# Patient Record
Sex: Female | Born: 1960 | Race: White | Hispanic: No | Marital: Single | Smoking: Never smoker
Health system: Southern US, Community
[De-identification: ages and names within clinical notes are randomized; demographics above are authoritative.]

## PROBLEM LIST (undated history)

## (undated) DIAGNOSIS — F419 Anxiety disorder, unspecified: Secondary | ICD-10-CM

## (undated) DIAGNOSIS — I10 Essential (primary) hypertension: Secondary | ICD-10-CM

## (undated) DIAGNOSIS — K219 Gastro-esophageal reflux disease without esophagitis: Secondary | ICD-10-CM

## (undated) DIAGNOSIS — R011 Cardiac murmur, unspecified: Secondary | ICD-10-CM

## (undated) DIAGNOSIS — T7840XA Allergy, unspecified, initial encounter: Secondary | ICD-10-CM

## (undated) DIAGNOSIS — E7800 Pure hypercholesterolemia, unspecified: Secondary | ICD-10-CM

## (undated) DIAGNOSIS — R945 Abnormal results of liver function studies: Secondary | ICD-10-CM

## (undated) DIAGNOSIS — N189 Chronic kidney disease, unspecified: Secondary | ICD-10-CM

## (undated) DIAGNOSIS — E119 Type 2 diabetes mellitus without complications: Secondary | ICD-10-CM

## (undated) DIAGNOSIS — R739 Hyperglycemia, unspecified: Secondary | ICD-10-CM

## (undated) DIAGNOSIS — D649 Anemia, unspecified: Secondary | ICD-10-CM

## (undated) DIAGNOSIS — F32A Depression, unspecified: Secondary | ICD-10-CM

## (undated) HISTORY — PX: BREAST BIOPSY: SHX20

## (undated) HISTORY — DX: Hyperglycemia, unspecified: R73.9

## (undated) HISTORY — DX: Pure hypercholesterolemia, unspecified: E78.00

## (undated) HISTORY — DX: Gastro-esophageal reflux disease without esophagitis: K21.9

## (undated) HISTORY — DX: Anxiety disorder, unspecified: F41.9

## (undated) HISTORY — DX: Anemia, unspecified: D64.9

## (undated) HISTORY — DX: Depression, unspecified: F32.A

## (undated) HISTORY — PX: SPINE SURGERY: SHX786

## (undated) HISTORY — PX: BREAST EXCISIONAL BIOPSY: SUR124

## (undated) HISTORY — DX: Essential (primary) hypertension: I10

## (undated) HISTORY — DX: Cardiac murmur, unspecified: R01.1

## (undated) HISTORY — DX: Chronic kidney disease, unspecified: N18.9

## (undated) HISTORY — DX: Allergy, unspecified, initial encounter: T78.40XA

## (undated) HISTORY — DX: Abnormal results of liver function studies: R94.5

## (undated) HISTORY — DX: Type 2 diabetes mellitus without complications: E11.9

---
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
## 1988-09-15 HISTORY — PX: SEPTOPLASTY: SUR1290

## 2002-09-15 HISTORY — PX: LUMBAR MICRODISCECTOMY: SHX99

## 2003-02-06 ENCOUNTER — Encounter: Payer: Self-pay | Admitting: Neurological Surgery

## 2003-02-06 ENCOUNTER — Ambulatory Visit (HOSPITAL_COMMUNITY): Admission: RE | Admit: 2003-02-06 | Discharge: 2003-02-07 | Payer: Self-pay | Admitting: Neurological Surgery

## 2004-06-17 ENCOUNTER — Ambulatory Visit: Payer: Self-pay | Admitting: Internal Medicine

## 2004-09-15 HISTORY — PX: ROTATOR CUFF REPAIR: SHX139

## 2004-10-17 ENCOUNTER — Ambulatory Visit: Payer: Self-pay | Admitting: Internal Medicine

## 2005-01-27 ENCOUNTER — Ambulatory Visit (HOSPITAL_COMMUNITY): Admission: RE | Admit: 2005-01-27 | Discharge: 2005-01-27 | Payer: Self-pay | Admitting: Orthopedic Surgery

## 2005-01-27 ENCOUNTER — Ambulatory Visit (HOSPITAL_BASED_OUTPATIENT_CLINIC_OR_DEPARTMENT_OTHER): Admission: RE | Admit: 2005-01-27 | Discharge: 2005-01-27 | Payer: Self-pay | Admitting: Orthopedic Surgery

## 2005-06-24 ENCOUNTER — Ambulatory Visit: Payer: Self-pay | Admitting: Internal Medicine

## 2005-09-18 ENCOUNTER — Ambulatory Visit: Payer: Self-pay | Admitting: Gastroenterology

## 2006-01-08 ENCOUNTER — Ambulatory Visit: Payer: Self-pay | Admitting: Internal Medicine

## 2006-07-01 ENCOUNTER — Ambulatory Visit: Payer: Self-pay | Admitting: General Surgery

## 2006-07-14 ENCOUNTER — Ambulatory Visit: Payer: Self-pay | Admitting: General Surgery

## 2006-07-30 HISTORY — PX: BREAST BIOPSY: SHX20

## 2006-09-15 HISTORY — PX: BREAST SURGERY: SHX581

## 2007-02-01 ENCOUNTER — Ambulatory Visit: Payer: Self-pay | Admitting: General Surgery

## 2007-02-26 ENCOUNTER — Ambulatory Visit: Payer: Self-pay | Admitting: General Surgery

## 2007-03-05 ENCOUNTER — Ambulatory Visit: Payer: Self-pay | Admitting: General Surgery

## 2007-07-20 ENCOUNTER — Ambulatory Visit: Payer: Self-pay | Admitting: General Surgery

## 2007-09-16 HISTORY — PX: ACHILLES TENDON REPAIR: SUR1153

## 2008-07-20 ENCOUNTER — Ambulatory Visit: Payer: Self-pay | Admitting: Internal Medicine

## 2008-09-15 HISTORY — PX: ABDOMINAL HYSTERECTOMY: SHX81

## 2009-03-16 ENCOUNTER — Ambulatory Visit: Payer: Self-pay | Admitting: Internal Medicine

## 2009-08-10 ENCOUNTER — Ambulatory Visit: Payer: Self-pay | Admitting: Internal Medicine

## 2010-05-16 ENCOUNTER — Ambulatory Visit: Payer: Self-pay | Admitting: Internal Medicine

## 2010-07-23 ENCOUNTER — Ambulatory Visit: Payer: Self-pay | Admitting: Obstetrics and Gynecology

## 2010-07-29 ENCOUNTER — Ambulatory Visit: Payer: Self-pay | Admitting: Obstetrics and Gynecology

## 2010-07-30 LAB — PATHOLOGY REPORT

## 2010-09-03 ENCOUNTER — Ambulatory Visit: Payer: Self-pay | Admitting: Internal Medicine

## 2011-04-25 ENCOUNTER — Ambulatory Visit: Payer: Self-pay | Admitting: Gastroenterology

## 2011-09-11 ENCOUNTER — Ambulatory Visit: Payer: Self-pay | Admitting: Internal Medicine

## 2011-09-29 ENCOUNTER — Ambulatory Visit: Payer: Self-pay | Admitting: Internal Medicine

## 2012-06-18 ENCOUNTER — Ambulatory Visit (INDEPENDENT_AMBULATORY_CARE_PROVIDER_SITE_OTHER): Payer: BC Managed Care – PPO | Admitting: Internal Medicine

## 2012-06-18 ENCOUNTER — Encounter: Payer: Self-pay | Admitting: Internal Medicine

## 2012-06-18 VITALS — BP 118/68 | HR 100 | Temp 98.8°F | Ht 63.0 in | Wt 240.5 lb

## 2012-06-18 DIAGNOSIS — R748 Abnormal levels of other serum enzymes: Secondary | ICD-10-CM

## 2012-06-18 DIAGNOSIS — I10 Essential (primary) hypertension: Secondary | ICD-10-CM

## 2012-06-18 DIAGNOSIS — Z0000 Encounter for general adult medical examination without abnormal findings: Secondary | ICD-10-CM

## 2012-06-18 DIAGNOSIS — R739 Hyperglycemia, unspecified: Secondary | ICD-10-CM

## 2012-06-18 DIAGNOSIS — J329 Chronic sinusitis, unspecified: Secondary | ICD-10-CM

## 2012-06-18 DIAGNOSIS — E7800 Pure hypercholesterolemia, unspecified: Secondary | ICD-10-CM

## 2012-06-18 DIAGNOSIS — R7309 Other abnormal glucose: Secondary | ICD-10-CM

## 2012-06-18 DIAGNOSIS — R945 Abnormal results of liver function studies: Secondary | ICD-10-CM

## 2012-06-18 DIAGNOSIS — K769 Liver disease, unspecified: Secondary | ICD-10-CM

## 2012-06-18 MED ORDER — SULFAMETHOXAZOLE-TMP DS 800-160 MG PO TABS: 1.0000 | ORAL_TABLET | Freq: Two times a day (BID) | ORAL | Status: DC

## 2012-06-18 MED ORDER — LISINOPRIL 10 MG PO TABS: 10.0000 mg | ORAL_TABLET | Freq: Every day | ORAL | Status: DC

## 2012-06-18 MED ORDER — ATORVASTATIN CALCIUM 10 MG PO TABS: 10.0000 mg | ORAL_TABLET | Freq: Every day | ORAL | Status: DC

## 2012-06-18 NOTE — Patient Instructions (Signed)
It was good seeing you today.  I am going to give you an antibiotic (Bactrim) to take 2x/day.  Use Flonase Nasal spray daily as directed.  Flush with saline nasal spray at least 2-3x/day.  Mucinex in the am and Plain Robitussin in the evening.  Rest.  Fluids.  If symptoms do not resolve, let me know.

## 2012-06-20 ENCOUNTER — Encounter: Payer: Self-pay | Admitting: Internal Medicine

## 2012-06-20 DIAGNOSIS — I10 Essential (primary) hypertension: Secondary | ICD-10-CM | POA: Insufficient documentation

## 2012-06-20 NOTE — Progress Notes (Signed)
  Subjective:    [name], female    DOB: 1960-10-21, 51 y.o.   MRN: [id number]  HPI 51 year old female with past history of hypertension, hypercholesterolemia and abnormal liver function who comes in today for a scheduled follow up.  She states she has been doing relatively well overall.  She has been having some allergy issues over the last two months.  Taking Claritin.  Over the last couple of days, she has developed a sore throat, increased nasal congestion and increased sinus pressure.  Fever - Tmax (100.5).  Yellow/green nasal mucus production.  Minimal cough.  No deep chest congestion.  No chest tightness, sob or wheezing.  She has been flushing her nose with saline.    Review of Systems Patient denies any headache, lightheadedness or dizziness.  She does report some increased sinus pressure and nasal congestion - productive of green mucus.  Symptoms consistent with her previous sinus infections.  Some ear fullness.  No sore throat.  No chest pain, tightness or palpatations.  No increased shortness of breath or chest congestion.  Minimal cough.  No nausea or vomiting.  No abdominal pain or cramping.  No bowel change, such as diarrhea, constipation, BRBPR or melana.  No urine change.      Past Medical History  Diagnosis Date  . Asthma   . Depression   . Allergy   . Heart murmur   . Elevated blood pressure   . Hyperlipidemia   . Urinary tract infection   . GERD (gastroesophageal reflux disease)        Objective:   Physical Exam Filed Vitals:   06/18/12 0843  BP: 118/68  Pulse: 100  Temp: 98.8 F (52.50 C)   51 year old female in no acute distress.   HEENT:  Nares - clear except slightly erythematous turbinates.  OP- without lesions or erythema.  Minimal maxillary sinus pressure - with palpation.   NECK:  Supple, nontender.  No audible carotid bruit.   HEART:  Appears to be regular. LUNGS:  Without crackles or wheezing audible.  Respirations even and  unlabored.   RADIAL PULSE:  Equal bilaterally.  ABDOMEN:  Soft, nontender.  No audible abdominal bruit.   EXTREMITIES:  No increased edema to be present.                     Assessment & Plan:  SINUSITIS.  Treat with Bactrim DS bid x 10 days.  Saline nasal spray and Flonase nasal spray as directed.  Mucinex in the am and Robitussin in the evening.  Rest.  Fluids.  Explained to her if symptoms changed, worsened or did not resolve - she was to be reevaluated.    INCREASED PSYCHOSOCIAL STRESSORS.  Doing well.  Follow.

## 2012-06-20 NOTE — Assessment & Plan Note (Signed)
Well controlled.  Continue same meds.  Follow metabolic panel.

## 2012-06-21 ENCOUNTER — Encounter: Payer: Self-pay | Admitting: Internal Medicine

## 2012-06-21 DIAGNOSIS — E119 Type 2 diabetes mellitus without complications: Secondary | ICD-10-CM | POA: Insufficient documentation

## 2012-06-21 DIAGNOSIS — E7800 Pure hypercholesterolemia, unspecified: Secondary | ICD-10-CM | POA: Insufficient documentation

## 2012-06-21 DIAGNOSIS — R945 Abnormal results of liver function studies: Secondary | ICD-10-CM | POA: Insufficient documentation

## 2012-06-21 DIAGNOSIS — E1165 Type 2 diabetes mellitus with hyperglycemia: Secondary | ICD-10-CM | POA: Insufficient documentation

## 2012-06-21 DIAGNOSIS — K7689 Other specified diseases of liver: Secondary | ICD-10-CM | POA: Insufficient documentation

## 2012-06-21 NOTE — Assessment & Plan Note (Signed)
Review notes to see when last liver panel obtained.  Follow.

## 2012-06-21 NOTE — Assessment & Plan Note (Signed)
Low cholesterol diet and exercise.  Recent lipid profile revealed total cholesterol 176, HDL 43, LDL 103 and triglycerides 153.  Follow.

## 2012-06-21 NOTE — Assessment & Plan Note (Signed)
Low carb diet, exercise and weight loss.  Recent a1c 6.4.  Follow.

## 2012-06-24 ENCOUNTER — Encounter: Payer: Self-pay | Admitting: Internal Medicine

## 2012-06-24 MED ORDER — SULFAMETHOXAZOLE-TRIMETHOPRIM 800-160 MG PO TABS: 1.0000 | ORAL_TABLET | Freq: Two times a day (BID) | ORAL | Status: DC

## 2012-06-24 NOTE — Telephone Encounter (Signed)
I prescribed another one week of bactrim - rx sent to [facility]

## 2012-08-17 ENCOUNTER — Encounter: Payer: Self-pay | Admitting: Internal Medicine

## 2012-08-17 DIAGNOSIS — I10 Essential (primary) hypertension: Secondary | ICD-10-CM

## 2012-08-20 ENCOUNTER — Encounter: Payer: Self-pay | Admitting: Internal Medicine

## 2012-08-20 ENCOUNTER — Other Ambulatory Visit (HOSPITAL_COMMUNITY)
Admission: RE | Admit: 2012-08-20 | Discharge: 2012-08-20 | Disposition: A | Discharge status: Home / Self Care | Payer: BC Managed Care – PPO | Source: Ambulatory Visit | Attending: Internal Medicine | Admitting: Internal Medicine

## 2012-08-20 ENCOUNTER — Ambulatory Visit (INDEPENDENT_AMBULATORY_CARE_PROVIDER_SITE_OTHER): Payer: BC Managed Care – PPO | Admitting: Internal Medicine

## 2012-08-20 VITALS — BP 120/70 | HR 82 | Temp 98.2°F | Ht 62.0 in | Wt 232.2 lb

## 2012-08-20 DIAGNOSIS — K769 Liver disease, unspecified: Secondary | ICD-10-CM

## 2012-08-20 DIAGNOSIS — R7309 Other abnormal glucose: Secondary | ICD-10-CM

## 2012-08-20 DIAGNOSIS — E7800 Pure hypercholesterolemia, unspecified: Secondary | ICD-10-CM

## 2012-08-20 DIAGNOSIS — R739 Hyperglycemia, unspecified: Secondary | ICD-10-CM

## 2012-08-20 DIAGNOSIS — I10 Essential (primary) hypertension: Secondary | ICD-10-CM

## 2012-08-20 DIAGNOSIS — R7989 Other specified abnormal findings of blood chemistry: Secondary | ICD-10-CM

## 2012-08-20 DIAGNOSIS — Z139 Encounter for screening, unspecified: Secondary | ICD-10-CM

## 2012-08-20 DIAGNOSIS — R5383 Other fatigue: Secondary | ICD-10-CM

## 2012-08-20 DIAGNOSIS — D649 Anemia, unspecified: Secondary | ICD-10-CM | POA: Insufficient documentation

## 2012-08-20 DIAGNOSIS — Z01419 Encounter for gynecological examination (general) (routine) without abnormal findings: Secondary | ICD-10-CM | POA: Insufficient documentation

## 2012-08-20 DIAGNOSIS — R945 Abnormal results of liver function studies: Secondary | ICD-10-CM

## 2012-08-20 DIAGNOSIS — Z1151 Encounter for screening for human papillomavirus (HPV): Secondary | ICD-10-CM | POA: Insufficient documentation

## 2012-08-20 DIAGNOSIS — R5381 Other malaise: Secondary | ICD-10-CM

## 2012-08-20 LAB — CBC WITH DIFFERENTIAL/PLATELET
Basophils Absolute: 0 10*3/uL (ref 0.0–0.1)
Basophils Relative: 0.9 % (ref 0.0–3.0)
Eosinophils Absolute: 0.1 10*3/uL (ref 0.0–0.7)
Hemoglobin: 14.8 g/dL (ref 12.0–15.0)
MCHC: 33.1 g/dL (ref 30.0–36.0)
MCV: 84.6 fl (ref 78.0–100.0)
Monocytes Absolute: 0.3 10*3/uL (ref 0.1–1.0)
Neutro Abs: 2.6 10*3/uL (ref 1.4–7.7)
RBC: 5.29 Mil/uL — ABNORMAL HIGH (ref 3.87–5.11)
RDW: 13.7 % (ref 11.5–14.6)

## 2012-08-20 LAB — BASIC METABOLIC PANEL
BUN: 16 mg/dL (ref 6–23)
Calcium: 9.5 mg/dL (ref 8.4–10.5)
Creatinine, Ser: 0.9 mg/dL (ref 0.4–1.2)
GFR: 70.85 mL/min (ref >60.00)

## 2012-08-20 LAB — HEMOGLOBIN A1C: Hgb A1c MFr Bld: 6.1 % (ref 4.6–6.5)

## 2012-08-20 LAB — HEPATIC FUNCTION PANEL
Albumin: 4.5 g/dL (ref 3.5–5.2)
Alkaline Phosphatase: 62 U/L (ref 39–117)
Total Protein: 7.7 g/dL (ref 6.0–8.3)

## 2012-08-20 LAB — LIPID PANEL
Cholesterol: 184 mg/dL (ref 0–200)
HDL: 38.6 mg/dL — ABNORMAL LOW (ref >39.00)
Triglycerides: 133 mg/dL (ref 0.0–149.0)

## 2012-08-20 LAB — TSH: TSH: 1.67 u[IU]/mL (ref 0.35–5.50)

## 2012-08-20 NOTE — Assessment & Plan Note (Signed)
On iron.  Follow cbc and ferritin.

## 2012-08-20 NOTE — Patient Instructions (Addendum)
It was good seeing you today.  Let me know if you need anything.

## 2012-08-20 NOTE — Progress Notes (Signed)
Subjective:    [name], female    DOB: 11-06-1960, 51 y.o.   MRN: [id number]  HPI 51 year old female with past history of hypertension, hypercholesterolemia and abnormal liver function who comes in today to follow up on these issues as well as for a complete physical exam.  States she is doing relatively well.  Mother is declining and she reports increased stress related to this.  Feels she is handling things relatively well.  Eating and drinking well.  No nausea or vomiting.  Bowels stable.   Past Medical History  Diagnosis Date  . Abnormal liver function   . Allergy   . Heart murmur   . Hypertension   . Hypercholesterolemia   . GERD (gastroesophageal reflux disease)   . Hyperglycemia   . Anemia     Current Outpatient Prescriptions on File Prior to Visit  Medication Sig Dispense Refill  . adapalene (DIFFERIN) 0.1 % gel Apply 0.3 % topically at bedtime.      Marland Kitchen atorvastatin (LIPITOR) 10 MG tablet Take 1 tablet (10 mg total) by mouth daily.  90 tablet  3  . Benzoyl Peroxide (BENZEFOAM EX) Apply topically.      . citalopram (CELEXA) 20 MG tablet Take 1/2 a day      . Clobetasol Propionate (CLOBEX SPRAY EX) Apply topically.      Marland Kitchen doxycycline (PERIOSTAT) 20 MG tablet Take 20 mg by mouth 2 (two) times daily.      Marland Kitchen lisinopril (PRINIVIL,ZESTRIL) 10 MG tablet Take 1 tablet (10 mg total) by mouth daily.  90 tablet  3  . vitamin E 400 UNIT capsule Take 400 Units by mouth daily.      Marland Kitchen loratadine (CLARITIN) 10 MG tablet Take 10 mg by mouth daily.        Review of Systems Patient denies any headache, lightheadedness or dizziness.  No sinus or allergy symptoms.  Previous sinus infection - cleared.  No chest pain, tightness or palpitations.  No increased shortness of breath, cough or congestion.  No nausea or vomiting.  No abdominal pain or cramping.  No bowel change, such as diarrhea, constipation, BRBPR or melana.  No urine change.        Objective:   Physical Exam Filed  Vitals:   08/20/12 1001  BP: 120/70  Pulse: 82  Temp: 98.2 F (36.8 C)   Blood pressure recheck:  44/71  51 year old female in no acute distress.   HEENT:  Nares- clear.  Oropharynx - without lesions. NECK:  Supple.  Nontender.  No audible bruit.  HEART:  Appears to be regular. LUNGS:  No crackles or wheezing audible.  Respirations even and unlabored.  RADIAL PULSE:  Equal bilaterally.    BREASTS:  No nipple discharge or nipple retraction present.  Could not appreciate any distinct nodules or axillary adenopathy.  ABDOMEN:  Soft, nontender.  Bowel sounds present and normal.  No audible abdominal bruit.  GU:  Normal external genitalia.  Vaginal vault without lesions.  S/p hysterectomy.  Pap performed. Could not appreciate any adnexal masses or tenderness.   RECTAL:  Heme negative.   EXTREMITIES:  No increased edema present.  DP pulses palpable and equal bilaterally.          Assessment & Plan:  INCREASED PSYCHOSOCIAL STRESSORS.  Doing well on Celexa.  Increased stress dealing with her mother's medical issues.  Does not feel she needs anything more at this point.  Follow.    GYN.  S/p [place] and doing well.  Released by Dr Amalia Hailey.    CARDIOVASCULAR.  Asymptomatic.  Continue risk factor modification.   PREVIOUS HEMATURIA.  U/A 09/18/11 revealed no rbc's.  Follow.    HEALTH MAINTENANCE.  Physical today.  Mammogram 09/11/11 recommended follow up views.  Follow up views 09/19/11 - BiRADS II.  Schedule a follow up mammogram.

## 2012-08-21 ENCOUNTER — Encounter: Payer: Self-pay | Admitting: Internal Medicine

## 2012-08-21 ENCOUNTER — Telehealth: Payer: Self-pay | Admitting: Internal Medicine

## 2012-08-21 DIAGNOSIS — R7989 Other specified abnormal findings of blood chemistry: Secondary | ICD-10-CM

## 2012-08-21 DIAGNOSIS — R945 Abnormal results of liver function studies: Secondary | ICD-10-CM

## 2012-08-21 DIAGNOSIS — D72819 Decreased white blood cell count, unspecified: Secondary | ICD-10-CM

## 2012-08-21 NOTE — Assessment & Plan Note (Signed)
Blood pressure under good control.  Continue same medication.  Check metabolic panel.

## 2012-08-21 NOTE — Telephone Encounter (Signed)
I sent Carly Fields a my chart message regarding her lab results.  She needs nonfasting labs scheduled in 2 months.  Please make her a lab appt and send to her.  She has not been scheduled an appt.  I will place order for the labs.  Thanks.

## 2012-08-21 NOTE — Assessment & Plan Note (Signed)
Follows with Dr Donnella Sham.  Declined liver biopsy.  Check liver panel.

## 2012-08-21 NOTE — Assessment & Plan Note (Signed)
On Lipitor.  Low cholesterol diet and exercise.  Check lipid panel and liver function.

## 2012-08-21 NOTE — Assessment & Plan Note (Addendum)
Low carb diet.  Check metabolic panel and H9V.

## 2012-08-24 NOTE — Telephone Encounter (Signed)
Made appointment and mailed appointment card to pt 12/10

## 2012-09-13 ENCOUNTER — Ambulatory Visit: Payer: Self-pay | Admitting: Internal Medicine

## 2012-09-15 ENCOUNTER — Encounter: Payer: Self-pay | Admitting: Internal Medicine

## 2012-10-05 ENCOUNTER — Encounter: Payer: Self-pay | Admitting: Internal Medicine

## 2012-10-27 ENCOUNTER — Telehealth: Payer: Self-pay | Admitting: Internal Medicine

## 2012-10-27 ENCOUNTER — Other Ambulatory Visit (INDEPENDENT_AMBULATORY_CARE_PROVIDER_SITE_OTHER): Payer: BC Managed Care – PPO

## 2012-10-27 DIAGNOSIS — D72819 Decreased white blood cell count, unspecified: Secondary | ICD-10-CM

## 2012-10-27 DIAGNOSIS — R945 Abnormal results of liver function studies: Secondary | ICD-10-CM

## 2012-10-27 DIAGNOSIS — R7989 Other specified abnormal findings of blood chemistry: Secondary | ICD-10-CM

## 2012-10-27 LAB — CBC WITH DIFFERENTIAL/PLATELET
Basophils Absolute: 0 10*3/uL (ref 0.0–0.1)
Eosinophils Relative: 2.8 % (ref 0.0–5.0)
HCT: 40.5 % (ref 36.0–46.0)
Lymphocytes Relative: 26 % (ref 12.0–46.0)
Lymphs Abs: 1.3 10*3/uL (ref 0.7–4.0)
Monocytes Relative: 8.1 % (ref 3.0–12.0)
Neutrophils Relative %: 62.4 % (ref 43.0–77.0)
Platelets: 230 10*3/uL (ref 150.0–400.0)
RDW: 13.4 % (ref 11.5–14.6)
WBC: 4.9 10*3/uL (ref 4.5–10.5)

## 2012-10-27 LAB — HEPATIC FUNCTION PANEL
ALT: 75 U/L — ABNORMAL HIGH (ref 0–35)
AST: 48 U/L — ABNORMAL HIGH (ref 0–37)
Alkaline Phosphatase: 61 U/L (ref 39–117)
Bilirubin, Direct: 0.1 mg/dL (ref 0.0–0.3)
Total Bilirubin: 0.5 mg/dL (ref 0.3–1.2)

## 2012-10-27 NOTE — Telephone Encounter (Signed)
Pt notified of labs via my chart messaging.

## 2012-10-30 ENCOUNTER — Other Ambulatory Visit: Payer: Self-pay

## 2012-11-24 ENCOUNTER — Telehealth: Payer: Self-pay | Admitting: Internal Medicine

## 2012-11-24 ENCOUNTER — Encounter: Payer: Self-pay | Admitting: Adult Health

## 2012-11-24 ENCOUNTER — Ambulatory Visit (INDEPENDENT_AMBULATORY_CARE_PROVIDER_SITE_OTHER): Payer: BC Managed Care – PPO | Admitting: Adult Health

## 2012-11-24 VITALS — BP 124/80 | HR 78 | Temp 98.9°F | Resp 16 | Wt 237.5 lb

## 2012-11-24 DIAGNOSIS — L409 Psoriasis, unspecified: Secondary | ICD-10-CM | POA: Insufficient documentation

## 2012-11-24 DIAGNOSIS — J329 Chronic sinusitis, unspecified: Secondary | ICD-10-CM

## 2012-11-24 MED ORDER — CEFUROXIME AXETIL 250 MG PO TABS: 250.0000 mg | ORAL_TABLET | Freq: Two times a day (BID) | ORAL | Status: DC

## 2012-11-24 NOTE — Progress Notes (Signed)
  Subjective:    [name], female    DOB: [birth year], 52 y.o.   MRN: [id number]  HPI  Patient is a pleasant 52 y/o female who presents to clinic with upper respiratory symptoms. She began having symptoms on Sunday consisting of cough, congestion, sinus pressure, sore throat, fever. She has been trying over-the-counter Mucinex day and night but does not feel that this is helping. She denies shortness of breath, wheezing or chest pain.  Current Outpatient Prescriptions on File Prior to Visit  Medication Sig Dispense Refill  . adapalene (DIFFERIN) 0.1 % gel Apply 0.3 % topically at bedtime.      Marland Kitchen atorvastatin (LIPITOR) 10 MG tablet Take 1 tablet (10 mg total) by mouth daily.  90 tablet  3  . Benzoyl Peroxide (BENZEFOAM EX) Apply topically.      . Clobetasol Propionate (CLOBEX SPRAY EX) Apply topically daily as needed.       . doxycycline (PERIOSTAT) 20 MG tablet Take 20 mg by mouth 2 (two) times daily.      Marland Kitchen lisinopril (PRINIVIL,ZESTRIL) 10 MG tablet Take 1 tablet (10 mg total) by mouth daily.  90 tablet  3  . vitamin E 400 UNIT capsule Take 400 Units by mouth daily.      . citalopram (CELEXA) 20 MG tablet Take 1/2 a day      . loratadine (CLARITIN) 10 MG tablet Take 10 mg by mouth daily.       No current facility-administered medications on file prior to visit.     Review of Systems  Constitutional: Positive for fever and chills.  HENT: Positive for congestion, sore throat, rhinorrhea, postnasal drip and sinus pressure.        Green drainage  Respiratory: Positive for cough. Negative for shortness of breath and wheezing.   Cardiovascular: Negative for chest pain.  Gastrointestinal: Negative for nausea, vomiting and diarrhea.   BP 124/80  Pulse 78  Temp(Src) 98.9 F (37.2 C) (Oral)  Resp 16  Wt 237 lb 8 oz (107.729 kg)  BMI 43.43 kg/m2  SpO2 98%     Objective:   Physical Exam  Constitutional: She is oriented to person, place, and time. She appears  well-developed and well-nourished. No distress.  Sick appearing. She is wearing a face mask. Patient sounds very nasally.  HENT:  Head: Normocephalic and atraumatic.  Pharyngeal erythema without exudate  Cardiovascular: Normal rate, regular rhythm, normal heart sounds and intact distal pulses.  Exam reveals no gallop.   No murmur heard. Pulmonary/Chest: Effort normal and breath sounds normal. No respiratory distress. She has no wheezes. She has no rales.  Lymphadenopathy:    She has cervical adenopathy.  Neurological: She is alert and oriented to person, place, and time.  Skin: Skin is warm and dry.  Psychiatric: She has a normal mood and affect. Her behavior is normal. Judgment and thought content normal.          Assessment & Plan:

## 2012-11-24 NOTE — Telephone Encounter (Signed)
Patient Information:  Caller Name: Elveta  Phone: [phone]  Patient: Carly Fields, Harding  Gender: Female  DOB: 1961/08/20  Age: 52 Years  PCP: Einar Pheasant  Pregnant: No  Office Follow Up:  Does the office need to follow up with this patient?: No  Instructions For The Office: N/A   Symptoms  Reason For Call & Symptoms: Patient states she has sore throat and sinus pressure and pain and cough.  Reviewed Health History In EMR: Yes  Reviewed Medications In EMR: Yes  Reviewed Allergies In EMR: Yes  Reviewed Surgeries / Procedures: Yes  Date of Onset of Symptoms: 11/21/2012  Any Fever: Yes  Fever Taken: Oral  Fever Time Of Reading: 07:45:00  Fever Last Reading: 99.9 OB / GYN:  LMP: Unknown  Guideline(s) Used:  Sinus Pain and Congestion  Disposition Per Guideline:   See Today in Office  Reason For Disposition Reached:   Sinus pain (not just congestion) and fever  Advice Given:  N/A  Appointment Scheduled:  11/24/2012 11:15:00 Appointment Scheduled Provider:  Charolette Forward

## 2012-11-24 NOTE — Patient Instructions (Addendum)
Please start your Ceftin today. You will take this for 10 days.  Also, resume your flonase at bedtime - 2 sprays in each nostril. You can also use afrin for severe congestion for 3 days only.   Below is some information on your condition:  What is sinusitis? - Sinusitis is a condition that can cause a stuffy nose, pain in the face, and yellow or green discharge (mucus) from the nose. The sinuses are hollow areas in the bones of the face. They have a thin lining that normally makes a small amount of mucus. When this lining gets infected, it swells and makes extra mucus. This causes symptoms.   Sinusitis can occur when a person gets sick with a cold. The germs causing the cold can also infect the sinuses. Many times, a person feels like his or her cold is getting better. But then he or she gets sinusitis and begins to feel sick again.  What are the symptoms of sinusitis? - Common symptoms of sinusitis include: Stuffy or blocked nose  Thick yellow or green discharge from the nose  Pain in the teeth  Pain or pressure in the face - This often feels worse when a person bends forward.   People with sinusitis can also have other symptoms that include: Fever  Cough  Trouble smelling  Ear pressure or fullness  Headache  Bad breath  Feeling tired   Most of the time, symptoms start to improve in 7 to 10 days.  Should I see a doctor or nurse? - See your doctor or nurse if your symptoms last more than 7 days, or if your symptoms get better at first but then get worse. Sometimes, sinusitis can lead to serious problems. See your doctor or nurse right away (do not wait 7 days) if you have: Fever higher than 102.1F (39.2C)  Sudden and severe pain in the face and head  Trouble seeing or seeing double  Trouble thinking clearly  Swelling or redness around 1 or both eyes  Trouble breathing or a stiff neck   Is there anything I can do on my own to feel better? - Yes. To reduce your symptoms, you  can: Take an over-the-counter pain reliever to reduce the pain  Rinse your nose and sinuses with salt water a few times a day - Ask your doctor or nurse about the best way to do this.  Use a decongestant nose spray - These sprays are sold in a pharmacy. But do not use decongestant nose sprays for more than 2 to 3 days in a row. Using them more than 3 days in a row can make symptoms worse.   You should NOT take an antihistamine for sinusitis. Common antihistamines include diphenhydramine (sample brand name: Benadryl), chlorpheniramine (sample brand name: Chlor-Trimeton), loratadine (sample brand name: Claritin), and cetirizine (sample brand name: Zyrtec). They can treat allergies, but not sinus infections, and could increase your discomfort by drying the lining of your nose and sinuses, or making you tired.   Your doctor might also prescribe a steroid nose spray to reduce the swelling in your nose. (Steroid nose sprays do not contain the same steroids that athletes take to build muscle.)  How is sinusitis treated? - Most of the time, sinusitis does not need to be treated with antibiotic medicines. This is because most sinusitis is caused by viruses - not bacteria - and antibiotics do not kill viruses. Many people get over sinus infections without antibiotics.  Some people with sinusitis  do need treatment with antibiotics. If your symptoms have not improved after 7 to 10 days, ask your doctor if you should take antibiotics. Your doctor might recommend that you wait 1 more week to see if your symptoms improve. But if you have symptoms such as a fever or a lot of pain, he or she might prescribe antibiotics. It is important to follow your doctor's instructions about taking your antibiotics.

## 2012-11-24 NOTE — Assessment & Plan Note (Signed)
Patient with symptoms suggestive of bacterial infection. Purulent drainage, fever, general malaise. She is allergic to penicillins. She says that she is able to take Ceftin without any problems. Start Ceftin 250 mg twice a day for 10 days. She can also use Flonase nasal spray 2 sprays in each nostril daily. Afrin for severe congestion only for 3 days. Patient can use over-the-counter cough medication such as Robitussin or Delsym. Return to clinic if symptoms do not improve by Monday.

## 2012-12-09 ENCOUNTER — Encounter: Payer: Self-pay | Admitting: Internal Medicine

## 2012-12-11 ENCOUNTER — Telehealth: Payer: Self-pay | Admitting: Internal Medicine

## 2012-12-11 NOTE — Telephone Encounter (Signed)
Response to [employer].

## 2012-12-27 ENCOUNTER — Encounter: Payer: Self-pay | Admitting: Internal Medicine

## 2012-12-27 ENCOUNTER — Ambulatory Visit (INDEPENDENT_AMBULATORY_CARE_PROVIDER_SITE_OTHER): Payer: BC Managed Care – PPO | Admitting: Internal Medicine

## 2012-12-27 VITALS — BP 126/66 | HR 84 | Temp 98.6°F | Resp 18 | Wt 237.2 lb

## 2012-12-27 DIAGNOSIS — R7309 Other abnormal glucose: Secondary | ICD-10-CM

## 2012-12-27 DIAGNOSIS — J329 Chronic sinusitis, unspecified: Secondary | ICD-10-CM

## 2012-12-27 DIAGNOSIS — R739 Hyperglycemia, unspecified: Secondary | ICD-10-CM

## 2012-12-27 DIAGNOSIS — D649 Anemia, unspecified: Secondary | ICD-10-CM

## 2012-12-27 DIAGNOSIS — K769 Liver disease, unspecified: Secondary | ICD-10-CM

## 2012-12-27 DIAGNOSIS — R945 Abnormal results of liver function studies: Secondary | ICD-10-CM

## 2012-12-27 DIAGNOSIS — I10 Essential (primary) hypertension: Secondary | ICD-10-CM

## 2012-12-27 DIAGNOSIS — E7800 Pure hypercholesterolemia, unspecified: Secondary | ICD-10-CM

## 2012-12-27 NOTE — Assessment & Plan Note (Signed)
Resolved.  No symptoms today.

## 2012-12-27 NOTE — Assessment & Plan Note (Signed)
Blood pressure under good control.  Continue same medication.  Check metabolic panel with next labs.

## 2012-12-27 NOTE — Assessment & Plan Note (Signed)
On Lipitor.  Low cholesterol diet and exercise.  Check lipid panel and liver function with next labs.

## 2012-12-27 NOTE — Assessment & Plan Note (Signed)
Low carb diet.  Check metabolic panel and P9I.

## 2012-12-27 NOTE — Progress Notes (Signed)
Subjective:    [name], female    DOB: 02/02/1961, 52 y.o.   MRN: [id number]  HPI 52 year old female with past history of hypertension, hypercholesterolemia and abnormal liver function who comes in today for a scheduled follow up.  States she is doing relatively well.  Mother has been sick and she reports increased stress related to this.  She also is having increased stress with her job.  Is losing her job.  Having to look for another job.  Feels she is handling things relatively well.  Eating and drinking well.  No nausea or vomiting.  Bowels stable.  No sinus or allergy symptoms now.     Past Medical History  Diagnosis Date  . Abnormal liver function   . Allergy   . Heart murmur   . Hypertension   . Hypercholesterolemia   . GERD (gastroesophageal reflux disease)   . Hyperglycemia   . Anemia     Current Outpatient Prescriptions on File Prior to Visit  Medication Sig Dispense Refill  . adapalene (DIFFERIN) 0.1 % gel Apply 0.3 % topically at bedtime.      Marland Kitchen atorvastatin (LIPITOR) 10 MG tablet Take 1 tablet (10 mg total) by mouth daily.  90 tablet  3  . Benzoyl Peroxide (BENZEFOAM EX) Apply topically.      . cefUROXime (CEFTIN) 250 MG tablet Take 1 tablet (250 mg total) by mouth 2 (two) times daily.  20 tablet  0  . citalopram (CELEXA) 20 MG tablet Take 1/2 a day      . Clobetasol Propionate (CLOBEX SPRAY EX) Apply topically daily as needed.       . doxycycline (PERIOSTAT) 20 MG tablet Take 20 mg by mouth 2 (two) times daily.      Marland Kitchen lisinopril (PRINIVIL,ZESTRIL) 10 MG tablet Take 1 tablet (10 mg total) by mouth daily.  90 tablet  3  . vitamin E 400 UNIT capsule Take 400 Units by mouth daily.      Marland Kitchen loratadine (CLARITIN) 10 MG tablet Take 10 mg by mouth daily.      Marland Kitchen Phenyleph-Diphenhyd-GG-APAP (MUCINEX SINUS-MAX DAY/NIGHT PO) Take by mouth as directed.       No current facility-administered medications on file prior to visit.    Review of Systems Patient denies  any headache, lightheadedness or dizziness.  No sinus or allergy symptoms.  Previous sinus infection - cleared.  No chest pain, tightness or palpitations.  No increased shortness of breath, cough or congestion.  No nausea or vomiting.  No acid reflux.  No abdominal pain or cramping.  No bowel change, such as diarrhea, constipation, BRBPR or melana.  No urine change.  Handling stress relatively well.      Objective:   Physical Exam  Filed Vitals:   12/27/12 0805  BP: 126/66  Pulse: 84  Temp: 98.6 F (37 C)  Resp: 18   Blood pressure recheck:  49/28  52 year old female in no acute distress.   HEENT:  Nares- clear.  Oropharynx - without lesions. NECK:  Supple.  Nontender.  No audible bruit.  HEART:  Appears to be regular. LUNGS:  No crackles or wheezing audible.  Respirations even and unlabored.  RADIAL PULSE:  Equal bilaterally.   ABDOMEN:  Soft, nontender.  Bowel sounds present and normal.  No audible abdominal bruit.  EXTREMITIES:  No increased edema present.  DP pulses palpable and equal bilaterally.          Assessment &  Plan:  INCREASED PSYCHOSOCIAL STRESSORS.  Doing well on Celexa.  Increased stress dealing with her mother's medical issues and her job.   Does not feel she needs anything more at this point.  Follow.    GYN.  S/p [place] and doing well.  Released by Dr Amalia Hailey.    CARDIOVASCULAR.  Asymptomatic.  Continue risk factor modification.   PREVIOUS HEMATURIA.  U/A 09/18/11 revealed no rbc's.  Follow.    HEALTH MAINTENANCE.  Physical 08/20/12.  Mammogram 09/13/12 -  BiRADS II.

## 2012-12-27 NOTE — Assessment & Plan Note (Signed)
Follows with Dr Donnella Sham.  Declined liver biopsy.  Check liver panel with next labs.

## 2012-12-27 NOTE — Assessment & Plan Note (Signed)
On iron.  Follow cbc and ferritin.  Last cbc wnl.

## 2013-01-03 ENCOUNTER — Other Ambulatory Visit: Payer: Self-pay | Admitting: *Deleted

## 2013-01-03 ENCOUNTER — Encounter: Payer: Self-pay | Admitting: *Deleted

## 2013-01-03 MED ORDER — LISINOPRIL 10 MG PO TABS: 10.0000 mg | ORAL_TABLET | Freq: Every day | ORAL | Status: DC

## 2013-01-18 ENCOUNTER — Other Ambulatory Visit (INDEPENDENT_AMBULATORY_CARE_PROVIDER_SITE_OTHER): Payer: BC Managed Care – PPO

## 2013-01-18 DIAGNOSIS — E7800 Pure hypercholesterolemia, unspecified: Secondary | ICD-10-CM

## 2013-01-18 DIAGNOSIS — R7309 Other abnormal glucose: Secondary | ICD-10-CM

## 2013-01-18 DIAGNOSIS — R945 Abnormal results of liver function studies: Secondary | ICD-10-CM

## 2013-01-18 DIAGNOSIS — I10 Essential (primary) hypertension: Secondary | ICD-10-CM

## 2013-01-18 DIAGNOSIS — R739 Hyperglycemia, unspecified: Secondary | ICD-10-CM

## 2013-01-18 DIAGNOSIS — K769 Liver disease, unspecified: Secondary | ICD-10-CM

## 2013-01-18 LAB — BASIC METABOLIC PANEL
BUN: 15 mg/dL (ref 6–23)
Calcium: 9.1 mg/dL (ref 8.4–10.5)
Chloride: 100 mEq/L (ref 96–112)
Creatinine, Ser: 0.8 mg/dL (ref 0.4–1.2)

## 2013-01-18 LAB — LIPID PANEL
Cholesterol: 191 mg/dL (ref 0–200)
LDL Cholesterol: 114 mg/dL — ABNORMAL HIGH (ref 0–99)

## 2013-01-18 LAB — HEPATIC FUNCTION PANEL
AST: 41 U/L — ABNORMAL HIGH (ref 0–37)
Alkaline Phosphatase: 65 U/L (ref 39–117)
Bilirubin, Direct: 0.1 mg/dL (ref 0.0–0.3)
Total Protein: 6.9 g/dL (ref 6.0–8.3)

## 2013-01-19 ENCOUNTER — Telehealth: Payer: Self-pay | Admitting: Internal Medicine

## 2013-01-19 ENCOUNTER — Encounter: Payer: Self-pay | Admitting: Internal Medicine

## 2013-01-19 DIAGNOSIS — E871 Hypo-osmolality and hyponatremia: Secondary | ICD-10-CM

## 2013-01-19 NOTE — Telephone Encounter (Signed)
Appointment 5/15 pt aware

## 2013-01-19 NOTE — Telephone Encounter (Signed)
Pt notified of lab results via my chart.  She needs a non fasting lab appt in 1-2 weeks.  Please call and schedule her an appt date and time.  Thanks.

## 2013-01-27 ENCOUNTER — Encounter: Payer: Self-pay | Admitting: Internal Medicine

## 2013-01-27 ENCOUNTER — Other Ambulatory Visit (INDEPENDENT_AMBULATORY_CARE_PROVIDER_SITE_OTHER): Payer: BC Managed Care – PPO

## 2013-01-27 ENCOUNTER — Telehealth: Payer: Self-pay | Admitting: *Deleted

## 2013-01-27 DIAGNOSIS — E871 Hypo-osmolality and hyponatremia: Secondary | ICD-10-CM

## 2013-01-27 LAB — SODIUM: Sodium: 138 mEq/L (ref 135–145)

## 2013-01-27 NOTE — Telephone Encounter (Signed)
Dr Lovell Sheehan would like to talk with MD before starting patient on generic accutane. Phone [phone]

## 2013-01-27 NOTE — Telephone Encounter (Signed)
Called.  Left message for her to call back.

## 2013-01-28 ENCOUNTER — Encounter: Payer: Self-pay | Admitting: Internal Medicine

## 2013-01-28 NOTE — Telephone Encounter (Signed)
Spoke to Dr Lovell Sheehan and Dr Marton Redwood office.  Waiting for Dr Marton Redwood office to call me back.

## 2013-02-02 ENCOUNTER — Telehealth: Payer: Self-pay | Admitting: *Deleted

## 2013-02-02 NOTE — Telephone Encounter (Signed)
Pt is aware & I left a message to inform Dr. Lovell Sheehan also

## 2013-02-02 NOTE — Telephone Encounter (Signed)
Please notify pt that I had called Dr Gustavo Lah and left word for his recommendations.  I just called again and he is to review everything and get back with me soon.  I will let her know as soon as I hear.

## 2013-02-02 NOTE — Telephone Encounter (Signed)
Dr. Lovell Sheehan, wanting to find out if you have heard from Ms. Fine hematologist. Dr Lovell Sheehan states that she really wants to get her started on the Accutane-please advise

## 2013-02-11 ENCOUNTER — Telehealth: Payer: Self-pay | Admitting: *Deleted

## 2013-02-11 NOTE — Telephone Encounter (Signed)
Spoke with pt & informed her that Dr. Nicki Reaper spoke with Dr. Gustavo Lah yesterday. He is okay with her starting the Accutane-but she will need to have a liver test in 1-2 weeks & again in 4 weeks once she starts the Accutane. Pt aware to call our office to schedule a lab appointment once she starts the Accutane. Same information was faxed to Dr. Lovell Sheehan office (office was closed).

## 2013-03-09 ENCOUNTER — Telehealth: Payer: Self-pay | Admitting: Internal Medicine

## 2013-03-09 NOTE — Telephone Encounter (Signed)
My chart message sent regarding f/u liver panel after starting accutane.

## 2013-03-10 ENCOUNTER — Encounter: Payer: Self-pay | Admitting: Internal Medicine

## 2013-04-19 ENCOUNTER — Other Ambulatory Visit: Payer: Self-pay | Admitting: *Deleted

## 2013-04-19 MED ORDER — LISINOPRIL 10 MG PO TABS: 10.0000 mg | ORAL_TABLET | Freq: Every day | ORAL | Status: DC

## 2013-04-29 ENCOUNTER — Encounter: Payer: Self-pay | Admitting: Internal Medicine

## 2013-04-29 ENCOUNTER — Ambulatory Visit (INDEPENDENT_AMBULATORY_CARE_PROVIDER_SITE_OTHER): Payer: BC Managed Care – PPO | Admitting: Internal Medicine

## 2013-04-29 VITALS — BP 122/80 | HR 73 | Temp 98.8°F | Ht 62.0 in | Wt 236.8 lb

## 2013-04-29 DIAGNOSIS — I10 Essential (primary) hypertension: Secondary | ICD-10-CM

## 2013-04-29 DIAGNOSIS — E7800 Pure hypercholesterolemia, unspecified: Secondary | ICD-10-CM

## 2013-04-29 DIAGNOSIS — R7309 Other abnormal glucose: Secondary | ICD-10-CM

## 2013-04-29 DIAGNOSIS — R739 Hyperglycemia, unspecified: Secondary | ICD-10-CM

## 2013-04-29 DIAGNOSIS — D649 Anemia, unspecified: Secondary | ICD-10-CM

## 2013-04-29 DIAGNOSIS — K769 Liver disease, unspecified: Secondary | ICD-10-CM

## 2013-04-29 DIAGNOSIS — R945 Abnormal results of liver function studies: Secondary | ICD-10-CM

## 2013-05-01 ENCOUNTER — Encounter: Payer: Self-pay | Admitting: Internal Medicine

## 2013-05-01 NOTE — Assessment & Plan Note (Signed)
On Lipitor.  Low cholesterol diet and exercise.  Check lipid panel and liver function with next labs.

## 2013-05-01 NOTE — Progress Notes (Signed)
Subjective:    [name], female    DOB: [birth year], 52 y.o.   MRN: [id number]  HPI 52 year old female with past history of hypertension, hypercholesterolemia and abnormal liver function who comes in today for a scheduled follow up.  States she is doing relatively well.  Mother has been sick and she reports increased stress related to this.  Her mother has now been set up with hospice. Feels she is handling things relatively well.  Eating and drinking well.  No nausea or vomiting.  Bowels stable.  No sinus or allergy symptoms now.  She is looking for a job.  Handling this well.  Has had several interviews.     Past Medical History  Diagnosis Date  . Abnormal liver function   . Allergy   . Heart murmur   . Hypertension   . Hypercholesterolemia   . GERD (gastroesophageal reflux disease)   . Hyperglycemia   . Anemia     Current Outpatient Prescriptions on File Prior to Visit  Medication Sig Dispense Refill  . atorvastatin (LIPITOR) 10 MG tablet Take 1 tablet (10 mg total) by mouth daily.  90 tablet  3  . citalopram (CELEXA) 20 MG tablet Take 1/2 a day      . Clobetasol Propionate (CLOBEX SPRAY EX) Apply topically daily as needed.       . doxycycline (PERIOSTAT) 20 MG tablet Take 20 mg by mouth 2 (two) times daily.      Marland Kitchen lisinopril (PRINIVIL,ZESTRIL) 10 MG tablet Take 1 tablet (10 mg total) by mouth daily.  90 tablet  1  . vitamin E 400 UNIT capsule Take 400 Units by mouth daily.       No current facility-administered medications on file prior to visit.    Review of Systems Patient denies any headache, lightheadedness or dizziness.  No sinus or allergy symptoms.  No chest pain, tightness or palpitations.  No increased shortness of breath, cough or congestion.  No nausea or vomiting.  No acid reflux.  No abdominal pain or cramping.  No bowel change, such as diarrhea, constipation, BRBPR or melana.  No urine change.  Handling stress relatively well.  Back on her celexa.   Taking 1/4 of a tablet.       Objective:   Physical Exam  Filed Vitals:   04/29/13 1456  BP: 122/80  Pulse: 73  Temp: 98.8 F (69.59 C)   52 year old female in no acute distress.   HEENT:  Nares- clear.  Oropharynx - without lesions. NECK:  Supple.  Nontender.  No audible bruit.  HEART:  Appears to be regular. LUNGS:  No crackles or wheezing audible.  Respirations even and unlabored.  RADIAL PULSE:  Equal bilaterally.   ABDOMEN:  Soft, nontender.  Bowel sounds present and normal.  No audible abdominal bruit.  EXTREMITIES:  No increased edema present.  DP pulses palpable and equal bilaterally.          Assessment & Plan:  INCREASED PSYCHOSOCIAL STRESSORS.  Doing well on Celexa.  Increased stress dealing with her mother's medical issues and looking for a job.   Does not feel she needs anything more at this point.  Follow.    GYN.  S/p [place] and doing well.  Released by Dr Amalia Hailey.    CARDIOVASCULAR.  Asymptomatic.  Continue risk factor modification.   PREVIOUS HEMATURIA.  U/A 09/18/11 revealed no rbc's.  Follow.    HEALTH MAINTENANCE.  Physical 08/20/12.  Mammogram  09/13/12 -  BiRADS II.

## 2013-05-01 NOTE — Assessment & Plan Note (Signed)
Blood pressure under good control.  Continue same medication.  Check metabolic panel with next labs.

## 2013-05-01 NOTE — Assessment & Plan Note (Signed)
Low carb diet.  Check metabolic panel and L1G.

## 2013-05-01 NOTE — Assessment & Plan Note (Signed)
On iron.  Follow cbc and ferritin.  Last cbc wnl.

## 2013-05-01 NOTE — Assessment & Plan Note (Signed)
Follows with Dr Donnella Sham.  Declined liver biopsy.  Check liver panel with next labs.

## 2013-05-09 ENCOUNTER — Other Ambulatory Visit (INDEPENDENT_AMBULATORY_CARE_PROVIDER_SITE_OTHER): Payer: BC Managed Care – PPO

## 2013-05-09 DIAGNOSIS — K769 Liver disease, unspecified: Secondary | ICD-10-CM

## 2013-05-09 DIAGNOSIS — R7309 Other abnormal glucose: Secondary | ICD-10-CM

## 2013-05-09 DIAGNOSIS — D649 Anemia, unspecified: Secondary | ICD-10-CM

## 2013-05-09 DIAGNOSIS — R739 Hyperglycemia, unspecified: Secondary | ICD-10-CM

## 2013-05-09 DIAGNOSIS — E7800 Pure hypercholesterolemia, unspecified: Secondary | ICD-10-CM

## 2013-05-09 DIAGNOSIS — I10 Essential (primary) hypertension: Secondary | ICD-10-CM

## 2013-05-09 DIAGNOSIS — R945 Abnormal results of liver function studies: Secondary | ICD-10-CM

## 2013-05-09 LAB — BASIC METABOLIC PANEL
BUN: 16 mg/dL (ref 6–23)
Creatinine, Ser: 0.8 mg/dL (ref 0.4–1.2)
GFR: 78.77 mL/min (ref >60.00)

## 2013-05-09 LAB — CBC WITH DIFFERENTIAL/PLATELET
Basophils Relative: 0.6 % (ref 0.0–3.0)
Eosinophils Relative: 4.1 % (ref 0.0–5.0)
Lymphocytes Relative: 28 % (ref 12.0–46.0)
Neutrophils Relative %: 60.5 % (ref 43.0–77.0)
RBC: 5.01 Mil/uL (ref 3.87–5.11)
WBC: 3.8 10*3/uL — ABNORMAL LOW (ref 4.5–10.5)

## 2013-05-09 LAB — FERRITIN: Ferritin: 39.1 ng/mL (ref 10.0–291.0)

## 2013-05-09 LAB — LIPID PANEL
Cholesterol: 190 mg/dL (ref 0–200)
HDL: 40.1 mg/dL (ref >39.00)
VLDL: 40.4 mg/dL — ABNORMAL HIGH (ref 0.0–40.0)

## 2013-05-09 LAB — HEMOGLOBIN A1C: Hgb A1c MFr Bld: 6.2 % (ref 4.6–6.5)

## 2013-05-09 LAB — HEPATIC FUNCTION PANEL: Albumin: 4.2 g/dL (ref 3.5–5.2)

## 2013-05-12 ENCOUNTER — Telehealth: Payer: Self-pay | Admitting: Internal Medicine

## 2013-05-12 ENCOUNTER — Encounter: Payer: Self-pay | Admitting: Internal Medicine

## 2013-05-12 DIAGNOSIS — D72819 Decreased white blood cell count, unspecified: Secondary | ICD-10-CM

## 2013-05-12 NOTE — Telephone Encounter (Signed)
Pt notified of lab results via my chart.  Needs a follow up lab in 3 weeks.  Please schedule her for a non fasting lab appt and contact pt with an appt date and time.  Thanks.

## 2013-05-12 NOTE — Telephone Encounter (Signed)
Appointment 9/22 pt aware

## 2013-05-17 NOTE — Telephone Encounter (Signed)
Mailed unread message to pt  

## 2013-06-06 ENCOUNTER — Other Ambulatory Visit (INDEPENDENT_AMBULATORY_CARE_PROVIDER_SITE_OTHER): Payer: BC Managed Care – PPO

## 2013-06-06 DIAGNOSIS — D72819 Decreased white blood cell count, unspecified: Secondary | ICD-10-CM

## 2013-06-06 LAB — CBC WITH DIFFERENTIAL/PLATELET
Basophils Absolute: 0 10*3/uL (ref 0.0–0.1)
Eosinophils Absolute: 0.3 10*3/uL (ref 0.0–0.7)
Hemoglobin: 13.4 g/dL (ref 12.0–15.0)
Lymphocytes Relative: 29.4 % (ref 12.0–46.0)
MCHC: 34.2 g/dL (ref 30.0–36.0)
Monocytes Absolute: 0.3 10*3/uL (ref 0.1–1.0)
Neutro Abs: 2.4 10*3/uL (ref 1.4–7.7)
RDW: 13.6 % (ref 11.5–14.6)

## 2013-06-07 ENCOUNTER — Encounter: Payer: Self-pay | Admitting: Internal Medicine

## 2013-06-13 ENCOUNTER — Ambulatory Visit: Payer: BC Managed Care – PPO

## 2013-06-15 ENCOUNTER — Ambulatory Visit (INDEPENDENT_AMBULATORY_CARE_PROVIDER_SITE_OTHER): Payer: BC Managed Care – PPO

## 2013-06-15 DIAGNOSIS — Z23 Encounter for immunization: Secondary | ICD-10-CM

## 2013-08-25 ENCOUNTER — Encounter: Payer: Self-pay | Admitting: Internal Medicine

## 2013-08-25 ENCOUNTER — Other Ambulatory Visit: Payer: Self-pay | Admitting: Internal Medicine

## 2013-08-25 ENCOUNTER — Other Ambulatory Visit: Payer: Self-pay | Admitting: *Deleted

## 2013-08-25 ENCOUNTER — Ambulatory Visit (INDEPENDENT_AMBULATORY_CARE_PROVIDER_SITE_OTHER): Payer: BC Managed Care – PPO | Admitting: Internal Medicine

## 2013-08-25 VITALS — BP 118/80 | HR 95 | Temp 98.9°F | Ht 62.0 in | Wt 227.2 lb

## 2013-08-25 DIAGNOSIS — D649 Anemia, unspecified: Secondary | ICD-10-CM

## 2013-08-25 DIAGNOSIS — E7800 Pure hypercholesterolemia, unspecified: Secondary | ICD-10-CM

## 2013-08-25 DIAGNOSIS — R7309 Other abnormal glucose: Secondary | ICD-10-CM

## 2013-08-25 DIAGNOSIS — R945 Abnormal results of liver function studies: Secondary | ICD-10-CM

## 2013-08-25 DIAGNOSIS — J069 Acute upper respiratory infection, unspecified: Secondary | ICD-10-CM

## 2013-08-25 DIAGNOSIS — I10 Essential (primary) hypertension: Secondary | ICD-10-CM

## 2013-08-25 DIAGNOSIS — R739 Hyperglycemia, unspecified: Secondary | ICD-10-CM

## 2013-08-25 DIAGNOSIS — Z1211 Encounter for screening for malignant neoplasm of colon: Secondary | ICD-10-CM

## 2013-08-25 DIAGNOSIS — K769 Liver disease, unspecified: Secondary | ICD-10-CM

## 2013-08-25 LAB — HEPATIC FUNCTION PANEL
ALT: 68 U/L — ABNORMAL HIGH (ref 0–35)
AST: 38 U/L — ABNORMAL HIGH (ref 0–37)
Albumin: 4.5 g/dL (ref 3.5–5.2)
Alkaline Phosphatase: 70 U/L (ref 39–117)
Bilirubin, Direct: 0.2 mg/dL (ref 0.0–0.3)
Total Protein: 7.8 g/dL (ref 6.0–8.3)

## 2013-08-25 LAB — BASIC METABOLIC PANEL
BUN: 13 mg/dL (ref 6–23)
CO2: 27 mEq/L (ref 19–32)
Calcium: 9.6 mg/dL (ref 8.4–10.5)
Creatinine, Ser: 1 mg/dL (ref 0.4–1.2)
GFR: 63.15 mL/min (ref >60.00)
Glucose, Bld: 107 mg/dL — ABNORMAL HIGH (ref 70–99)

## 2013-08-25 LAB — CBC WITH DIFFERENTIAL/PLATELET
Basophils Relative: 0.5 % (ref 0.0–3.0)
Eosinophils Relative: 1.5 % (ref 0.0–5.0)
HCT: 41.8 % (ref 36.0–46.0)
Hemoglobin: 14.1 g/dL (ref 12.0–15.0)
Lymphs Abs: 1.2 10*3/uL (ref 0.7–4.0)
Monocytes Relative: 7.4 % (ref 3.0–12.0)
Neutro Abs: 6 10*3/uL (ref 1.4–7.7)
RBC: 5.05 Mil/uL (ref 3.87–5.11)
WBC: 7.9 10*3/uL (ref 4.5–10.5)

## 2013-08-25 LAB — TSH: TSH: 1.43 u[IU]/mL (ref 0.35–5.50)

## 2013-08-25 LAB — LIPID PANEL: HDL: 45 mg/dL (ref >39.00)

## 2013-08-25 MED ORDER — ATORVASTATIN CALCIUM 10 MG PO TABS: 10.0000 mg | ORAL_TABLET | Freq: Every day | ORAL | Status: DC

## 2013-08-25 MED ORDER — SULFAMETHOXAZOLE-TMP DS 800-160 MG PO TABS: 1.0000 | ORAL_TABLET | Freq: Two times a day (BID) | ORAL | Status: DC

## 2013-08-25 MED ORDER — LISINOPRIL 10 MG PO TABS: 10.0000 mg | ORAL_TABLET | Freq: Every day | ORAL | Status: DC

## 2013-08-25 NOTE — Progress Notes (Signed)
Pre-visit discussion using our clinic review tool. No additional management support is needed unless otherwise documented below in the visit note.  

## 2013-08-25 NOTE — Progress Notes (Signed)
Subjective:    [name], female    DOB: [birth year], 52 y.o.   MRN: [id number]  HPI 52 year old female with past history of hypertension, hypercholesterolemia and abnormal liver function who comes in today to follow up on these issues as well as for a complete physical exam.  States she is doing relatively well.  Mother has not been eating and she reports increased stress related to this.  Her mother has now been set up with hospice again.  Feels she is handling things relatively well.  Eating and drinking well.  No nausea or vomiting.  Bowels stable.  She does report sore throat and cough.  Cough productive.  Stuffy nose and sinus pressure.  Green mucus production.  Fever. Tmax 100.  No chest tightness and no wheezing.  Eating and drinking well.  Off Lipitor for 3-4 weeks.       Past Medical History  Diagnosis Date  . Abnormal liver function   . Allergy   . Heart murmur   . Hypertension   . Hypercholesterolemia   . GERD (gastroesophageal reflux disease)   . Hyperglycemia   . Anemia     Current Outpatient Prescriptions on File Prior to Visit  Medication Sig Dispense Refill  . atorvastatin (LIPITOR) 10 MG tablet Take 1 tablet (10 mg total) by mouth daily.  90 tablet  3  . Clobetasol Propionate (CLOBEX SPRAY EX) Apply topically daily as needed.       . fluticasone (FLONASE) 50 MCG/ACT nasal spray Place 2 sprays into the nose as needed for rhinitis.      Marland Kitchen lisinopril (PRINIVIL,ZESTRIL) 10 MG tablet Take 1 tablet (10 mg total) by mouth daily.  90 tablet  1  . vitamin E 400 UNIT capsule Take 400 Units by mouth daily.      . citalopram (CELEXA) 20 MG tablet Take 1/2 a day       No current facility-administered medications on file prior to visit.    Review of Systems Patient denies any headache, lightheadedness or dizziness.  Sore throat.  No chest pain, tightness or palpitations.  No increased shortness of breath.  Cough and congestion as outlined.  No nausea or vomiting.   No acid reflux.  No abdominal pain or cramping.  No bowel change, such as diarrhea, constipation, BRBPR or melana.  No urine change.  Handling stress relatively well.  Off lipitor.  Ran out.       Objective:   Physical Exam  Filed Vitals:   08/25/13 0841  BP: 118/80  Pulse: 95  Temp: 98.9 F (80.52 C)   52 year old female in no acute distress.   HEENT:  Nares- slightly erythematous turbinates.  Oropharynx - without lesions. NECK:  Supple.  Nontender.  No audible bruit.  HEART:  Appears to be regular. LUNGS:  No crackles or wheezing audible.  Respirations even and unlabored.  RADIAL PULSE:  Equal bilaterally.    BREASTS:  No nipple discharge or nipple retraction present.  Could not appreciate any distinct nodules or axillary adenopathy.  ABDOMEN:  Soft, nontender.  Bowel sounds present and normal.  No audible abdominal bruit.  GU:  Not performed.     EXTREMITIES:  No increased edema present.  DP pulses palpable and equal bilaterally.          Assessment & Plan:  INCREASED PSYCHOSOCIAL STRESSORS.  Doing well on Celexa.  Increased stress dealing with her mother's medical issues and looking for a job.  Does not feel she needs anything more at this point.  Follow.    GYN.  S/p [place] and doing well.  Released by Dr Amalia Hailey.    CARDIOVASCULAR.  Asymptomatic.  Continue risk factor modification.   PREVIOUS HEMATURIA.  U/A 09/18/11 revealed no rbc's.  Follow.    HEALTH MAINTENANCE.  Physical today.  Mammogram 09/13/12 -  BiRADS II.  Schedule a f/u mammogram.  Will refer to GI for colon evaluation.  Referral to Dr Allen Norris.  Mother has a history of colon polyps.

## 2013-08-25 NOTE — Progress Notes (Signed)
Order placed for labs.

## 2013-08-26 ENCOUNTER — Encounter: Payer: Self-pay | Admitting: Internal Medicine

## 2013-08-26 MED ORDER — ATORVASTATIN CALCIUM 10 MG PO TABS: 10.0000 mg | ORAL_TABLET | Freq: Every day | ORAL | Status: DC

## 2013-08-28 ENCOUNTER — Encounter: Payer: Self-pay | Admitting: Internal Medicine

## 2013-08-28 ENCOUNTER — Telehealth: Payer: Self-pay | Admitting: Internal Medicine

## 2013-08-28 NOTE — Assessment & Plan Note (Signed)
Low carb diet.  Follow metabolic panel and T8U.

## 2013-08-28 NOTE — Assessment & Plan Note (Signed)
Blood pressure under good control.  Continue same medication.  Follow metabolic panel.

## 2013-08-28 NOTE — Assessment & Plan Note (Signed)
Off Lipitor.  Low cholesterol diet and exercise.  Check lipid panel and liver function.  Will probably need to restart.

## 2013-08-28 NOTE — Telephone Encounter (Signed)
I referred her to GI.  Pt wants to see Dr Allen Norris.

## 2013-08-28 NOTE — Assessment & Plan Note (Signed)
Symptoms as outlined.  Treat with bactrim DS as directed.  Saline nasal spray as directed.  mucinex and robitussin as directed.  Follow.  Notify me if symptoms persist.

## 2013-08-28 NOTE — Assessment & Plan Note (Signed)
On iron.  Follow cbc and ferritin.  Last cbc wnl.

## 2013-08-28 NOTE — Assessment & Plan Note (Signed)
Follows with Dr Donnella Sham.  Declined liver biopsy.  Liver panel stable.  Follow.

## 2013-08-29 ENCOUNTER — Encounter: Payer: Self-pay | Admitting: Emergency Medicine

## 2013-08-29 NOTE — Telephone Encounter (Signed)
Mailed unread message to pt  

## 2013-08-29 NOTE — Telephone Encounter (Signed)
Pt referral faxed to [place]

## 2013-09-05 MED ORDER — SULFAMETHOXAZOLE-TMP DS 800-160 MG PO TABS: 1.0000 | ORAL_TABLET | Freq: Two times a day (BID) | ORAL | Status: DC

## 2013-09-05 NOTE — Addendum Note (Signed)
Addended by: Alisa Graff on: 09/05/2013 01:29 PM   Modules accepted: Orders

## 2013-09-14 ENCOUNTER — Ambulatory Visit: Payer: Self-pay | Admitting: Internal Medicine

## 2013-09-14 LAB — HM MAMMOGRAPHY: HM Mammogram: NEGATIVE

## 2013-09-16 ENCOUNTER — Encounter: Payer: Self-pay | Admitting: Internal Medicine

## 2013-10-04 ENCOUNTER — Encounter: Payer: Self-pay | Admitting: Internal Medicine

## 2013-10-19 NOTE — Telephone Encounter (Signed)
Mailed unread message to pt  

## 2013-12-28 ENCOUNTER — Ambulatory Visit (INDEPENDENT_AMBULATORY_CARE_PROVIDER_SITE_OTHER): Payer: BC Managed Care – PPO | Admitting: Internal Medicine

## 2013-12-28 ENCOUNTER — Encounter: Payer: Self-pay | Admitting: Internal Medicine

## 2013-12-28 VITALS — BP 120/82 | HR 73 | Temp 98.5°F | Ht 62.0 in | Wt 226.5 lb

## 2013-12-28 DIAGNOSIS — R739 Hyperglycemia, unspecified: Secondary | ICD-10-CM

## 2013-12-28 DIAGNOSIS — I10 Essential (primary) hypertension: Secondary | ICD-10-CM

## 2013-12-28 DIAGNOSIS — K769 Liver disease, unspecified: Secondary | ICD-10-CM

## 2013-12-28 DIAGNOSIS — R7309 Other abnormal glucose: Secondary | ICD-10-CM

## 2013-12-28 DIAGNOSIS — R945 Abnormal results of liver function studies: Secondary | ICD-10-CM

## 2013-12-28 DIAGNOSIS — E7800 Pure hypercholesterolemia, unspecified: Secondary | ICD-10-CM

## 2013-12-28 DIAGNOSIS — D649 Anemia, unspecified: Secondary | ICD-10-CM

## 2013-12-28 LAB — BASIC METABOLIC PANEL
BUN: 11 mg/dL (ref 6–23)
CALCIUM: 9.5 mg/dL (ref 8.4–10.5)
CHLORIDE: 103 meq/L (ref 96–112)
CO2: 29 mEq/L (ref 19–32)
Creatinine, Ser: 0.9 mg/dL (ref 0.4–1.2)
GFR: 69.58 mL/min (ref >60.00)
Glucose, Bld: 116 mg/dL — ABNORMAL HIGH (ref 70–99)
Potassium: 4.2 mEq/L (ref 3.5–5.1)
SODIUM: 139 meq/L (ref 135–145)

## 2013-12-28 LAB — HEPATIC FUNCTION PANEL
ALT: 42 U/L — ABNORMAL HIGH (ref 0–35)
AST: 28 U/L (ref 0–37)
Albumin: 4 g/dL (ref 3.5–5.2)
Alkaline Phosphatase: 52 U/L (ref 39–117)
BILIRUBIN TOTAL: 0.7 mg/dL (ref 0.3–1.2)
Bilirubin, Direct: 0.1 mg/dL (ref 0.0–0.3)
TOTAL PROTEIN: 7.2 g/dL (ref 6.0–8.3)

## 2013-12-28 LAB — LIPID PANEL
Cholesterol: 198 mg/dL (ref 0–200)
HDL: 42.4 mg/dL (ref >39.00)
LDL CALC: 119 mg/dL — AB (ref 0–99)
TRIGLYCERIDES: 182 mg/dL — AB (ref 0.0–149.0)
Total CHOL/HDL Ratio: 5
VLDL: 36.4 mg/dL (ref 0.0–40.0)

## 2013-12-28 LAB — HEMOGLOBIN A1C: Hgb A1c MFr Bld: 5.7 % (ref 4.6–6.5)

## 2013-12-28 NOTE — Progress Notes (Signed)
Subjective:    [name], female    DOB: 1960-12-27, 53 y.o.   MRN: [id number]  HPI 53 year old female with past history of hypertension, hypercholesterolemia and abnormal liver function who comes in today for a scheduled follow up.  States she is doing relatively well.  Mother just recently passed away.  Feels she is handling things relatively well.  Eating and drinking well.  No nausea or vomiting.  Bowels stable.         Past Medical History  Diagnosis Date  . Abnormal liver function   . Allergy   . Heart murmur   . Hypertension   . Hypercholesterolemia   . GERD (gastroesophageal reflux disease)   . Hyperglycemia   . Anemia     Current Outpatient Prescriptions on File Prior to Visit  Medication Sig Dispense Refill  . atorvastatin (LIPITOR) 10 MG tablet Take 1 tablet (10 mg total) by mouth daily.  90 tablet  1  . Clobetasol Propionate (CLOBEX SPRAY EX) Apply topically daily as needed.       . fluticasone (FLONASE) 50 MCG/ACT nasal spray Place 2 sprays into the nose as needed for rhinitis.      Marland Kitchen lisinopril (PRINIVIL,ZESTRIL) 10 MG tablet Take 1 tablet (10 mg total) by mouth daily.  90 tablet  1  . b complex vitamins tablet Take 1 tablet by mouth daily.      . citalopram (CELEXA) 20 MG tablet Take 1/2 a day       No current facility-administered medications on file prior to visit.    Review of Systems Patient denies any headache, lightheadedness or dizziness.  No sinus or allergy symptoms.   No chest pain, tightness or palpitations.  No increased shortness of breath.  No cough or congestion.   No nausea or vomiting.  No acid reflux.  No abdominal pain or cramping.  No bowel change, such as diarrhea, constipation, BRBPR or melana.  No urine change.  Handling stress relatively well.  She was intermittently crying.  Mother recently passed.       Objective:   Physical Exam  Filed Vitals:   12/28/13 0759  BP: 120/82  Pulse: 73  Temp: 98.5 F (21.21 C)   53  year old female in no acute distress.   HEENT:  Nares- clear.  Oropharynx - without lesions. NECK:  Supple.  Nontender.  No audible bruit.  HEART:  Appears to be regular. LUNGS:  No crackles or wheezing audible.  Respirations even and unlabored.  RADIAL PULSE:  Equal bilaterally.  ABDOMEN:  Soft, nontender.  Bowel sounds present and normal.  No audible abdominal bruit.     EXTREMITIES:  No increased edema present.  DP pulses palpable and equal bilaterally.          Assessment & Plan:  INCREASED PSYCHOSOCIAL STRESSORS.  Doing well on Celexa.  Increased stress dealing with her mother's death.  Does not feel she needs anything more at this point.  Follow.  Discussed using the hospice counselors if needed.    GYN.  S/p [place] and doing well.  Released by Dr Amalia Hailey.    CARDIOVASCULAR.  Asymptomatic.  Continue risk factor modification.   PREVIOUS HEMATURIA.  U/A 09/18/11 revealed no rbc's.  Follow.    HEALTH MAINTENANCE.  Physical last visit.  Mammogram 09/14/13 -  BiRADS I.  Was referred to GI for colon evaluation.  Referred to Dr Allen Norris.  Mother has a history of colon polyps.  I spent 25 minutes with the patient and more than 50% of the time was spent in consultation regarding the above.

## 2013-12-30 NOTE — Telephone Encounter (Signed)
Unread mychart message mailed to patient 

## 2014-01-01 ENCOUNTER — Encounter: Payer: Self-pay | Admitting: Internal Medicine

## 2014-01-01 NOTE — Assessment & Plan Note (Signed)
Blood pressure under good control.  Continue same medication.  Follow metabolic panel.

## 2014-01-01 NOTE — Assessment & Plan Note (Signed)
Follows with Dr Donnella Sham.  Declined liver biopsy.  Liver panel stable.  Follow.

## 2014-01-01 NOTE — Assessment & Plan Note (Signed)
On iron.  Follow cbc and ferritin.  Last cbc wnl.

## 2014-01-01 NOTE — Assessment & Plan Note (Addendum)
Low cholesterol diet and exercise.  Check lipid panel and liver function.

## 2014-01-01 NOTE — Assessment & Plan Note (Signed)
Low carb diet.  Follow metabolic panel and O2V.

## 2014-01-23 ENCOUNTER — Encounter: Payer: Self-pay | Admitting: Internal Medicine

## 2014-01-23 ENCOUNTER — Ambulatory Visit (INDEPENDENT_AMBULATORY_CARE_PROVIDER_SITE_OTHER): Payer: BC Managed Care – PPO | Admitting: Internal Medicine

## 2014-01-23 VITALS — BP 100/60 | HR 81 | Temp 98.9°F | Ht 62.0 in | Wt 225.2 lb

## 2014-01-23 DIAGNOSIS — L409 Psoriasis, unspecified: Secondary | ICD-10-CM

## 2014-01-23 DIAGNOSIS — R739 Hyperglycemia, unspecified: Secondary | ICD-10-CM

## 2014-01-23 DIAGNOSIS — E7800 Pure hypercholesterolemia, unspecified: Secondary | ICD-10-CM

## 2014-01-23 DIAGNOSIS — K769 Liver disease, unspecified: Secondary | ICD-10-CM

## 2014-01-23 DIAGNOSIS — D649 Anemia, unspecified: Secondary | ICD-10-CM

## 2014-01-23 DIAGNOSIS — R7309 Other abnormal glucose: Secondary | ICD-10-CM

## 2014-01-23 DIAGNOSIS — I10 Essential (primary) hypertension: Secondary | ICD-10-CM

## 2014-01-23 DIAGNOSIS — R945 Abnormal results of liver function studies: Secondary | ICD-10-CM

## 2014-01-23 DIAGNOSIS — L408 Other psoriasis: Secondary | ICD-10-CM

## 2014-01-23 NOTE — Progress Notes (Signed)
Pre visit review using our clinic review tool, if applicable. No additional management support is needed unless otherwise documented below in the visit note. 

## 2014-01-23 NOTE — Progress Notes (Signed)
  Subjective:    [name], female    DOB: Jun 25, 1961, 53 y.o.   MRN: [id number]  HPI 53 year old female with past history of hypertension, hypercholesterolemia and abnormal liver function who comes in today for a scheduled follow up.  States she is doing relatively well.  Mother just recently passed away.  Feels she is handling things relatively well.  Eating and drinking well.  No nausea or vomiting.  Bowels stable.   Did meet with a hospice counselor.  Has good support from her family.  Seeing [facility] dermatology (Dr Fredonia Highland).  Was treated with prednisone.  Finished last week.  Took keflex and an ointment.  Has f/u 02/13/14.        Past Medical History  Diagnosis Date  . Abnormal liver function   . Allergy   . Heart murmur   . Hypertension   . Hypercholesterolemia   . GERD (gastroesophageal reflux disease)   . Hyperglycemia   . Anemia     Current Outpatient Prescriptions on File Prior to Visit  Medication Sig Dispense Refill  . atorvastatin (LIPITOR) 10 MG tablet Take 1 tablet (10 mg total) by mouth daily.  90 tablet  1  . fluticasone (FLONASE) 50 MCG/ACT nasal spray Place 2 sprays into the nose as needed for rhinitis.      Marland Kitchen lisinopril (PRINIVIL,ZESTRIL) 10 MG tablet Take 1 tablet (10 mg total) by mouth daily.  90 tablet  1   No current facility-administered medications on file prior to visit.    Review of Systems Patient denies any headache, lightheadedness or dizziness.  No sinus or allergy symptoms.   No chest pain, tightness or palpitations.  No increased shortness of breath.  No cough or congestion.   No nausea or vomiting.  No acid reflux.  No abdominal pain or cramping.  No bowel change, such as diarrhea, constipation, BRBPR or melana.  No urine change.  Handling stress relatively well.   Mother recently passed.  See above.       Objective:   Physical Exam  Filed Vitals:   01/23/14 1522  BP: 100/60  Pulse: 81  Temp: 98.9 F (37.2 C)   Blood pressure  recheck:  31/79  53 year old female in no acute distress.   HEENT:  Nares- clear.  Oropharynx - without lesions. NECK:  Supple.  Nontender.  No audible bruit.  HEART:  Appears to be regular. LUNGS:  No crackles or wheezing audible.  Respirations even and unlabored.  RADIAL PULSE:  Equal bilaterally.  ABDOMEN:  Soft, nontender.  Bowel sounds present and normal.  No audible abdominal bruit.     EXTREMITIES:  No increased edema present.  DP pulses palpable and equal bilaterally.          Assessment & Plan:  INCREASED PSYCHOSOCIAL STRESSORS.  Doing well on Celexa.  Increased stress dealing with her mother's death.  Does not feel she needs anything more at this point.  Follow.  Has seen the hospice counselor.    GYN.  S/p [place] and doing well.  Released by Dr Amalia Hailey.    CARDIOVASCULAR.  Asymptomatic.  Continue risk factor modification.   PREVIOUS HEMATURIA.  U/A 09/18/11 revealed no rbc's.  Follow.    HEALTH MAINTENANCE.  Physical 08/25/13.  Mammogram 09/14/13 -  BiRADS I.  Was referred to GI for colon evaluation.  Referred to Dr Allen Norris.  Mother has a history of colon polyps.

## 2014-01-30 ENCOUNTER — Encounter: Payer: Self-pay | Admitting: Internal Medicine

## 2014-01-30 NOTE — Assessment & Plan Note (Signed)
Follows with Dr Donnella Sham.  Declined liver biopsy.  Liver panel stable.  Follow.

## 2014-01-30 NOTE — Assessment & Plan Note (Signed)
On iron.  Follow cbc and ferritin.  Last cbc wnl.

## 2014-01-30 NOTE — Assessment & Plan Note (Signed)
Low carb diet.  Follow metabolic panel and W9K.

## 2014-01-30 NOTE — Assessment & Plan Note (Signed)
Blood pressure under good control.  Continue same medication.  Follow metabolic panel.

## 2014-01-30 NOTE — Assessment & Plan Note (Signed)
Low cholesterol diet and exercise.  Follow lipid panel and liver function.

## 2014-01-30 NOTE — Assessment & Plan Note (Signed)
Seeing [facility] dermatology (Dr Fredonia Highland).  Treated with prednisone.  Off now.  Used keflex and an ointment.  Due f/u 02/13/14.

## 2014-05-23 ENCOUNTER — Other Ambulatory Visit: Payer: Self-pay | Admitting: *Deleted

## 2014-05-23 MED ORDER — ATORVASTATIN CALCIUM 10 MG PO TABS: 10.0000 mg | ORAL_TABLET | Freq: Every day | ORAL | Status: DC

## 2014-05-24 ENCOUNTER — Other Ambulatory Visit (INDEPENDENT_AMBULATORY_CARE_PROVIDER_SITE_OTHER): Payer: BC Managed Care – PPO

## 2014-05-24 ENCOUNTER — Encounter: Payer: Self-pay | Admitting: Internal Medicine

## 2014-05-24 DIAGNOSIS — K769 Liver disease, unspecified: Secondary | ICD-10-CM

## 2014-05-24 DIAGNOSIS — I10 Essential (primary) hypertension: Secondary | ICD-10-CM

## 2014-05-24 DIAGNOSIS — E7800 Pure hypercholesterolemia, unspecified: Secondary | ICD-10-CM

## 2014-05-24 DIAGNOSIS — R945 Abnormal results of liver function studies: Secondary | ICD-10-CM

## 2014-05-24 DIAGNOSIS — R739 Hyperglycemia, unspecified: Secondary | ICD-10-CM

## 2014-05-24 DIAGNOSIS — R7309 Other abnormal glucose: Secondary | ICD-10-CM

## 2014-05-24 LAB — HEPATIC FUNCTION PANEL
ALT: 57 U/L — ABNORMAL HIGH (ref 0–35)
AST: 36 U/L (ref 0–37)
Albumin: 3.9 g/dL (ref 3.5–5.2)
Alkaline Phosphatase: 65 U/L (ref 39–117)
BILIRUBIN TOTAL: 0.5 mg/dL (ref 0.2–1.2)
Bilirubin, Direct: 0 mg/dL (ref 0.0–0.3)
Total Protein: 7.3 g/dL (ref 6.0–8.3)

## 2014-05-24 LAB — LIPID PANEL
Cholesterol: 163 mg/dL (ref 0–200)
HDL: 38.7 mg/dL — ABNORMAL LOW (ref >39.00)
LDL CALC: 96 mg/dL (ref 0–99)
NONHDL: 124.3
Total CHOL/HDL Ratio: 4
Triglycerides: 141 mg/dL (ref 0.0–149.0)
VLDL: 28.2 mg/dL (ref 0.0–40.0)

## 2014-05-24 LAB — BASIC METABOLIC PANEL
BUN: 20 mg/dL (ref 6–23)
CO2: 28 mEq/L (ref 19–32)
Calcium: 9.3 mg/dL (ref 8.4–10.5)
Chloride: 102 mEq/L (ref 96–112)
Creatinine, Ser: 0.9 mg/dL (ref 0.4–1.2)
GFR: 66.89 mL/min (ref >60.00)
GLUCOSE: 113 mg/dL — AB (ref 70–99)
POTASSIUM: 4.3 meq/L (ref 3.5–5.1)
SODIUM: 138 meq/L (ref 135–145)

## 2014-05-24 LAB — HEMOGLOBIN A1C: HEMOGLOBIN A1C: 6.2 % (ref 4.6–6.5)

## 2014-05-26 ENCOUNTER — Encounter: Payer: Self-pay | Admitting: Internal Medicine

## 2014-05-26 ENCOUNTER — Ambulatory Visit (INDEPENDENT_AMBULATORY_CARE_PROVIDER_SITE_OTHER): Payer: BC Managed Care – PPO | Admitting: Internal Medicine

## 2014-05-26 VITALS — BP 110/80 | HR 81 | Temp 98.6°F | Ht 62.0 in | Wt 232.5 lb

## 2014-05-26 DIAGNOSIS — R945 Abnormal results of liver function studies: Secondary | ICD-10-CM

## 2014-05-26 DIAGNOSIS — E7800 Pure hypercholesterolemia, unspecified: Secondary | ICD-10-CM

## 2014-05-26 DIAGNOSIS — R739 Hyperglycemia, unspecified: Secondary | ICD-10-CM

## 2014-05-26 DIAGNOSIS — L408 Other psoriasis: Secondary | ICD-10-CM

## 2014-05-26 DIAGNOSIS — L409 Psoriasis, unspecified: Secondary | ICD-10-CM

## 2014-05-26 DIAGNOSIS — D649 Anemia, unspecified: Secondary | ICD-10-CM

## 2014-05-26 DIAGNOSIS — R7309 Other abnormal glucose: Secondary | ICD-10-CM

## 2014-05-26 DIAGNOSIS — K769 Liver disease, unspecified: Secondary | ICD-10-CM

## 2014-05-26 DIAGNOSIS — I10 Essential (primary) hypertension: Secondary | ICD-10-CM

## 2014-05-26 NOTE — Patient Instructions (Signed)
Omeprazole - take one tablet per day (30 minutes before breakfast).  Let me know if any problems.

## 2014-05-26 NOTE — Progress Notes (Signed)
Pre visit review using our clinic review tool, if applicable. No additional management support is needed unless otherwise documented below in the visit note. 

## 2014-05-27 ENCOUNTER — Encounter: Payer: Self-pay | Admitting: Internal Medicine

## 2014-05-27 NOTE — Assessment & Plan Note (Signed)
Blood pressure under good control.  Continue same medication.  Follow metabolic panel.

## 2014-05-27 NOTE — Assessment & Plan Note (Signed)
On iron.  Follow cbc and ferritin.  Last cbc wnl.

## 2014-05-27 NOTE — Assessment & Plan Note (Signed)
Low cholesterol diet and exercise.  Follow lipid panel and liver function.  Recent cholesterol panel revealed total cholesterol 163, triglycerides 141, HDL 39 and LDL 96.

## 2014-05-27 NOTE — Assessment & Plan Note (Signed)
Follows with Dr Donnella Sham.  Declined liver biopsy.  Liver panel stable.  Follow.

## 2014-05-27 NOTE — Assessment & Plan Note (Signed)
Low carb diet.  Follow metabolic panel and E0V.  A1c just checked 05/24/14 - 6.2.

## 2014-05-27 NOTE — Assessment & Plan Note (Signed)
Seeing [facility] dermatology (Dr Fredonia Highland).

## 2014-05-27 NOTE — Progress Notes (Signed)
Subjective:    [name], female    DOB: 12-Apr-1961, 53 y.o.   MRN: [id number]  HPI 53 year old female with past history of hypertension, hypercholesterolemia and abnormal liver function who comes in today for a scheduled follow up.  States she is doing better.  Mother recently passed away.  She attended hospice counseling for a while.  Does not feel she needs at this point.  Feels she is handling things relatively well.  Eating and drinking well.  No nausea or vomiting.  Bowels stable.   Did meet with a hospice counselor.  Seeing [facility] dermatology (Dr Fredonia Highland).  Has noticed some reflux.  Was taking omeprazole.  Took for two weeks.  Better.  Coffee aggravates.  Trying to avoid caffeine (including soft drinks).  No abdominal pain.  Overall she feels she is doing well.         Past Medical History  Diagnosis Date  . Abnormal liver function   . Allergy   . Heart murmur   . Hypertension   . Hypercholesterolemia   . GERD (gastroesophageal reflux disease)   . Hyperglycemia   . Anemia     Current Outpatient Prescriptions on File Prior to Visit  Medication Sig Dispense Refill  . atorvastatin (LIPITOR) 10 MG tablet Take 1 tablet (10 mg total) by mouth daily.  90 tablet  1  . betamethasone dipropionate (DIPROLENE) 0.05 % ointment Apply topically 2 (two) times daily. X 2 weeks      . fluticasone (FLONASE) 50 MCG/ACT nasal spray Place 2 sprays into the nose as needed for rhinitis.      Marland Kitchen lisinopril (PRINIVIL,ZESTRIL) 10 MG tablet Take 1 tablet (10 mg total) by mouth daily.  90 tablet  1   No current facility-administered medications on file prior to visit.    Review of Systems Patient denies any headache, lightheadedness or dizziness.  No sinus or allergy symptoms.   No chest pain, tightness or palpitations.  No increased shortness of breath.  No cough or congestion.   No nausea or vomiting.  Acid reflux as outlined. No abdominal pain or cramping.  No bowel change, such as diarrhea,  constipation, BRBPR or melana.  No urine change.  Handling stress relatively well.   Mother recently passed.  See above.       Objective:   Physical Exam  Filed Vitals:   05/26/14 1608  BP: 110/80  Pulse: 81  Temp: 98.6 F (37 C)   Blood pressure recheck:  70/57  53 year old female in no acute distress.   HEENT:  Nares- clear.  Oropharynx - without lesions. NECK:  Supple.  Nontender.  No audible bruit.  HEART:  Appears to be regular. LUNGS:  No crackles or wheezing audible.  Respirations even and unlabored.  RADIAL PULSE:  Equal bilaterally.  ABDOMEN:  Soft, nontender.  Bowel sounds present and normal.  No audible abdominal bruit.     EXTREMITIES:  No increased edema present.  DP pulses palpable and equal bilaterally.          Assessment & Plan:  INCREASED PSYCHOSOCIAL STRESSORS.  Doing well on Celexa.  Increased stress dealing with her mother's death.  Does not feel she needs anything more at this point.  Follow.  Has seen the hospice counselor.  Doing better.  GYN.  S/p [place] and doing well.  Released by Dr Amalia Hailey.    CARDIOVASCULAR.  Asymptomatic.  Continue risk factor modification.   PREVIOUS HEMATURIA.  U/A  09/18/11 revealed no rbc's.  Follow.    HEALTH MAINTENANCE.  Physical 08/25/13.  Mammogram 09/14/13 -  BiRADS I.  Was referred to GI for colon evaluation.  Referred to Dr Allen Norris.  Mother has a history of colon polyps.

## 2014-06-26 ENCOUNTER — Encounter: Payer: Self-pay | Admitting: Internal Medicine

## 2014-08-01 ENCOUNTER — Encounter: Payer: Self-pay | Admitting: Internal Medicine

## 2014-08-01 NOTE — Telephone Encounter (Addendum)
Can you please add her to Scott's schedule 12:00 08/03/14, thanks! Pt aware of appt date

## 2014-08-01 NOTE — Telephone Encounter (Signed)
Where would you like to add her?

## 2014-08-03 ENCOUNTER — Ambulatory Visit (INDEPENDENT_AMBULATORY_CARE_PROVIDER_SITE_OTHER): Payer: BC Managed Care – PPO | Admitting: Internal Medicine

## 2014-08-03 ENCOUNTER — Encounter: Payer: Self-pay | Admitting: Internal Medicine

## 2014-08-03 ENCOUNTER — Ambulatory Visit: Payer: Self-pay | Admitting: Internal Medicine

## 2014-08-03 VITALS — BP 130/90 | HR 70 | Temp 98.1°F | Ht 62.0 in | Wt 232.8 lb

## 2014-08-03 DIAGNOSIS — S3692XA Contusion of unspecified intra-abdominal organ, initial encounter: Secondary | ICD-10-CM

## 2014-08-03 DIAGNOSIS — N6489 Other specified disorders of breast: Secondary | ICD-10-CM

## 2014-08-03 DIAGNOSIS — IMO0001 Reserved for inherently not codable concepts without codable children: Secondary | ICD-10-CM

## 2014-08-03 DIAGNOSIS — S0990XA Unspecified injury of head, initial encounter: Secondary | ICD-10-CM

## 2014-08-03 DIAGNOSIS — S3690XA Unspecified injury of unspecified intra-abdominal organ, initial encounter: Secondary | ICD-10-CM

## 2014-08-03 DIAGNOSIS — R42 Dizziness and giddiness: Secondary | ICD-10-CM

## 2014-08-03 LAB — CBC WITH DIFFERENTIAL/PLATELET
BASOS ABS: 0 10*3/uL (ref 0.0–0.1)
Basophils Relative: 0.9 % (ref 0.0–3.0)
EOS ABS: 0.2 10*3/uL (ref 0.0–0.7)
Eosinophils Relative: 3.5 % (ref 0.0–5.0)
HEMATOCRIT: 39.7 % (ref 36.0–46.0)
Hemoglobin: 13.1 g/dL (ref 12.0–15.0)
LYMPHS ABS: 1.4 10*3/uL (ref 0.7–4.0)
Lymphocytes Relative: 27.9 % (ref 12.0–46.0)
MCHC: 33.1 g/dL (ref 30.0–36.0)
MCV: 83.5 fl (ref 78.0–100.0)
Monocytes Absolute: 0.4 10*3/uL (ref 0.1–1.0)
Monocytes Relative: 7.1 % (ref 3.0–12.0)
Neutro Abs: 3.1 10*3/uL (ref 1.4–7.7)
Neutrophils Relative %: 60.6 % (ref 43.0–77.0)
Platelets: 257 10*3/uL (ref 150.0–400.0)
RBC: 4.76 Mil/uL (ref 3.87–5.11)
RDW: 13.8 % (ref 11.5–15.5)
WBC: 5.1 10*3/uL (ref 4.0–10.5)

## 2014-08-03 NOTE — Progress Notes (Signed)
Pre visit review using our clinic review tool, if applicable. No additional management support is needed unless otherwise documented below in the visit note. 

## 2014-08-04 ENCOUNTER — Encounter: Payer: Self-pay | Admitting: Internal Medicine

## 2014-08-04 ENCOUNTER — Telehealth: Payer: Self-pay | Admitting: Internal Medicine

## 2014-08-04 NOTE — Telephone Encounter (Signed)
Pt notified CT head negative via my chart.

## 2014-08-06 ENCOUNTER — Encounter: Payer: Self-pay | Admitting: Internal Medicine

## 2014-08-06 DIAGNOSIS — N6489 Other specified disorders of breast: Secondary | ICD-10-CM | POA: Insufficient documentation

## 2014-08-06 DIAGNOSIS — R42 Dizziness and giddiness: Secondary | ICD-10-CM | POA: Insufficient documentation
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
## 2014-08-09 ENCOUNTER — Encounter: Payer: Self-pay | Admitting: Internal Medicine

## 2014-08-09 ENCOUNTER — Ambulatory Visit (INDEPENDENT_AMBULATORY_CARE_PROVIDER_SITE_OTHER): Payer: BC Managed Care – PPO | Admitting: Internal Medicine

## 2014-08-09 ENCOUNTER — Ambulatory Visit: Payer: Self-pay | Admitting: Internal Medicine

## 2014-08-09 DIAGNOSIS — N6489 Other specified disorders of breast: Secondary | ICD-10-CM

## 2014-08-09 NOTE — Progress Notes (Signed)
Pre visit review using our clinic review tool, if applicable. No additional management support is needed unless otherwise documented below in the visit note. 

## 2014-08-13 ENCOUNTER — Encounter: Payer: Self-pay | Admitting: Internal Medicine
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
## 2014-08-16 ENCOUNTER — Telehealth: Payer: Self-pay | Admitting: Internal Medicine

## 2014-08-16 NOTE — Telephone Encounter (Signed)
Pt notified of xray results via my chart.

## 2014-08-21 ENCOUNTER — Other Ambulatory Visit: Payer: Self-pay | Admitting: *Deleted

## 2014-08-21 MED ORDER — LISINOPRIL 10 MG PO TABS
10.0000 mg | ORAL_TABLET | Freq: Every day | ORAL | Status: DC
Start: 2014-08-21 — End: 2015-08-24

## 2014-08-22 ENCOUNTER — Encounter: Payer: Self-pay | Admitting: Internal Medicine

## 2014-08-29 ENCOUNTER — Other Ambulatory Visit (INDEPENDENT_AMBULATORY_CARE_PROVIDER_SITE_OTHER): Payer: BC Managed Care – PPO

## 2014-08-29 DIAGNOSIS — K7689 Other specified diseases of liver: Secondary | ICD-10-CM

## 2014-08-29 DIAGNOSIS — R739 Hyperglycemia, unspecified: Secondary | ICD-10-CM

## 2014-08-29 DIAGNOSIS — E7800 Pure hypercholesterolemia, unspecified: Secondary | ICD-10-CM

## 2014-08-29 DIAGNOSIS — R945 Abnormal results of liver function studies: Secondary | ICD-10-CM

## 2014-08-29 DIAGNOSIS — D649 Anemia, unspecified: Secondary | ICD-10-CM

## 2014-08-29 DIAGNOSIS — I10 Essential (primary) hypertension: Secondary | ICD-10-CM

## 2014-08-29 LAB — BASIC METABOLIC PANEL
BUN: 19 mg/dL (ref 6–23)
CALCIUM: 9.3 mg/dL (ref 8.4–10.5)
CO2: 26 mEq/L (ref 19–32)
Chloride: 102 mEq/L (ref 96–112)
Creatinine, Ser: 0.9 mg/dL (ref 0.4–1.2)
GFR: 69.4 mL/min (ref >60.00)
Glucose, Bld: 137 mg/dL — ABNORMAL HIGH (ref 70–99)
Potassium: 4.9 mEq/L (ref 3.5–5.1)
Sodium: 136 mEq/L (ref 135–145)

## 2014-08-29 LAB — HEPATIC FUNCTION PANEL
ALT: 89 U/L — AB (ref 0–35)
AST: 70 U/L — ABNORMAL HIGH (ref 0–37)
Albumin: 4.1 g/dL (ref 3.5–5.2)
Alkaline Phosphatase: 73 U/L (ref 39–117)
BILIRUBIN DIRECT: 0 mg/dL (ref 0.0–0.3)
TOTAL PROTEIN: 7.4 g/dL (ref 6.0–8.3)
Total Bilirubin: 0.4 mg/dL (ref 0.2–1.2)

## 2014-08-29 LAB — CBC WITH DIFFERENTIAL/PLATELET
Basophils Absolute: 0 10*3/uL (ref 0.0–0.1)
Basophils Relative: 0.7 % (ref 0.0–3.0)
EOS PCT: 3.2 % (ref 0.0–5.0)
Eosinophils Absolute: 0.1 10*3/uL (ref 0.0–0.7)
HCT: 41.6 % (ref 36.0–46.0)
Hemoglobin: 13.6 g/dL (ref 12.0–15.0)
Lymphocytes Relative: 27.7 % (ref 12.0–46.0)
Lymphs Abs: 1.1 10*3/uL (ref 0.7–4.0)
MCHC: 32.8 g/dL (ref 30.0–36.0)
MCV: 83.4 fl (ref 78.0–100.0)
MONO ABS: 0.3 10*3/uL (ref 0.1–1.0)
Monocytes Relative: 7.2 % (ref 3.0–12.0)
Neutro Abs: 2.4 10*3/uL (ref 1.4–7.7)
Neutrophils Relative %: 61.2 % (ref 43.0–77.0)
Platelets: 218 10*3/uL (ref 150.0–400.0)
RBC: 4.99 Mil/uL (ref 3.87–5.11)
RDW: 14.1 % (ref 11.5–15.5)
WBC: 3.9 10*3/uL — AB (ref 4.0–10.5)

## 2014-08-29 LAB — LIPID PANEL
CHOLESTEROL: 179 mg/dL (ref 0–200)
HDL: 39.1 mg/dL (ref >39.00)
LDL Cholesterol: 115 mg/dL — ABNORMAL HIGH (ref 0–99)
NonHDL: 139.9
TRIGLYCERIDES: 123 mg/dL (ref 0.0–149.0)
Total CHOL/HDL Ratio: 5
VLDL: 24.6 mg/dL (ref 0.0–40.0)

## 2014-08-29 LAB — TSH: TSH: 2.6 u[IU]/mL (ref 0.35–4.50)

## 2014-08-29 LAB — HEMOGLOBIN A1C: Hgb A1c MFr Bld: 6.2 % (ref 4.6–6.5)

## 2014-08-30 ENCOUNTER — Encounter: Payer: Self-pay | Admitting: Internal Medicine

## 2014-08-30 ENCOUNTER — Ambulatory Visit (INDEPENDENT_AMBULATORY_CARE_PROVIDER_SITE_OTHER): Payer: BC Managed Care – PPO | Admitting: Internal Medicine

## 2014-08-30 VITALS — BP 124/76 | HR 78 | Temp 98.2°F | Ht 62.0 in | Wt 229.5 lb

## 2014-08-30 DIAGNOSIS — K7689 Other specified diseases of liver: Secondary | ICD-10-CM

## 2014-08-30 DIAGNOSIS — E669 Obesity, unspecified: Secondary | ICD-10-CM

## 2014-08-30 DIAGNOSIS — E7800 Pure hypercholesterolemia, unspecified: Secondary | ICD-10-CM

## 2014-08-30 DIAGNOSIS — R739 Hyperglycemia, unspecified: Secondary | ICD-10-CM

## 2014-08-30 DIAGNOSIS — D72819 Decreased white blood cell count, unspecified: Secondary | ICD-10-CM

## 2014-08-30 DIAGNOSIS — R945 Abnormal results of liver function studies: Secondary | ICD-10-CM

## 2014-08-30 DIAGNOSIS — D649 Anemia, unspecified: Secondary | ICD-10-CM

## 2014-08-30 DIAGNOSIS — I10 Essential (primary) hypertension: Secondary | ICD-10-CM

## 2014-08-30 DIAGNOSIS — N6489 Other specified disorders of breast: Secondary | ICD-10-CM

## 2014-08-30 DIAGNOSIS — R7989 Other specified abnormal findings of blood chemistry: Secondary | ICD-10-CM

## 2014-08-30 NOTE — Progress Notes (Signed)
Pre visit review using our clinic review tool, if applicable. No additional management support is needed unless otherwise documented below in the visit note. 
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
## 2014-08-31 ENCOUNTER — Encounter: Payer: BC Managed Care – PPO | Admitting: Internal Medicine

## 2014-09-03 ENCOUNTER — Encounter: Payer: Self-pay | Admitting: Internal Medicine

## 2014-09-03 DIAGNOSIS — Z6841 Body Mass Index (BMI) 40.0 and over, adult: Secondary | ICD-10-CM | POA: Insufficient documentation

## 2014-09-03 DIAGNOSIS — E669 Obesity, unspecified: Secondary | ICD-10-CM | POA: Insufficient documentation

## 2014-09-04 ENCOUNTER — Encounter: Payer: Self-pay | Admitting: Internal Medicine

## 2014-09-20 ENCOUNTER — Other Ambulatory Visit: Payer: BC Managed Care – PPO

## 2014-09-26 ENCOUNTER — Other Ambulatory Visit (INDEPENDENT_AMBULATORY_CARE_PROVIDER_SITE_OTHER): Payer: BC Managed Care – PPO

## 2014-09-26 DIAGNOSIS — R945 Abnormal results of liver function studies: Secondary | ICD-10-CM

## 2014-09-26 DIAGNOSIS — R7989 Other specified abnormal findings of blood chemistry: Secondary | ICD-10-CM

## 2014-09-26 DIAGNOSIS — D72819 Decreased white blood cell count, unspecified: Secondary | ICD-10-CM

## 2014-09-27 LAB — CBC WITH DIFFERENTIAL/PLATELET
BASOS ABS: 0 10*3/uL (ref 0.0–0.1)
BASOS PCT: 0.6 % (ref 0.0–3.0)
EOS ABS: 0.2 10*3/uL (ref 0.0–0.7)
Eosinophils Relative: 2.8 % (ref 0.0–5.0)
HCT: 43.1 % (ref 36.0–46.0)
Hemoglobin: 14.2 g/dL (ref 12.0–15.0)
Lymphocytes Relative: 28.1 % (ref 12.0–46.0)
Lymphs Abs: 1.6 10*3/uL (ref 0.7–4.0)
MCHC: 33 g/dL (ref 30.0–36.0)
MCV: 83.7 fl (ref 78.0–100.0)
MONO ABS: 0.5 10*3/uL (ref 0.1–1.0)
Monocytes Relative: 8.8 % (ref 3.0–12.0)
NEUTROS PCT: 59.7 % (ref 43.0–77.0)
Neutro Abs: 3.5 10*3/uL (ref 1.4–7.7)
PLATELETS: 224 10*3/uL (ref 150.0–400.0)
RBC: 5.15 Mil/uL — ABNORMAL HIGH (ref 3.87–5.11)
RDW: 13.6 % (ref 11.5–15.5)
WBC: 5.8 10*3/uL (ref 4.0–10.5)

## 2014-09-27 LAB — HEPATIC FUNCTION PANEL
ALBUMIN: 4.4 g/dL (ref 3.5–5.2)
ALK PHOS: 84 U/L (ref 39–117)
ALT: 92 U/L — ABNORMAL HIGH (ref 0–35)
AST: 74 U/L — AB (ref 0–37)
Bilirubin, Direct: 0 mg/dL (ref 0.0–0.3)
Total Bilirubin: 0.3 mg/dL (ref 0.2–1.2)
Total Protein: 7.7 g/dL (ref 6.0–8.3)

## 2014-09-29 ENCOUNTER — Encounter: Payer: Self-pay | Admitting: Internal Medicine

## 2014-10-13 ENCOUNTER — Encounter: Payer: Self-pay | Admitting: Internal Medicine

## 2014-10-17 ENCOUNTER — Encounter: Payer: Self-pay | Admitting: Internal Medicine

## 2014-10-17 ENCOUNTER — Ambulatory Visit (INDEPENDENT_AMBULATORY_CARE_PROVIDER_SITE_OTHER): Payer: BC Managed Care – PPO | Admitting: Internal Medicine

## 2014-10-17 VITALS — BP 130/80 | HR 67 | Temp 98.5°F | Ht 62.0 in | Wt 228.5 lb

## 2014-10-17 DIAGNOSIS — R21 Rash and other nonspecific skin eruption: Secondary | ICD-10-CM

## 2014-10-17 DIAGNOSIS — I10 Essential (primary) hypertension: Secondary | ICD-10-CM

## 2014-10-17 NOTE — Progress Notes (Signed)
Pre visit review using our clinic review tool, if applicable. No additional management support is needed unless otherwise documented below in the visit note. 

## 2014-10-17 NOTE — Progress Notes (Signed)
[name], female   DOB: 04/22/1961, 54 y.o.   MRN: [id number]   Subjective:    [name], female    DOB: 03/23/1961, 54 y.o.   MRN: [id number]  HPI  Patient here for a work in with persistent rash.  Has a history of hypercholesterolemia and hypertension.  Present for months.  Worsened lately.  Localized first on back.  Left side upper back.  Left lateral ankle.  Has tried otc cortisone cream and betamethasone.  Saw dermatology at [facility].  Thought eczema.  Gave her the betamethasone.  No fever.  Feels fine otherwise.     Past Medical History  Diagnosis Date  . Abnormal liver function   . Allergy   . Heart murmur   . Hypertension   . Hypercholesterolemia   . GERD (gastroesophageal reflux disease)   . Hyperglycemia   . Anemia     Current Outpatient Prescriptions on File Prior to Visit  Medication Sig Dispense Refill  . atorvastatin (LIPITOR) 10 MG tablet Take 1 tablet (10 mg total) by mouth daily. 90 tablet 1  . fluticasone (FLONASE) 50 MCG/ACT nasal spray Place 2 sprays into the nose as needed for rhinitis.    Marland Kitchen lisinopril (PRINIVIL,ZESTRIL) 10 MG tablet Take 1 tablet (10 mg total) by mouth daily. 90 tablet 1  . loratadine (CLARITIN) 10 MG tablet Take 10 mg by mouth daily.     No current facility-administered medications on file prior to visit.    Review of Systems  Constitutional: Negative for fever and chills.  HENT: Negative for congestion and sore throat.        No lip or tongue swelling   Respiratory: Negative for cough, chest tightness and shortness of breath.   Cardiovascular: Negative for chest pain and leg swelling.  Skin:       Rash left upper back and left lateral ankle.         Objective:    Physical Exam  HENT:  Nose: Nose normal.  Mouth/Throat: Oropharynx is clear and moist.  Neck: Neck supple.  Cardiovascular: Normal rate and regular rhythm.   Pulmonary/Chest: Breath sounds normal. No respiratory distress. She has no  wheezes.  Abdominal: Bowel sounds are normal.  Musculoskeletal: She exhibits no edema or tenderness.  Lymphadenopathy:    She has no cervical adenopathy.  Skin:  Erythematous rash noted left upper back and left lateral ankle.    BP 130/80 mmHg  Pulse 67  Temp(Src) 98.5 F (36.9 C) (Oral)  Ht 5' 2"  (1.575 m)  Wt 228 lb 8 oz (103.647 kg)  BMI 41.78 kg/m2  SpO2 95% Wt Readings from Last 3 Encounters:  10/17/14 228 lb 8 oz (103.647 kg)  08/30/14 229 lb 8 oz (104.101 kg)  08/09/14 229 lb 12 oz (104.214 kg)     Lab Results  Component Value Date   WBC 5.8 09/26/2014   HGB 14.2 09/26/2014   HCT 43.1 09/26/2014   PLT 224.0 09/26/2014   GLUCOSE 137* 08/29/2014   CHOL 179 08/29/2014   TRIG 123.0 08/29/2014   HDL 39.10 08/29/2014   LDLDIRECT 171.8 08/25/2013   LDLCALC 115* 08/29/2014   ALT 92* 09/26/2014   AST 74* 09/26/2014   NA 136 08/29/2014   K 4.9 08/29/2014   CL 102 08/29/2014   CREATININE 0.9 08/29/2014   BUN 19 08/29/2014   CO2 26 08/29/2014   TSH 2.60 08/29/2014   HGBA1C 6.2 08/29/2014       Assessment &  Plan:   Problem List Items Addressed This Visit    Hypertension - Primary    Blood pressure doing well.  Hold on changing her medication.  I do not feel this rash is related to her lisinopril.  Rash not c/w drug rash.         Other Visit Diagnoses    Rash        persistent.  worsening.  unclear etiology.  has appt with dermatology in the future.  has tried topical steroids.  will try medrol dose pack (6 day taper).          Einar Pheasant, MD

## 2014-10-19 ENCOUNTER — Encounter: Payer: Self-pay | Admitting: Internal Medicine

## 2014-10-19 NOTE — Assessment & Plan Note (Signed)
Blood pressure doing well.  Hold on changing her medication.  I do not feel this rash is related to her lisinopril.  Rash not c/w drug rash.

## 2014-11-02 ENCOUNTER — Ambulatory Visit: Payer: BC Managed Care – PPO | Admitting: Internal Medicine

## 2015-01-04 ENCOUNTER — Ambulatory Visit (INDEPENDENT_AMBULATORY_CARE_PROVIDER_SITE_OTHER): Payer: BC Managed Care – PPO | Admitting: Internal Medicine

## 2015-01-04 ENCOUNTER — Encounter: Payer: Self-pay | Admitting: Internal Medicine

## 2015-01-04 VITALS — BP 120/70 | HR 86 | Temp 98.6°F | Wt 234.0 lb

## 2015-01-04 DIAGNOSIS — I10 Essential (primary) hypertension: Secondary | ICD-10-CM | POA: Diagnosis not present

## 2015-01-04 DIAGNOSIS — K7689 Other specified diseases of liver: Secondary | ICD-10-CM

## 2015-01-04 DIAGNOSIS — D649 Anemia, unspecified: Secondary | ICD-10-CM | POA: Diagnosis not present

## 2015-01-04 DIAGNOSIS — E7800 Pure hypercholesterolemia, unspecified: Secondary | ICD-10-CM

## 2015-01-04 DIAGNOSIS — R945 Abnormal results of liver function studies: Secondary | ICD-10-CM

## 2015-01-04 DIAGNOSIS — E669 Obesity, unspecified: Secondary | ICD-10-CM

## 2015-01-04 DIAGNOSIS — Z0000 Encounter for general adult medical examination without abnormal findings: Secondary | ICD-10-CM

## 2015-01-04 DIAGNOSIS — R739 Hyperglycemia, unspecified: Secondary | ICD-10-CM
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
## 2015-01-14 ENCOUNTER — Encounter: Payer: Self-pay | Admitting: Internal Medicine

## 2015-01-14 DIAGNOSIS — Z0000 Encounter for general adult medical examination without abnormal findings: Secondary | ICD-10-CM | POA: Insufficient documentation

## 2015-01-14 NOTE — Assessment & Plan Note (Signed)
Physical 08/30/14.  Overdue mammogram.  Needs to schedule.

## 2015-01-14 NOTE — Assessment & Plan Note (Signed)
Better.  Hematoma better.  Some minimal left shoulder pain with reaching back.  Follow.

## 2015-01-14 NOTE — Assessment & Plan Note (Signed)
Low cholesterol diet and exercise.  Weight loss.  Follow liver panel.

## 2015-01-14 NOTE — Assessment & Plan Note (Signed)
Diet and exercise.   

## 2015-01-14 NOTE — Assessment & Plan Note (Signed)
Follow cbc and ferritin.  

## 2015-01-14 NOTE — Assessment & Plan Note (Signed)
Low cholesterol diet and exercise.  Follow fasting lipid profile.

## 2015-01-14 NOTE — Assessment & Plan Note (Signed)
Blood pressure doing well.  Same medication regimen.  Follow pressures.  Follow metabolic panel.   

## 2015-01-14 NOTE — Assessment & Plan Note (Signed)
Low carb diet and exercise.  Follow met b and a1c.   

## 2015-01-31 ENCOUNTER — Other Ambulatory Visit (INDEPENDENT_AMBULATORY_CARE_PROVIDER_SITE_OTHER): Payer: BC Managed Care – PPO

## 2015-01-31 DIAGNOSIS — R945 Abnormal results of liver function studies: Secondary | ICD-10-CM

## 2015-01-31 DIAGNOSIS — E7800 Pure hypercholesterolemia, unspecified: Secondary | ICD-10-CM

## 2015-01-31 DIAGNOSIS — I10 Essential (primary) hypertension: Secondary | ICD-10-CM | POA: Diagnosis not present

## 2015-01-31 DIAGNOSIS — K7689 Other specified diseases of liver: Secondary | ICD-10-CM

## 2015-01-31 DIAGNOSIS — R739 Hyperglycemia, unspecified: Secondary | ICD-10-CM | POA: Diagnosis not present

## 2015-01-31 LAB — LIPID PANEL
CHOL/HDL RATIO: 5
CHOLESTEROL: 188 mg/dL (ref 0–200)
HDL: 39.8 mg/dL (ref >39.00)
LDL CALC: 121 mg/dL — AB (ref 0–99)
NonHDL: 148.2
Triglycerides: 138 mg/dL (ref 0.0–149.0)
VLDL: 27.6 mg/dL (ref 0.0–40.0)

## 2015-01-31 LAB — HEPATIC FUNCTION PANEL
ALT: 123 U/L — ABNORMAL HIGH (ref 0–35)
AST: 89 U/L — AB (ref 0–37)
Albumin: 4.2 g/dL (ref 3.5–5.2)
Alkaline Phosphatase: 79 U/L (ref 39–117)
BILIRUBIN TOTAL: 0.4 mg/dL (ref 0.2–1.2)
Bilirubin, Direct: 0 mg/dL (ref 0.0–0.3)
Total Protein: 7.3 g/dL (ref 6.0–8.3)

## 2015-01-31 LAB — BASIC METABOLIC PANEL
BUN: 19 mg/dL (ref 6–23)
CALCIUM: 9.4 mg/dL (ref 8.4–10.5)
CO2: 27 mEq/L (ref 19–32)
Chloride: 103 mEq/L (ref 96–112)
Creatinine, Ser: 0.81 mg/dL (ref 0.40–1.20)
GFR: 78.25 mL/min (ref >60.00)
Glucose, Bld: 132 mg/dL — ABNORMAL HIGH (ref 70–99)
Potassium: 4.8 mEq/L (ref 3.5–5.1)
SODIUM: 136 meq/L (ref 135–145)

## 2015-01-31 LAB — MICROALBUMIN / CREATININE URINE RATIO
CREATININE, U: 89.8 mg/dL
MICROALB UR: 0.7 mg/dL (ref 0.0–1.9)
MICROALB/CREAT RATIO: 0.8 mg/g (ref 0.0–30.0)

## 2015-01-31 LAB — HEMOGLOBIN A1C: Hgb A1c MFr Bld: 6.4 % (ref 4.6–6.5)

## 2015-02-01 ENCOUNTER — Encounter: Payer: Self-pay | Admitting: Internal Medicine

## 2015-02-01 DIAGNOSIS — K5900 Constipation, unspecified: Secondary | ICD-10-CM

## 2015-02-01 NOTE — Telephone Encounter (Signed)
Order placed for tsh.  Pt notified via my chart.

## 2015-02-02 ENCOUNTER — Other Ambulatory Visit (INDEPENDENT_AMBULATORY_CARE_PROVIDER_SITE_OTHER): Payer: BC Managed Care – PPO

## 2015-02-02 DIAGNOSIS — K5900 Constipation, unspecified: Secondary | ICD-10-CM | POA: Diagnosis not present

## 2015-02-02 LAB — TSH: TSH: 2.82 u[IU]/mL (ref 0.35–4.50)

## 2015-02-04 ENCOUNTER — Encounter: Payer: Self-pay | Admitting: Internal Medicine

## 2015-05-10 ENCOUNTER — Other Ambulatory Visit (HOSPITAL_COMMUNITY)
Admission: RE | Admit: 2015-05-10 | Discharge: 2015-05-10 | Disposition: A | Discharge status: Home / Self Care | Payer: BC Managed Care – PPO | Source: Ambulatory Visit | Attending: Internal Medicine | Admitting: Internal Medicine

## 2015-05-10 ENCOUNTER — Ambulatory Visit (INDEPENDENT_AMBULATORY_CARE_PROVIDER_SITE_OTHER): Payer: BC Managed Care – PPO | Admitting: Internal Medicine

## 2015-05-10 ENCOUNTER — Encounter: Payer: Self-pay | Admitting: Internal Medicine

## 2015-05-10 VITALS — BP 110/72 | HR 78 | Temp 98.7°F | Ht 62.0 in | Wt 238.5 lb

## 2015-05-10 DIAGNOSIS — Z0000 Encounter for general adult medical examination without abnormal findings: Secondary | ICD-10-CM

## 2015-05-10 DIAGNOSIS — Z124 Encounter for screening for malignant neoplasm of cervix: Secondary | ICD-10-CM

## 2015-05-10 DIAGNOSIS — N63 Unspecified lump in unspecified breast: Secondary | ICD-10-CM

## 2015-05-10 DIAGNOSIS — D649 Anemia, unspecified: Secondary | ICD-10-CM

## 2015-05-10 DIAGNOSIS — I10 Essential (primary) hypertension: Secondary | ICD-10-CM

## 2015-05-10 DIAGNOSIS — Z01419 Encounter for gynecological examination (general) (routine) without abnormal findings: Secondary | ICD-10-CM | POA: Diagnosis present

## 2015-05-10 DIAGNOSIS — R945 Abnormal results of liver function studies: Secondary | ICD-10-CM

## 2015-05-10 DIAGNOSIS — K629 Disease of anus and rectum, unspecified: Secondary | ICD-10-CM

## 2015-05-10 DIAGNOSIS — E7800 Pure hypercholesterolemia, unspecified: Secondary | ICD-10-CM

## 2015-05-10 DIAGNOSIS — K7689 Other specified diseases of liver: Secondary | ICD-10-CM

## 2015-05-10 DIAGNOSIS — E669 Obesity, unspecified: Secondary | ICD-10-CM

## 2015-05-10 DIAGNOSIS — N6489 Other specified disorders of breast: Secondary | ICD-10-CM

## 2015-05-10 DIAGNOSIS — R739 Hyperglycemia, unspecified: Secondary | ICD-10-CM

## 2015-05-10 DIAGNOSIS — Z1151 Encounter for screening for human papillomavirus (HPV): Secondary | ICD-10-CM | POA: Insufficient documentation

## 2015-05-10 MED ORDER — MUPIROCIN 2 % EX OINT
TOPICAL_OINTMENT | CUTANEOUS | Status: DC
Start: 2015-05-10 — End: 2015-10-19

## 2015-05-10 MED ORDER — ATORVASTATIN CALCIUM 10 MG PO TABS: 10.0000 mg | ORAL_TABLET | Freq: Every day | ORAL | Status: DC
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
## 2015-05-10 NOTE — Progress Notes (Signed)
Pre-visit discussion using our clinic review tool. No additional management support is needed unless otherwise documented below in the visit note.  

## 2015-05-13 ENCOUNTER — Encounter: Payer: Self-pay | Admitting: Internal Medicine

## 2015-05-13 DIAGNOSIS — K629 Disease of anus and rectum, unspecified: Secondary | ICD-10-CM | POA: Insufficient documentation

## 2015-05-13 NOTE — Assessment & Plan Note (Signed)
Blood pressure under good control.  Continue same medication regimen.  Follow pressures.  Follow metabolic panel.   

## 2015-05-13 NOTE — Assessment & Plan Note (Signed)
Physical 05/10/15. PAP 05/10/15.  Schedule mammogram.

## 2015-05-13 NOTE — Assessment & Plan Note (Signed)
Follow cbc and ferritin.  

## 2015-05-13 NOTE — Assessment & Plan Note (Signed)
Has mostly reabsorbed.  Persistent nodule - right breast as outlined.  Schedule bilateral diagnostic mammogram with ultrasound as outlined.

## 2015-05-13 NOTE — Assessment & Plan Note (Signed)
Low cholesterol diet and exercise.  Weight loss.  Follow liver panel.

## 2015-05-13 NOTE — Assessment & Plan Note (Signed)
Persistent anal lesion.  Is better.  Has drained.  bactroban - topical.  Notify me if persistent.

## 2015-05-13 NOTE — Assessment & Plan Note (Signed)
Diet and exercise.   

## 2015-05-13 NOTE — Assessment & Plan Note (Signed)
Low carb diet and exercise.  Follow met b and a1c.   

## 2015-05-13 NOTE — Assessment & Plan Note (Signed)
Low cholesterol diet and exercise.  Follow lipid panel.  Weight loss.

## 2015-05-15 LAB — CYTOLOGY - PAP

## 2015-05-16 ENCOUNTER — Other Ambulatory Visit: Payer: BC Managed Care – PPO

## 2015-05-16 ENCOUNTER — Ambulatory Visit: Payer: BC Managed Care – PPO

## 2015-05-16 ENCOUNTER — Encounter: Payer: Self-pay | Admitting: Internal Medicine

## 2015-05-18 ENCOUNTER — Other Ambulatory Visit (INDEPENDENT_AMBULATORY_CARE_PROVIDER_SITE_OTHER): Payer: BC Managed Care – PPO

## 2015-05-18 DIAGNOSIS — K7689 Other specified diseases of liver: Secondary | ICD-10-CM

## 2015-05-18 DIAGNOSIS — E7800 Pure hypercholesterolemia, unspecified: Secondary | ICD-10-CM

## 2015-05-18 DIAGNOSIS — R945 Abnormal results of liver function studies: Secondary | ICD-10-CM

## 2015-05-18 DIAGNOSIS — R739 Hyperglycemia, unspecified: Secondary | ICD-10-CM

## 2015-05-18 DIAGNOSIS — I10 Essential (primary) hypertension: Secondary | ICD-10-CM

## 2015-05-18 LAB — LIPID PANEL
Cholesterol: 183 mg/dL (ref 0–200)
HDL: 33.6 mg/dL — AB (ref >39.00)
LDL Cholesterol: 119 mg/dL — ABNORMAL HIGH (ref 0–99)
NONHDL: 149.09
TRIGLYCERIDES: 150 mg/dL — AB (ref 0.0–149.0)
Total CHOL/HDL Ratio: 5
VLDL: 30 mg/dL (ref 0.0–40.0)

## 2015-05-18 LAB — BASIC METABOLIC PANEL
BUN: 14 mg/dL (ref 6–23)
CALCIUM: 9.4 mg/dL (ref 8.4–10.5)
CO2: 28 meq/L (ref 19–32)
CREATININE: 0.79 mg/dL (ref 0.40–1.20)
Chloride: 104 mEq/L (ref 96–112)
GFR: 80.45 mL/min (ref >60.00)
GLUCOSE: 124 mg/dL — AB (ref 70–99)
Potassium: 4.2 mEq/L (ref 3.5–5.1)
Sodium: 141 mEq/L (ref 135–145)

## 2015-05-18 LAB — HEPATIC FUNCTION PANEL
ALBUMIN: 4.1 g/dL (ref 3.5–5.2)
ALK PHOS: 79 U/L (ref 39–117)
ALT: 103 U/L — ABNORMAL HIGH (ref 0–35)
AST: 69 U/L — ABNORMAL HIGH (ref 0–37)
Bilirubin, Direct: 0.1 mg/dL (ref 0.0–0.3)
Total Bilirubin: 0.4 mg/dL (ref 0.2–1.2)
Total Protein: 7.1 g/dL (ref 6.0–8.3)

## 2015-05-18 LAB — HEMOGLOBIN A1C: Hgb A1c MFr Bld: 7.2 % — ABNORMAL HIGH (ref 4.6–6.5)

## 2015-05-22 ENCOUNTER — Encounter: Payer: Self-pay | Admitting: *Deleted

## 2015-05-29 ENCOUNTER — Ambulatory Visit: Payer: BC Managed Care – PPO | Admitting: Internal Medicine

## 2015-05-30 ENCOUNTER — Ambulatory Visit
Admission: RE | Admit: 2015-05-30 | Discharge: 2015-05-30 | Disposition: A | Discharge status: Home / Self Care | Payer: BC Managed Care – PPO | Source: Ambulatory Visit | Attending: Internal Medicine | Admitting: Internal Medicine

## 2015-05-30 DIAGNOSIS — N63 Unspecified lump in unspecified breast: Secondary | ICD-10-CM

## 2015-05-30 DIAGNOSIS — R928 Other abnormal and inconclusive findings on diagnostic imaging of breast: Secondary | ICD-10-CM | POA: Insufficient documentation

## 2015-06-07 ENCOUNTER — Other Ambulatory Visit: Payer: BC Managed Care – PPO

## 2015-07-06 ENCOUNTER — Encounter: Payer: Self-pay | Admitting: Internal Medicine

## 2015-07-16 ENCOUNTER — Ambulatory Visit: Payer: BC Managed Care – PPO | Admitting: Internal Medicine

## 2015-08-23 ENCOUNTER — Other Ambulatory Visit: Payer: Self-pay | Admitting: Internal Medicine

## 2015-08-24 ENCOUNTER — Other Ambulatory Visit: Payer: Self-pay | Admitting: Internal Medicine

## 2015-09-13 ENCOUNTER — Ambulatory Visit: Payer: BC Managed Care – PPO | Admitting: Internal Medicine

## 2015-10-19 ENCOUNTER — Encounter: Payer: Self-pay | Admitting: Internal Medicine

## 2015-10-19 ENCOUNTER — Ambulatory Visit: Payer: Self-pay | Admitting: Internal Medicine

## 2015-10-19 ENCOUNTER — Ambulatory Visit (INDEPENDENT_AMBULATORY_CARE_PROVIDER_SITE_OTHER): Payer: BC Managed Care – PPO | Admitting: Internal Medicine

## 2015-10-19 VITALS — BP 124/80 | HR 67 | Temp 97.8°F | Resp 18 | Ht 62.0 in | Wt 239.0 lb

## 2015-10-19 DIAGNOSIS — I10 Essential (primary) hypertension: Secondary | ICD-10-CM

## 2015-10-19 DIAGNOSIS — E669 Obesity, unspecified: Secondary | ICD-10-CM

## 2015-10-19 DIAGNOSIS — R945 Abnormal results of liver function studies: Secondary | ICD-10-CM

## 2015-10-19 DIAGNOSIS — D649 Anemia, unspecified: Secondary | ICD-10-CM

## 2015-10-19 DIAGNOSIS — E7800 Pure hypercholesterolemia, unspecified: Secondary | ICD-10-CM | POA: Diagnosis not present

## 2015-10-19 DIAGNOSIS — R739 Hyperglycemia, unspecified: Secondary | ICD-10-CM | POA: Diagnosis not present

## 2015-10-19 DIAGNOSIS — K7689 Other specified diseases of liver: Secondary | ICD-10-CM | POA: Diagnosis not present

## 2015-10-19 LAB — BASIC METABOLIC PANEL
BUN: 17 mg/dL (ref 6–23)
CHLORIDE: 104 meq/L (ref 96–112)
CO2: 30 mEq/L (ref 19–32)
CREATININE: 0.81 mg/dL (ref 0.40–1.20)
Calcium: 10 mg/dL (ref 8.4–10.5)
GFR: 78.04 mL/min (ref >60.00)
Glucose, Bld: 125 mg/dL — ABNORMAL HIGH (ref 70–99)
Potassium: 4.7 mEq/L (ref 3.5–5.1)
Sodium: 141 mEq/L (ref 135–145)

## 2015-10-19 LAB — LIPID PANEL
CHOLESTEROL: 192 mg/dL (ref 0–200)
HDL: 39.1 mg/dL (ref >39.00)
LDL Cholesterol: 123 mg/dL — ABNORMAL HIGH (ref 0–99)
NonHDL: 152.78
TRIGLYCERIDES: 147 mg/dL (ref 0.0–149.0)
Total CHOL/HDL Ratio: 5
VLDL: 29.4 mg/dL (ref 0.0–40.0)

## 2015-10-19 LAB — CBC WITH DIFFERENTIAL/PLATELET
BASOS PCT: 0.7 % (ref 0.0–3.0)
Basophils Absolute: 0 10*3/uL (ref 0.0–0.1)
EOS PCT: 4 % (ref 0.0–5.0)
Eosinophils Absolute: 0.2 10*3/uL (ref 0.0–0.7)
HEMATOCRIT: 44.3 % (ref 36.0–46.0)
Hemoglobin: 14.7 g/dL (ref 12.0–15.0)
LYMPHS PCT: 27.8 % (ref 12.0–46.0)
Lymphs Abs: 1.3 10*3/uL (ref 0.7–4.0)
MCHC: 33.3 g/dL (ref 30.0–36.0)
MCV: 83.2 fl (ref 78.0–100.0)
MONOS PCT: 6.6 % (ref 3.0–12.0)
Monocytes Absolute: 0.3 10*3/uL (ref 0.1–1.0)
NEUTROS ABS: 2.9 10*3/uL (ref 1.4–7.7)
Neutrophils Relative %: 60.9 % (ref 43.0–77.0)
PLATELETS: 228 10*3/uL (ref 150.0–400.0)
RBC: 5.32 Mil/uL — ABNORMAL HIGH (ref 3.87–5.11)
RDW: 13.4 % (ref 11.5–15.5)
WBC: 4.8 10*3/uL (ref 4.0–10.5)

## 2015-10-19 LAB — HEPATIC FUNCTION PANEL
ALT: 153 U/L — AB (ref 0–35)
AST: 118 U/L — ABNORMAL HIGH (ref 0–37)
Albumin: 4.5 g/dL (ref 3.5–5.2)
Alkaline Phosphatase: 85 U/L (ref 39–117)
Bilirubin, Direct: 0.1 mg/dL (ref 0.0–0.3)
TOTAL PROTEIN: 7.9 g/dL (ref 6.0–8.3)
Total Bilirubin: 0.6 mg/dL (ref 0.2–1.2)

## 2015-10-19 LAB — HEMOGLOBIN A1C: HEMOGLOBIN A1C: 6.8 % — AB (ref 4.6–6.5)

## 2015-10-19 MED ORDER — ATORVASTATIN CALCIUM 10 MG PO TABS: 10.0000 mg | ORAL_TABLET | Freq: Every day | ORAL | Status: DC

## 2015-10-19 MED ORDER — LISINOPRIL 10 MG PO TABS
ORAL_TABLET | ORAL | Status: DC
Start: 2015-10-19 — End: 2016-12-18

## 2015-10-19 NOTE — Progress Notes (Signed)
Pre-visit discussion using our clinic review tool. No additional management support is needed unless otherwise documented below in the visit note.  
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
## 2015-10-21 ENCOUNTER — Other Ambulatory Visit: Payer: Self-pay | Admitting: Internal Medicine

## 2015-10-21 ENCOUNTER — Encounter: Payer: Self-pay | Admitting: Internal Medicine

## 2015-10-21 DIAGNOSIS — R7989 Other specified abnormal findings of blood chemistry: Secondary | ICD-10-CM

## 2015-10-21 DIAGNOSIS — R945 Abnormal results of liver function studies: Secondary | ICD-10-CM

## 2015-10-21 NOTE — Assessment & Plan Note (Signed)
Discussed diet and exercise.  Discussed the need for weight loss.  Discussed the importance of keeping sugars under good control.  Recheck liver panel today.

## 2015-10-21 NOTE — Assessment & Plan Note (Signed)
Discussed diet and exercise 

## 2015-10-21 NOTE — Assessment & Plan Note (Signed)
Low cholesterol diet and exercise.  Follow lipid panel and liver function tests.  On lipitor.   

## 2015-10-21 NOTE — Assessment & Plan Note (Signed)
Follow cbc and ferritin.  

## 2015-10-21 NOTE — Assessment & Plan Note (Signed)
Blood pressure has been under good control.  Slightly elevated today above normal readings.  Follow pressures.  Continue same medication regimen.  Get her back in soon to reassess.  Check metabolic panel.

## 2015-10-21 NOTE — Assessment & Plan Note (Signed)
Low carb diet and exercise.  Follow met b and a1c.  Recent a1c elevated - diabetes.  Discussed need to keep sugars under control and weight loss.
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
## 2015-10-21 NOTE — Progress Notes (Signed)
Order placed for f/u liver panel.

## 2015-10-23 ENCOUNTER — Other Ambulatory Visit: Payer: Self-pay | Admitting: Internal Medicine

## 2015-10-23 DIAGNOSIS — R7989 Other specified abnormal findings of blood chemistry: Secondary | ICD-10-CM

## 2015-10-23 DIAGNOSIS — R945 Abnormal results of liver function studies: Secondary | ICD-10-CM

## 2015-10-23 NOTE — Progress Notes (Signed)
Order placed for referral to GI and for f/u liver panel.

## 2015-10-24 ENCOUNTER — Encounter: Payer: Self-pay | Admitting: Internal Medicine

## 2015-10-30 ENCOUNTER — Encounter: Payer: Self-pay | Admitting: *Deleted

## 2015-11-02 NOTE — Telephone Encounter (Signed)
[facility] should be able to see the orders.  I don't think she has to schedule an appt.  My understanding is that she just shows up for lab.

## 2015-11-07 ENCOUNTER — Other Ambulatory Visit
Admission: RE | Admit: 2015-11-07 | Discharge: 2015-11-07 | Disposition: A | Discharge status: Home / Self Care | Payer: BC Managed Care – PPO | Source: Ambulatory Visit | Attending: Internal Medicine | Admitting: Internal Medicine

## 2015-11-07 ENCOUNTER — Encounter: Payer: Self-pay | Admitting: Internal Medicine

## 2015-11-07 DIAGNOSIS — R7989 Other specified abnormal findings of blood chemistry: Secondary | ICD-10-CM | POA: Insufficient documentation

## 2015-11-07 DIAGNOSIS — R945 Abnormal results of liver function studies: Secondary | ICD-10-CM

## 2015-11-07 LAB — HEPATIC FUNCTION PANEL
ALK PHOS: 86 U/L (ref 38–126)
ALT: 128 U/L — ABNORMAL HIGH (ref 14–54)
AST: 126 U/L — ABNORMAL HIGH (ref 15–41)
Albumin: 4.5 g/dL (ref 3.5–5.0)
BILIRUBIN DIRECT: 0.1 mg/dL (ref 0.1–0.5)
BILIRUBIN INDIRECT: 0.4 mg/dL (ref 0.3–0.9)
BILIRUBIN TOTAL: 0.5 mg/dL (ref 0.3–1.2)
TOTAL PROTEIN: 7.7 g/dL (ref 6.5–8.1)

## 2015-11-28 ENCOUNTER — Other Ambulatory Visit: Payer: Self-pay | Admitting: Gastroenterology

## 2015-11-28 DIAGNOSIS — K760 Fatty (change of) liver, not elsewhere classified: Secondary | ICD-10-CM

## 2015-12-04 ENCOUNTER — Ambulatory Visit
Admission: RE | Admit: 2015-12-04 | Discharge: 2015-12-04 | Disposition: A | Discharge status: Home / Self Care | Payer: BC Managed Care – PPO | Source: Ambulatory Visit | Attending: Gastroenterology | Admitting: Gastroenterology

## 2015-12-04 DIAGNOSIS — K8020 Calculus of gallbladder without cholecystitis without obstruction: Secondary | ICD-10-CM | POA: Insufficient documentation

## 2015-12-04 DIAGNOSIS — R748 Abnormal levels of other serum enzymes: Secondary | ICD-10-CM | POA: Insufficient documentation

## 2015-12-04 DIAGNOSIS — K760 Fatty (change of) liver, not elsewhere classified: Secondary | ICD-10-CM | POA: Insufficient documentation

## 2015-12-04 DIAGNOSIS — N281 Cyst of kidney, acquired: Secondary | ICD-10-CM | POA: Diagnosis not present

## 2015-12-31 ENCOUNTER — Other Ambulatory Visit: Payer: Self-pay | Admitting: Gastroenterology

## 2015-12-31 ENCOUNTER — Ambulatory Visit (INDEPENDENT_AMBULATORY_CARE_PROVIDER_SITE_OTHER): Payer: BC Managed Care – PPO | Admitting: Internal Medicine

## 2015-12-31 ENCOUNTER — Encounter: Payer: Self-pay | Admitting: Internal Medicine

## 2015-12-31 VITALS — BP 110/70 | HR 75 | Temp 98.6°F | Resp 18 | Ht 62.0 in | Wt 237.4 lb

## 2015-12-31 DIAGNOSIS — R739 Hyperglycemia, unspecified: Secondary | ICD-10-CM | POA: Diagnosis not present

## 2015-12-31 DIAGNOSIS — E669 Obesity, unspecified: Secondary | ICD-10-CM

## 2015-12-31 DIAGNOSIS — I10 Essential (primary) hypertension: Secondary | ICD-10-CM

## 2015-12-31 DIAGNOSIS — R1319 Other dysphagia: Secondary | ICD-10-CM

## 2015-12-31 DIAGNOSIS — R5383 Other fatigue: Secondary | ICD-10-CM | POA: Diagnosis not present

## 2015-12-31 DIAGNOSIS — R945 Abnormal results of liver function studies: Secondary | ICD-10-CM

## 2015-12-31 DIAGNOSIS — R1310 Dysphagia, unspecified: Secondary | ICD-10-CM

## 2015-12-31 DIAGNOSIS — Z1239 Encounter for other screening for malignant neoplasm of breast: Secondary | ICD-10-CM

## 2015-12-31 DIAGNOSIS — K7689 Other specified diseases of liver: Secondary | ICD-10-CM

## 2015-12-31 DIAGNOSIS — E7800 Pure hypercholesterolemia, unspecified: Secondary | ICD-10-CM

## 2015-12-31 LAB — CBC WITH DIFFERENTIAL/PLATELET
BASOS ABS: 0 10*3/uL (ref 0.0–0.1)
Basophils Relative: 0.5 % (ref 0.0–3.0)
EOS PCT: 2 % (ref 0.0–5.0)
Eosinophils Absolute: 0.1 10*3/uL (ref 0.0–0.7)
HCT: 42.8 % (ref 36.0–46.0)
HEMOGLOBIN: 14.6 g/dL (ref 12.0–15.0)
LYMPHS ABS: 1.2 10*3/uL (ref 0.7–4.0)
Lymphocytes Relative: 26.5 % (ref 12.0–46.0)
MCHC: 34.2 g/dL (ref 30.0–36.0)
MCV: 82.4 fl (ref 78.0–100.0)
MONO ABS: 0.3 10*3/uL (ref 0.1–1.0)
MONOS PCT: 6 % (ref 3.0–12.0)
NEUTROS PCT: 65 % (ref 43.0–77.0)
Neutro Abs: 2.9 10*3/uL (ref 1.4–7.7)
Platelets: 227 10*3/uL (ref 150.0–400.0)
RBC: 5.19 Mil/uL — AB (ref 3.87–5.11)
RDW: 13.5 % (ref 11.5–15.5)
WBC: 4.4 10*3/uL (ref 4.0–10.5)

## 2015-12-31 LAB — BASIC METABOLIC PANEL
BUN: 13 mg/dL (ref 6–23)
CHLORIDE: 101 meq/L (ref 96–112)
CO2: 27 mEq/L (ref 19–32)
Calcium: 10.1 mg/dL (ref 8.4–10.5)
Creatinine, Ser: 0.76 mg/dL (ref 0.40–1.20)
GFR: 83.94 mL/min (ref >60.00)
Glucose, Bld: 101 mg/dL — ABNORMAL HIGH (ref 70–99)
POTASSIUM: 4.5 meq/L (ref 3.5–5.1)
SODIUM: 140 meq/L (ref 135–145)

## 2015-12-31 LAB — VITAMIN B12: Vitamin B-12: 544 pg/mL (ref 211–911)

## 2015-12-31 LAB — TSH: TSH: 1.91 u[IU]/mL (ref 0.35–4.50)

## 2015-12-31 NOTE — Progress Notes (Signed)
Pre-visit discussion using our clinic review tool. No additional management support is needed unless otherwise documented below in the visit note.  

## 2015-12-31 NOTE — Progress Notes (Signed)
Patient ID: DANAKA LLERA, female   DOB: [birth year], 55 y.o.   MRN: [id number]   Subjective:    Patient ID: Roxanne Gates, female    DOB: November 03, 1960, 55 y.o.   MRN: [id number]  HPI  Patient here for a scheduled follow up.  Seeing GI for abnormal liver function tests.  W/up in progress.  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  Planning for liver biopsy in the next 2-3 weeks.  Discussed diet and exercise. Discussed stopping cholesterol medication until GI w/up complete.  She feels she is doing better regarding the increased stress.  Breathing stable.  No chest pain.  No sob.  No acid reflux.  No abdominal pain or cramping.  Bowels stable.     Past Medical History  Diagnosis Date  . Abnormal liver function   . Allergy   . Heart murmur   . Hypertension   . Hypercholesterolemia   . GERD (gastroesophageal reflux disease)   . Hyperglycemia   . Anemia    Past Surgical History  Procedure Laterality Date  . Breast surgery  2008  . Tonsillectomy and adenoidectomy  1969  . Abdominal hysterectomy  2010  . Rotator cuff repair  2006  . Septoplasty  1990  . Lumbar microdiscectomy  2004  . Achilles tendon repair  2009  . Breast biopsy Left 07/30/2006    benign  . Breast biopsy Left 02/18/2007 & 03/05/2007    benign   Family History  Problem Relation Age of Onset  . Hyperlipidemia Mother   . Hypertension Mother   . Stroke Mother   . Heart disease Mother   . Hypothyroidism Mother   . Hyperlipidemia Maternal Grandmother   . Ulcers Maternal Grandmother   . Ovarian cancer Maternal Aunt   . Breast cancer Paternal Aunt   . Diabetes    . Lung cancer Father     Hx smoker  . Hypertension Maternal Grandfather     MI  . Kidney cancer Paternal Grandmother   . Hypothyroidism      aunt x 2  . Pernicious anemia Mother     aunt x 2  . Heart disease Maternal Grandfather     myocardial infarction  . Colon cancer Neg Hx    Social History   Social History  . Marital Status: Single      Spouse Name: N/A  . Number of Children: 0  . Years of Education: N/A   Social History Main Topics  . Smoking status: Never Smoker   . Smokeless tobacco: Never Used  . Alcohol Use: 0.0 oz/week    0 Standard drinks or equivalent per week     Comment: occasional  . Drug Use: No  . Sexual Activity: Not Asked   Other Topics Concern  . None   Social History Narrative   She is single and has no children. She works at [employer] as an Web designer.     Outpatient Encounter Prescriptions as of 12/31/2015  Medication Sig  . atorvastatin (LIPITOR) 10 MG tablet Take 1 tablet (10 mg total) by mouth daily.  . clobetasol ointment (TEMOVATE) 0.56 % Apply 1 application topically 2 (two) times daily.  . fluticasone (FLONASE) 50 MCG/ACT nasal spray Place 2 sprays into the nose as needed for rhinitis.  Marland Kitchen lisinopril (PRINIVIL,ZESTRIL) 10 MG tablet Take 1 tablet (10 mg total) by mouth daily.  Marland Kitchen loratadine (CLARITIN) 10 MG tablet Take 10 mg by mouth daily.   No facility-administered encounter medications  on file as of 12/31/2015.    Review of Systems  Constitutional: Negative for appetite change and unexpected weight change.  HENT: Negative for congestion and sinus pressure.   Respiratory: Negative for cough, chest tightness and shortness of breath.   Cardiovascular: Negative for chest pain, palpitations and leg swelling.  Gastrointestinal: Negative for nausea, vomiting, abdominal pain and diarrhea.  Genitourinary: Negative for dysuria and difficulty urinating.  Musculoskeletal: Negative for back pain and joint swelling.  Skin: Negative for color change and rash.  Neurological: Negative for dizziness, light-headedness and headaches.  Psychiatric/Behavioral: Negative for dysphoric mood and agitation.       Objective:    Physical Exam  Constitutional: She appears well-developed and well-nourished. No distress.  HENT:  Nose: Nose normal.  Mouth/Throat: Oropharynx is  clear and moist.  Neck: Neck supple. No thyromegaly present.  Cardiovascular: Normal rate and regular rhythm.   Pulmonary/Chest: Breath sounds normal. No respiratory distress. She has no wheezes.  Abdominal: Soft. Bowel sounds are normal. There is no tenderness.  Musculoskeletal: She exhibits no edema or tenderness.  Lymphadenopathy:    She has no cervical adenopathy.  Skin: No rash noted. No erythema.  Psychiatric: She has a normal mood and affect. Her behavior is normal.    BP 110/70 mmHg  Pulse 75  Temp(Src) 98.6 F (37 C) (Oral)  Resp 18  Ht _0  (1.575 m)  Wt 237 lb 6 oz (107.673 kg)  BMI 43.41 kg/m2  SpO2 95% Wt Readings from Last 3 Encounters:  12/31/15 237 lb 6 oz (107.673 kg)  10/19/15 239 lb (108.41 kg)  05/10/15 238 lb 8 oz (108.183 kg)     Lab Results  Component Value Date   WBC 4.4 12/31/2015   HGB 14.6 12/31/2015   HCT 42.8 12/31/2015   PLT 227.0 12/31/2015   GLUCOSE 101* 12/31/2015   CHOL 192 10/19/2015   TRIG 147.0 10/19/2015   HDL 39.10 10/19/2015   LDLDIRECT 171.8 08/25/2013   LDLCALC 123* 10/19/2015   ALT 128* 11/07/2015   AST 126* 11/07/2015   NA 140 12/31/2015   K 4.5 12/31/2015   CL 101 12/31/2015   CREATININE 0.76 12/31/2015   BUN 13 12/31/2015   CO2 27 12/31/2015   TSH 1.91 12/31/2015   HGBA1C 6.8* 10/19/2015   MICROALBUR 0.7 01/31/2015    US Abdomen Complete  12/04/2015  CLINICAL DATA:  Nonalcoholic fatty liver disease, elevated LFTs EXAM: ULTRASOUND ABDOMEN COMPLETE ULTRASOUND HEPATIC ELASTOGRAPHY TECHNIQUE: Sonography of the upper abdomen was performed. In addition, ultrasound elastography evaluation of the liver was performed. A region of interest was placed within the right lobe of the liver. Following application of a compressive sonographic pulse, shear waves were detected in the adjacent hepatic tissue and the shear wave velocity was calculated. Multiple assessments were performed at the selected site. Median shear wave velocity  is correlated to a Metavir fibrosis score. COMPARISON:  None. FINDINGS: ULTRASOUND ABDOMEN Gallbladder: Multiple layering gallstones measuring up to 11 mm. No gallbladder wall thickening or pericholecystic fluid. Negative sonographic Murphy sign Common bile duct: Diameter: 6 mm Liver: Hyperechoic hepatic parenchyma with coarse hepatic echotexture. No focal hepatic lesion is seen IVC: No abnormality visualized. Pancreas: Poorly visualized due to overlying bowel gas. Spleen: Size and appearance within normal limits. Right Kidney: Length: 11.4 cm. 2.3 x 1.8 x 2.1 cm interpolar cyst with irregular margins but no internal complexity. No hydronephrosis. Left Kidney: Length: 11.8 cm. No mass or hydronephrosis. Abdominal aorta: No aneurysm visualized. Other findings: None.  ULTRASOUND HEPATIC ELASTOGRAPHY Device: Siemens Helix VTQ Patient position:  Left Lateral Decubitus Transducer 6C1 Number of measurements:  10 Hepatic Segment:  8 Median velocity:   3.53  m/sec IQR: 0.66 IQR/Median velocity ratio 0.19 Corresponding Metavir fibrosis score:  Some F3 + F4 Risk of fibrosis: High Limitations of exam: None Pertinent findings noted on other imaging exams:  None Please note that abnormal shear wave velocities may also be identified in clinical settings other than with hepatic fibrosis, such as: acute hepatitis, elevated right heart and central venous pressures including use of beta blockers, veno-occlusive disease (Budd-Chiari), infiltrative processes such as mastocytosis/amyloidosis/infiltrative tumor, extrahepatic cholestasis, in the post-prandial state, and liver transplantation. Correlation with patient history, laboratory data, and clinical condition recommended. IMPRESSION: Hyperechoic hepatic parenchyma with coarse hepatic echotexture, suggesting hepatocellular disease such as hepatic steatosis and/or cirrhosis. No focal hepatic lesion is seen. Cholelithiasis, without associated sonographic findings to suggest acute  cholecystitis. 2.3 cm interpolar right renal cyst, benign. Median hepatic shear wave velocity is calculated at 3.53 m/sec. Corresponding Metavir fibrosis score is Some F3 + F4. Risk of fibrosis is high. Follow-up:  Followup Advised Electronically Signed   By: Julian Hy M.D.   On: 12/04/2015 10:02   [place] Arfi Elastography Add On  12/04/2015  CLINICAL DATA:  Nonalcoholic fatty liver disease, elevated LFTs EXAM: ULTRASOUND ABDOMEN COMPLETE ULTRASOUND HEPATIC ELASTOGRAPHY TECHNIQUE: Sonography of the upper abdomen was performed. In addition, ultrasound elastography evaluation of the liver was performed. A region of interest was placed within the right lobe of the liver. Following application of a compressive sonographic pulse, shear waves were detected in the adjacent hepatic tissue and the shear wave velocity was calculated. Multiple assessments were performed at the selected site. Median shear wave velocity is correlated to a Metavir fibrosis score. COMPARISON:  None. FINDINGS: ULTRASOUND ABDOMEN Gallbladder: Multiple layering gallstones measuring up to 11 mm. No gallbladder wall thickening or pericholecystic fluid. Negative sonographic Murphy sign Common bile duct: Diameter: 6 mm Liver: Hyperechoic hepatic parenchyma with coarse hepatic echotexture. No focal hepatic lesion is seen IVC: No abnormality visualized. Pancreas: Poorly visualized due to overlying bowel gas. Spleen: Size and appearance within normal limits. Right Kidney: Length: 11.4 cm. 2.3 x 1.8 x 2.1 cm interpolar cyst with irregular margins but no internal complexity. No hydronephrosis. Left Kidney: Length: 11.8 cm. No mass or hydronephrosis. Abdominal aorta: No aneurysm visualized. Other findings: None. ULTRASOUND HEPATIC ELASTOGRAPHY Device: Siemens Helix VTQ Patient position:  Left Lateral Decubitus Transducer 6C1 Number of measurements:  10 Hepatic Segment:  8 Median velocity:   3.53  m/sec IQR: 0.66 IQR/Median velocity ratio 0.19  Corresponding Metavir fibrosis score:  Some F3 + F4 Risk of fibrosis: High Limitations of exam: None Pertinent findings noted on other imaging exams:  None Please note that abnormal shear wave velocities may also be identified in clinical settings other than with hepatic fibrosis, such as: acute hepatitis, elevated right heart and central venous pressures including use of beta blockers, veno-occlusive disease (Budd-Chiari), infiltrative processes such as mastocytosis/amyloidosis/infiltrative tumor, extrahepatic cholestasis, in the post-prandial state, and liver transplantation. Correlation with patient history, laboratory data, and clinical condition recommended. IMPRESSION: Hyperechoic hepatic parenchyma with coarse hepatic echotexture, suggesting hepatocellular disease such as hepatic steatosis and/or cirrhosis. No focal hepatic lesion is seen. Cholelithiasis, without associated sonographic findings to suggest acute cholecystitis. 2.3 cm interpolar right renal cyst, benign. Median hepatic shear wave velocity is calculated at 3.53 m/sec. Corresponding Metavir fibrosis score is Some F3 + F4. Risk of fibrosis is  high. Follow-up:  Followup Advised Electronically Signed   By: Julian Hy M.D.   On: 12/04/2015 10:02       Assessment & Plan:   Problem List Items Addressed This Visit    Abnormal liver function    W/up in progress.  Concern over primary biliary cirrhosis.  Planning for liver biopsy.        Hypercholesteremia    Low cholesterol diet and exercise.  Follow lipid panel.  Currently taking lipitor.  Discussed stopping the medication until GI w/up complete.        Hyperglycemia    Low carb diet and exercise.  Follow met b and a1c.        Hypertension    Blood pressure under good control.  Continue same medication regimen.  Follow pressures.  Follow metabolic panel.        Obesity (BMI 30-39.9)    Diet and exercise.        Other Visit Diagnoses    Screening breast examination     -  Primary    Relevant Orders    MM DIGITAL SCREENING BILATERAL    Other fatigue        Relevant Orders    CBC with Differential/Platelet (Completed)    TSH (Completed)    Vitamin B12 (Completed)    Basic metabolic panel (Completed)        Einar Pheasant, MD

## 2016-01-01 ENCOUNTER — Encounter: Payer: Self-pay | Admitting: Internal Medicine

## 2016-01-02 ENCOUNTER — Other Ambulatory Visit: Payer: Self-pay | Admitting: Gastroenterology

## 2016-01-02 DIAGNOSIS — R945 Abnormal results of liver function studies: Principal | ICD-10-CM

## 2016-01-02 DIAGNOSIS — R7989 Other specified abnormal findings of blood chemistry: Secondary | ICD-10-CM

## 2016-01-07 ENCOUNTER — Encounter: Payer: Self-pay | Admitting: Internal Medicine

## 2016-01-07 ENCOUNTER — Ambulatory Visit
Admission: RE | Admit: 2016-01-07 | Discharge: 2016-01-07 | Disposition: A | Discharge status: Home / Self Care | Payer: BC Managed Care – PPO | Source: Ambulatory Visit | Attending: Gastroenterology | Admitting: Gastroenterology

## 2016-01-07 DIAGNOSIS — R1319 Other dysphagia: Secondary | ICD-10-CM

## 2016-01-07 DIAGNOSIS — K219 Gastro-esophageal reflux disease without esophagitis: Secondary | ICD-10-CM | POA: Insufficient documentation

## 2016-01-07 DIAGNOSIS — R1314 Dysphagia, pharyngoesophageal phase: Secondary | ICD-10-CM | POA: Diagnosis present

## 2016-01-07 DIAGNOSIS — R1310 Dysphagia, unspecified: Secondary | ICD-10-CM

## 2016-01-07 NOTE — Assessment & Plan Note (Signed)
Low carb diet and exercise.  Follow met b and a1c.   

## 2016-01-07 NOTE — Assessment & Plan Note (Signed)
Low cholesterol diet and exercise.  Follow lipid panel.  Currently taking lipitor.  Discussed stopping the medication until GI w/up complete.

## 2016-01-07 NOTE — Assessment & Plan Note (Signed)
Blood pressure under good control.  Continue same medication regimen.  Follow pressures.  Follow metabolic panel.   

## 2016-01-07 NOTE — Assessment & Plan Note (Signed)
Diet and exercise.   

## 2016-01-07 NOTE — Assessment & Plan Note (Signed)
W/up in progress.  Concern over primary biliary cirrhosis.  Planning for liver biopsy.

## 2016-01-14 ENCOUNTER — Other Ambulatory Visit: Payer: Self-pay | Admitting: General Surgery

## 2016-01-15 ENCOUNTER — Ambulatory Visit
Admission: RE | Admit: 2016-01-15 | Discharge: 2016-01-15 | Disposition: A | Discharge status: Home / Self Care | Payer: BC Managed Care – PPO | Source: Ambulatory Visit | Attending: Gastroenterology | Admitting: Gastroenterology

## 2016-01-15 DIAGNOSIS — R945 Abnormal results of liver function studies: Secondary | ICD-10-CM

## 2016-01-15 DIAGNOSIS — R7989 Other specified abnormal findings of blood chemistry: Secondary | ICD-10-CM | POA: Diagnosis present

## 2016-01-15 DIAGNOSIS — K7581 Nonalcoholic steatohepatitis (NASH): Secondary | ICD-10-CM | POA: Diagnosis not present

## 2016-01-15 MED ORDER — SODIUM CHLORIDE 0.9 % IV SOLN
INTRAVENOUS | Status: DC
  Administered 2016-01-15: 09:00:00 via INTRAVENOUS

## 2016-01-15 MED ORDER — MIDAZOLAM HCL 5 MG/5ML IJ SOLN
INTRAMUSCULAR | Status: AC | PRN
  Administered 2016-01-15: 1 mg via INTRAVENOUS

## 2016-01-15 MED ORDER — FENTANYL CITRATE (PF) 100 MCG/2ML IJ SOLN
INTRAMUSCULAR | Status: AC | PRN
  Administered 2016-01-15: 50 ug via INTRAVENOUS
  Administered 2016-01-15: 25 ug via INTRAVENOUS

## 2016-01-15 MED ORDER — HYDROCODONE-ACETAMINOPHEN 5-325 MG PO TABS
1.0000 | ORAL_TABLET | ORAL | Status: DC | PRN
  Filled 2016-01-15: qty 2

## 2016-01-15 NOTE — Procedures (Signed)
Procedure and risks discussed with patient. Informed consent obtained. Will perform US-guided liver biopsy.

## 2016-01-15 NOTE — Procedures (Signed)
Under US guidance, core biopsy of right hepatic lobe was performed. No immediate complications.

## 2016-02-06 LAB — SURGICAL PATHOLOGY

## 2016-02-11 ENCOUNTER — Telehealth: Payer: Self-pay | Admitting: Internal Medicine

## 2016-02-11 NOTE — Telephone Encounter (Signed)
Sent pt a my chart message for update.

## 2016-03-13 ENCOUNTER — Encounter: Payer: Self-pay | Admitting: Internal Medicine

## 2016-03-13 ENCOUNTER — Ambulatory Visit (INDEPENDENT_AMBULATORY_CARE_PROVIDER_SITE_OTHER): Payer: BC Managed Care – PPO | Admitting: Internal Medicine

## 2016-03-13 VITALS — BP 110/70 | HR 74 | Temp 98.5°F | Resp 18 | Ht 62.0 in | Wt 234.5 lb

## 2016-03-13 DIAGNOSIS — K7689 Other specified diseases of liver: Secondary | ICD-10-CM | POA: Diagnosis not present

## 2016-03-13 DIAGNOSIS — E7800 Pure hypercholesterolemia, unspecified: Secondary | ICD-10-CM | POA: Diagnosis not present

## 2016-03-13 DIAGNOSIS — R739 Hyperglycemia, unspecified: Secondary | ICD-10-CM | POA: Diagnosis not present

## 2016-03-13 DIAGNOSIS — E669 Obesity, unspecified: Secondary | ICD-10-CM

## 2016-03-13 DIAGNOSIS — I10 Essential (primary) hypertension: Secondary | ICD-10-CM

## 2016-03-13 DIAGNOSIS — R945 Abnormal results of liver function studies: Secondary | ICD-10-CM

## 2016-03-13 NOTE — Progress Notes (Signed)
[name], female   DOB: 1961-06-25, 55 y.o.   MRN: [id number]   Subjective:    [name], female    DOB: 1961/08/26, 55 y.o.   MRN: [id number]  HPI  Patient here for a scheduled follow up.  She has been evaluated for abnormal liver function tests.  S/p liver biopsy.  NASH.  Discussed diet and exercise.  She prefers to be referred to the medical center for further evaluation and treatment.  She has started adjusting her diet.  No chest pain.  No sob.  No acid reflux.  No abdominal pain or cramping.  Bowels stable.  Handling stress.     Past Medical History  Diagnosis Date  . Abnormal liver function   . Allergy   . Heart murmur   . Hypertension   . Hypercholesterolemia   . GERD (gastroesophageal reflux disease)   . Hyperglycemia   . Anemia    Past Surgical History  Procedure Laterality Date  . Breast surgery  2008  . Tonsillectomy and adenoidectomy  1969  . Abdominal hysterectomy  2010  . Rotator cuff repair  2006  . Septoplasty  1990  . Lumbar microdiscectomy  2004  . Achilles tendon repair  2009  . Breast biopsy Left 07/30/2006    benign  . Breast biopsy Left 02/18/2007 & 03/05/2007    benign   Family History  Problem Relation Age of Onset  . Hyperlipidemia Mother   . Hypertension Mother   . Stroke Mother   . Heart disease Mother   . Hypothyroidism Mother   . Hyperlipidemia Maternal Grandmother   . Ulcers Maternal Grandmother   . Ovarian cancer Maternal Aunt   . Breast cancer Paternal Aunt   . Diabetes    . Lung cancer Father     Hx smoker  . Hypertension Maternal Grandfather     MI  . Kidney cancer Paternal Grandmother   . Hypothyroidism      aunt x 2  . Pernicious anemia Mother     aunt x 2  . Heart disease Maternal Grandfather     myocardial infarction  . Colon cancer Neg Hx    Social History   Social History  . Marital Status: Single    Spouse Name: N/A  . Number of Children: 0  . Years of Education: N/A   Social  History Main Topics  . Smoking status: Never Smoker   . Smokeless tobacco: Never Used  . Alcohol Use: 0.0 oz/week    0 Standard drinks or equivalent per week     Comment: occasional  . Drug Use: No  . Sexual Activity: Not Asked   Other Topics Concern  . None   Social History Narrative   She is single and has no children. She works at [employer] as an Web designer.     Outpatient Encounter Prescriptions as of 03/13/2016  Medication Sig  . atorvastatin (LIPITOR) 10 MG tablet Take 1 tablet (10 mg total) by mouth daily.  . clobetasol ointment (TEMOVATE) 8.46 % Apply 1 application topically 2 (two) times daily.  . fluticasone (FLONASE) 50 MCG/ACT nasal spray Place 2 sprays into the nose as needed for rhinitis.  Marland Kitchen lisinopril (PRINIVIL,ZESTRIL) 10 MG tablet Take 1 tablet (10 mg total) by mouth daily.  Marland Kitchen loratadine (CLARITIN) 10 MG tablet Take 10 mg by mouth daily.  . vitamin E 400 UNIT capsule Take 400 Units by mouth daily.  . [DISCONTINUED] omeprazole (PRILOSEC)  20 MG capsule Take 20 mg by mouth daily.   No facility-administered encounter medications on file as of 03/13/2016.    Review of Systems  Constitutional: Negative for appetite change and unexpected weight change.  HENT: Negative for congestion and sinus pressure.   Respiratory: Negative for cough, chest tightness and shortness of breath.   Cardiovascular: Negative for chest pain, palpitations and leg swelling.  Gastrointestinal: Negative for nausea, vomiting, abdominal pain and diarrhea.  Genitourinary: Negative for dysuria and difficulty urinating.  Musculoskeletal: Negative for back pain and joint swelling.  Skin: Negative for color change and rash.  Neurological: Negative for dizziness, light-headedness and headaches.  Psychiatric/Behavioral: Negative for dysphoric mood and agitation.       Objective:    Physical Exam  Constitutional: She appears well-developed and well-nourished. No distress.    HENT:  Nose: Nose normal.  Mouth/Throat: Oropharynx is clear and moist.  Neck: Neck supple. No thyromegaly present.  Cardiovascular: Normal rate and regular rhythm.   Pulmonary/Chest: Breath sounds normal. No respiratory distress. She has no wheezes.  Abdominal: Soft. Bowel sounds are normal. There is no tenderness.  Musculoskeletal: She exhibits no edema or tenderness.  Lymphadenopathy:    She has no cervical adenopathy.  Skin: No rash noted. No erythema.  Psychiatric: She has a normal mood and affect. Her behavior is normal.    BP 110/70 mmHg  Pulse 74  Temp(Src) 98.5 F (36.9 C) (Oral)  Resp 18  Ht 5' 2"  (1.575 m)  Wt 234 lb 8 oz (106.369 kg)  BMI 42.88 kg/m2  SpO2 97% Wt Readings from Last 3 Encounters:  03/13/16 234 lb 8 oz (106.369 kg)  12/31/15 237 lb 6 oz (107.673 kg)  10/19/15 239 lb (108.41 kg)     Lab Results  Component Value Date   WBC 4.4 12/31/2015   HGB 14.6 12/31/2015   HCT 42.8 12/31/2015   PLT 227.0 12/31/2015   GLUCOSE 101* 12/31/2015   CHOL 192 10/19/2015   TRIG 147.0 10/19/2015   HDL 39.10 10/19/2015   LDLDIRECT 171.8 08/25/2013   LDLCALC 123* 10/19/2015   ALT 128* 11/07/2015   AST 126* 11/07/2015   NA 140 12/31/2015   K 4.5 12/31/2015   CL 101 12/31/2015   CREATININE 0.76 12/31/2015   BUN 13 12/31/2015   CO2 27 12/31/2015   TSH 1.91 12/31/2015   HGBA1C 6.8* 10/19/2015   MICROALBUR 0.7 01/31/2015    US Biopsy  01/15/2016  INDICATION: Elevated liver function tests. EXAM: ULTRASOUND BIOPSY CORE LIVER MEDICATIONS: None. ANESTHESIA/SEDATION: Moderate (conscious) sedation was employed during this procedure. A total of Versed 1.0 mg and Fentanyl 75 mcg was administered intravenously. Moderate Sedation Time: 10 minutes. The patient's level of consciousness and vital signs were monitored continuously by radiology nursing throughout the procedure under my direct supervision. COMPLICATIONS: None immediate. PROCEDURE: Informed written consent was  obtained from the patient after a thorough discussion of the procedural risks, benefits and alternatives, including pain. All questions were addressed. Sterile Barrier Technique was utilized including mask, sterile gloves, sterile drape, hand hygiene and skin antiseptic. A timeout was performed prior to the initiation of the procedure. Ultrasound evaluation was performed of right mid-axillary line and mark was placed indicating approach into right hepatic lobe. This was prepped and draped using sterile technique. Local anesthetic was applied. Under ultrasound guidance, 17 gauge guiding needle was directed into right hepatic lobe. Five core samples were obtained using 18 gauge biopsy needle. The samples were placed in formalin vial and delivered  to pathology. Needle was removed with the simultaneous injection of Gel-Foam slurry. Appropriate dressing was applied. IMPRESSION: Under ultrasound guidance, percutaneous core biopsy of right hepatic lobe was performed for evaluation of elevated liver function tests. Electronically Signed   By: Marijo Conception, M.D.   On: 01/15/2016 10:24       Assessment & Plan:   Problem List Items Addressed This Visit    Abnormal liver function    S/p liver biopsy.  NASH.  Follow liver panel.  Discussed diet and exercise.  Request referral to hepatologist.        Relevant Orders   Amb Referral to Hepatology   Hepatic function panel   Hypercholesteremia    On lipitor.  Low cholesterol diet and exercise.  Follow lipid panel and liver function tests.  Discussed with GI.  [place] with continuing cholesterol medication.        Relevant Orders   Amb Referral to Hepatology   Lipid panel   Hyperglycemia    Low carb diet and exercise.  Follow met b and a1c.        Relevant Orders   Hemoglobin A1c   Hypertension - Primary    Blood pressure under good control.  Continue same medication regimen.  Follow pressures.  Follow metabolic panel.        Relevant Orders   Basic  metabolic panel   Obesity (BMI 30-39.9)    Diet and exercise.            Einar Pheasant, MD

## 2016-03-13 NOTE — Progress Notes (Signed)
Pre-visit discussion using our clinic review tool. No additional management support is needed unless otherwise documented below in the visit note.  

## 2016-03-15 ENCOUNTER — Encounter: Payer: Self-pay | Admitting: Internal Medicine

## 2016-03-15 NOTE — Assessment & Plan Note (Signed)
S/p liver biopsy.  NASH.  Follow liver panel.  Discussed diet and exercise.  Request referral to hepatologist.

## 2016-03-15 NOTE — Assessment & Plan Note (Signed)
Blood pressure under good control.  Continue same medication regimen.  Follow pressures.  Follow metabolic panel.   

## 2016-03-15 NOTE — Assessment & Plan Note (Signed)
Low carb diet and exercise.  Follow met b and a1c.   

## 2016-03-15 NOTE — Assessment & Plan Note (Signed)
Diet and exercise.   

## 2016-03-15 NOTE — Assessment & Plan Note (Signed)
On lipitor.  Low cholesterol diet and exercise.  Follow lipid panel and liver function tests.  Discussed with GI.  [place] with continuing cholesterol medication.

## 2016-03-21 ENCOUNTER — Other Ambulatory Visit (INDEPENDENT_AMBULATORY_CARE_PROVIDER_SITE_OTHER): Payer: BC Managed Care – PPO

## 2016-03-21 DIAGNOSIS — R739 Hyperglycemia, unspecified: Secondary | ICD-10-CM | POA: Diagnosis not present

## 2016-03-21 DIAGNOSIS — E7800 Pure hypercholesterolemia, unspecified: Secondary | ICD-10-CM | POA: Diagnosis not present

## 2016-03-21 DIAGNOSIS — R945 Abnormal results of liver function studies: Secondary | ICD-10-CM

## 2016-03-21 DIAGNOSIS — I10 Essential (primary) hypertension: Secondary | ICD-10-CM

## 2016-03-21 DIAGNOSIS — K7689 Other specified diseases of liver: Secondary | ICD-10-CM | POA: Diagnosis not present

## 2016-03-21 LAB — HEPATIC FUNCTION PANEL
ALBUMIN: 4.3 g/dL (ref 3.5–5.2)
ALK PHOS: 78 U/L (ref 39–117)
ALT: 162 U/L — AB (ref 0–35)
AST: 148 U/L — AB (ref 0–37)
BILIRUBIN TOTAL: 0.6 mg/dL (ref 0.2–1.2)
Bilirubin, Direct: 0.1 mg/dL (ref 0.0–0.3)
Total Protein: 7.3 g/dL (ref 6.0–8.3)

## 2016-03-21 LAB — BASIC METABOLIC PANEL
BUN: 16 mg/dL (ref 6–23)
CALCIUM: 9.7 mg/dL (ref 8.4–10.5)
CO2: 31 meq/L (ref 19–32)
Chloride: 101 mEq/L (ref 96–112)
Creatinine, Ser: 0.83 mg/dL (ref 0.40–1.20)
GFR: 75.76 mL/min (ref >60.00)
Glucose, Bld: 139 mg/dL — ABNORMAL HIGH (ref 70–99)
Potassium: 4.4 mEq/L (ref 3.5–5.1)
SODIUM: 138 meq/L (ref 135–145)

## 2016-03-21 LAB — LIPID PANEL
CHOLESTEROL: 200 mg/dL (ref 0–200)
HDL: 41.5 mg/dL (ref >39.00)
LDL CALC: 128 mg/dL — AB (ref 0–99)
NONHDL: 158.2
Total CHOL/HDL Ratio: 5
Triglycerides: 149 mg/dL (ref 0.0–149.0)
VLDL: 29.8 mg/dL (ref 0.0–40.0)

## 2016-03-21 LAB — HEMOGLOBIN A1C: HEMOGLOBIN A1C: 6.4 % (ref 4.6–6.5)

## 2016-03-23 ENCOUNTER — Other Ambulatory Visit: Payer: Self-pay | Admitting: Internal Medicine

## 2016-03-23 DIAGNOSIS — R7989 Other specified abnormal findings of blood chemistry: Secondary | ICD-10-CM

## 2016-03-23 DIAGNOSIS — R945 Abnormal results of liver function studies: Secondary | ICD-10-CM

## 2016-03-23 NOTE — Progress Notes (Signed)
Order placed for f/u labs.  

## 2016-03-24 ENCOUNTER — Encounter: Payer: Self-pay | Admitting: Internal Medicine

## 2016-03-26 NOTE — Telephone Encounter (Signed)
Unread mychart message mailed to patient 

## 2016-04-07 ENCOUNTER — Encounter: Payer: Self-pay | Admitting: Internal Medicine

## 2016-04-07 DIAGNOSIS — R945 Abnormal results of liver function studies: Secondary | ICD-10-CM

## 2016-04-07 DIAGNOSIS — R7989 Other specified abnormal findings of blood chemistry: Secondary | ICD-10-CM

## 2016-04-07 DIAGNOSIS — K7581 Nonalcoholic steatohepatitis (NASH): Secondary | ICD-10-CM

## 2016-04-08 NOTE — Telephone Encounter (Signed)
See Carly Fields's my chart message.  Do I need to place another order for a referral.  Just let me know.  Thanks.

## 2016-04-09 NOTE — Telephone Encounter (Signed)
Order placed for a new referral.  Let me know if I need to do anything else.  The other referral has the names.  I think the information was faxed to [facility].

## 2016-04-09 NOTE — Telephone Encounter (Signed)
Dr. 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

## 2016-04-16 ENCOUNTER — Other Ambulatory Visit (INDEPENDENT_AMBULATORY_CARE_PROVIDER_SITE_OTHER): Payer: BC Managed Care – PPO

## 2016-04-16 DIAGNOSIS — R7989 Other specified abnormal findings of blood chemistry: Secondary | ICD-10-CM | POA: Diagnosis not present

## 2016-04-16 DIAGNOSIS — R945 Abnormal results of liver function studies: Secondary | ICD-10-CM

## 2016-04-17 ENCOUNTER — Encounter: Payer: Self-pay | Admitting: Internal Medicine

## 2016-04-17 LAB — HEPATIC FUNCTION PANEL
ALBUMIN: 4.6 g/dL (ref 3.5–5.2)
ALK PHOS: 75 U/L (ref 39–117)
ALT: 94 U/L — ABNORMAL HIGH (ref 0–35)
AST: 66 U/L — ABNORMAL HIGH (ref 0–37)
Bilirubin, Direct: 0.1 mg/dL (ref 0.0–0.3)
TOTAL PROTEIN: 7.8 g/dL (ref 6.0–8.3)
Total Bilirubin: 0.5 mg/dL (ref 0.2–1.2)

## 2016-04-23 ENCOUNTER — Encounter: Payer: Self-pay | Admitting: Internal Medicine

## 2016-05-16 ENCOUNTER — Encounter: Payer: Self-pay | Admitting: Internal Medicine

## 2016-05-16 ENCOUNTER — Ambulatory Visit (INDEPENDENT_AMBULATORY_CARE_PROVIDER_SITE_OTHER): Payer: BC Managed Care – PPO | Admitting: Internal Medicine

## 2016-05-16 VITALS — BP 100/80 | HR 86 | Temp 98.6°F | Resp 18 | Ht 62.0 in | Wt 235.1 lb

## 2016-05-16 DIAGNOSIS — R739 Hyperglycemia, unspecified: Secondary | ICD-10-CM

## 2016-05-16 DIAGNOSIS — Z0000 Encounter for general adult medical examination without abnormal findings: Secondary | ICD-10-CM | POA: Diagnosis not present

## 2016-05-16 DIAGNOSIS — I10 Essential (primary) hypertension: Secondary | ICD-10-CM

## 2016-05-16 DIAGNOSIS — E669 Obesity, unspecified: Secondary | ICD-10-CM

## 2016-05-16 DIAGNOSIS — E7800 Pure hypercholesterolemia, unspecified: Secondary | ICD-10-CM

## 2016-05-16 DIAGNOSIS — N6489 Other specified disorders of breast: Secondary | ICD-10-CM

## 2016-05-16 DIAGNOSIS — R945 Abnormal results of liver function studies: Secondary | ICD-10-CM

## 2016-05-16 DIAGNOSIS — K7689 Other specified diseases of liver: Secondary | ICD-10-CM
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
## 2016-05-16 NOTE — Progress Notes (Signed)
Pre-visit discussion using our clinic review tool. No additional management support is needed unless otherwise documented below in the visit note.  

## 2016-05-17 ENCOUNTER — Encounter: Payer: Self-pay | Admitting: Internal Medicine

## 2016-05-17 NOTE — Assessment & Plan Note (Signed)
Physical today 05/16/16.  PAP 05/10/15 negative with negative HPV.  Previous mammogram 05/30/15 - Birads II.  Schedule f/u diagnostic mammogram as outlined.

## 2016-05-17 NOTE — Assessment & Plan Note (Signed)
Low cholesterol diet and exercise.  On lipitor.  Taking qod now.  Follow lipid panel and liver function tests.

## 2016-05-17 NOTE — Assessment & Plan Note (Signed)
Diet and exercise.  Follow. She has adjusted her diet.  Discussed exercise.

## 2016-05-17 NOTE — Assessment & Plan Note (Signed)
Low carb diet and exercise.  Follow met b and a1c.  

## 2016-05-17 NOTE — Assessment & Plan Note (Signed)
S/p liver biopsy.  NASH.  Follow liver panel.  Last check improved.  Has appt with hepatologist 08/2016.

## 2016-05-17 NOTE — Assessment & Plan Note (Signed)
Blood pressure under good control.  Continue same medication regimen.  Follow pressures.  Follow metabolic panel.   
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
## 2016-06-02 ENCOUNTER — Ambulatory Visit: Payer: BC Managed Care – PPO

## 2016-06-12 ENCOUNTER — Ambulatory Visit: Payer: BC Managed Care – PPO

## 2016-06-12 ENCOUNTER — Other Ambulatory Visit: Payer: BC Managed Care – PPO

## 2016-06-16 ENCOUNTER — Other Ambulatory Visit: Payer: Self-pay | Admitting: Internal Medicine

## 2016-06-23 ENCOUNTER — Encounter: Payer: Self-pay | Admitting: Internal Medicine

## 2016-06-23 ENCOUNTER — Ambulatory Visit
Admission: RE | Admit: 2016-06-23 | Discharge: 2016-06-23 | Disposition: A | Discharge status: Home / Self Care | Payer: BC Managed Care – PPO | Source: Ambulatory Visit | Attending: Internal Medicine | Admitting: Internal Medicine

## 2016-06-23 ENCOUNTER — Other Ambulatory Visit: Payer: Self-pay | Admitting: Internal Medicine

## 2016-06-23 DIAGNOSIS — N6489 Other specified disorders of breast: Secondary | ICD-10-CM | POA: Diagnosis present

## 2016-07-07 ENCOUNTER — Encounter: Payer: Self-pay | Admitting: Internal Medicine

## 2016-08-14 ENCOUNTER — Other Ambulatory Visit (INDEPENDENT_AMBULATORY_CARE_PROVIDER_SITE_OTHER): Payer: BC Managed Care – PPO

## 2016-08-14 DIAGNOSIS — E7800 Pure hypercholesterolemia, unspecified: Secondary | ICD-10-CM

## 2016-08-14 DIAGNOSIS — R739 Hyperglycemia, unspecified: Secondary | ICD-10-CM | POA: Diagnosis not present

## 2016-08-14 DIAGNOSIS — K7689 Other specified diseases of liver: Secondary | ICD-10-CM

## 2016-08-14 DIAGNOSIS — R945 Abnormal results of liver function studies: Secondary | ICD-10-CM

## 2016-08-14 LAB — LIPID PANEL
CHOLESTEROL: 216 mg/dL — AB (ref 0–200)
HDL: 43.4 mg/dL (ref >39.00)
LDL Cholesterol: 145 mg/dL — ABNORMAL HIGH (ref 0–99)
NonHDL: 172.84
Total CHOL/HDL Ratio: 5
Triglycerides: 140 mg/dL (ref 0.0–149.0)
VLDL: 28 mg/dL (ref 0.0–40.0)

## 2016-08-14 LAB — HEPATIC FUNCTION PANEL
ALBUMIN: 4.5 g/dL (ref 3.5–5.2)
ALT: 59 U/L — AB (ref 0–35)
AST: 40 U/L — AB (ref 0–37)
Alkaline Phosphatase: 74 U/L (ref 39–117)
Bilirubin, Direct: 0.1 mg/dL (ref 0.0–0.3)
TOTAL PROTEIN: 7.2 g/dL (ref 6.0–8.3)
Total Bilirubin: 0.5 mg/dL (ref 0.2–1.2)

## 2016-08-14 LAB — BASIC METABOLIC PANEL
BUN: 19 mg/dL (ref 6–23)
CHLORIDE: 103 meq/L (ref 96–112)
CO2: 29 meq/L (ref 19–32)
Calcium: 9.8 mg/dL (ref 8.4–10.5)
Creatinine, Ser: 0.85 mg/dL (ref 0.40–1.20)
GFR: 73.6 mL/min (ref >60.00)
Glucose, Bld: 131 mg/dL — ABNORMAL HIGH (ref 70–99)
POTASSIUM: 5 meq/L (ref 3.5–5.1)
SODIUM: 142 meq/L (ref 135–145)

## 2016-08-14 LAB — HEMOGLOBIN A1C: HEMOGLOBIN A1C: 6.2 % (ref 4.6–6.5)

## 2016-08-15 ENCOUNTER — Encounter: Payer: Self-pay | Admitting: Internal Medicine

## 2016-08-18 ENCOUNTER — Encounter: Payer: Self-pay | Admitting: Internal Medicine

## 2016-08-18 ENCOUNTER — Ambulatory Visit (INDEPENDENT_AMBULATORY_CARE_PROVIDER_SITE_OTHER): Payer: BC Managed Care – PPO | Admitting: Internal Medicine

## 2016-08-18 DIAGNOSIS — E669 Obesity, unspecified: Secondary | ICD-10-CM | POA: Diagnosis not present

## 2016-08-18 DIAGNOSIS — I10 Essential (primary) hypertension: Secondary | ICD-10-CM | POA: Diagnosis not present

## 2016-08-18 DIAGNOSIS — E7800 Pure hypercholesterolemia, unspecified: Secondary | ICD-10-CM

## 2016-08-18 DIAGNOSIS — R739 Hyperglycemia, unspecified: Secondary | ICD-10-CM | POA: Diagnosis not present

## 2016-08-18 DIAGNOSIS — K7689 Other specified diseases of liver: Secondary | ICD-10-CM

## 2016-08-18 DIAGNOSIS — R945 Abnormal results of liver function studies: Secondary | ICD-10-CM
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
## 2016-08-18 NOTE — Progress Notes (Signed)
Pre visit review using our clinic review tool, if applicable. No additional management support is needed unless otherwise documented below in the visit note. 

## 2016-08-24 ENCOUNTER — Encounter: Payer: Self-pay | Admitting: Internal Medicine

## 2016-08-24 NOTE — Assessment & Plan Note (Signed)
Diet and exercise.  Follow.  

## 2016-08-24 NOTE — Assessment & Plan Note (Signed)
Blood pressure under good control.  Continue same medication regimen.  Follow pressures.  Follow metabolic panel.   

## 2016-08-24 NOTE — Assessment & Plan Note (Signed)
Low carb diet and exercise.  Follow met b and a1c.  Recent a1c 6.2.  

## 2016-08-24 NOTE — Assessment & Plan Note (Addendum)
S/p liver biopsy.  NASH.  Recent liver panel improved.  Planning to see hepatologist next week.  Need hepatitis vaccine.

## 2016-08-24 NOTE — Assessment & Plan Note (Signed)
On lipitor.  Low cholesterol diet and exercise.  Follow lipid panel and liver function tests.  Recent LDL 145.

## 2016-09-10 ENCOUNTER — Ambulatory Visit (INDEPENDENT_AMBULATORY_CARE_PROVIDER_SITE_OTHER): Payer: BC Managed Care – PPO

## 2016-09-10 DIAGNOSIS — R945 Abnormal results of liver function studies: Secondary | ICD-10-CM

## 2016-09-10 DIAGNOSIS — Z23 Encounter for immunization: Secondary | ICD-10-CM | POA: Diagnosis not present

## 2016-09-10 NOTE — Progress Notes (Signed)
Patient comes in for Twinrix injection.  Injection given in right deltoid. She was instructed to return in 1 month for 2 nd injection. Patient tolerated injection well.

## 2016-09-16 ENCOUNTER — Encounter: Payer: Self-pay | Admitting: Internal Medicine

## 2016-09-17 ENCOUNTER — Encounter: Payer: Self-pay | Admitting: Internal Medicine

## 2016-09-17 DIAGNOSIS — N281 Cyst of kidney, acquired: Secondary | ICD-10-CM | POA: Insufficient documentation

## 2016-10-14 ENCOUNTER — Ambulatory Visit (INDEPENDENT_AMBULATORY_CARE_PROVIDER_SITE_OTHER): Payer: BC Managed Care – PPO

## 2016-10-14 DIAGNOSIS — Z23 Encounter for immunization: Secondary | ICD-10-CM | POA: Diagnosis not present

## 2016-10-14 DIAGNOSIS — R945 Abnormal results of liver function studies: Secondary | ICD-10-CM

## 2016-10-14 NOTE — Progress Notes (Addendum)
Patient came in for Hep A/B. Injected into the RA Tolerated injection well   Reviewed.  Dr Nicki Reaper

## 2016-12-15 ENCOUNTER — Other Ambulatory Visit (INDEPENDENT_AMBULATORY_CARE_PROVIDER_SITE_OTHER): Payer: BC Managed Care – PPO

## 2016-12-15 DIAGNOSIS — K7689 Other specified diseases of liver: Secondary | ICD-10-CM

## 2016-12-15 DIAGNOSIS — I10 Essential (primary) hypertension: Secondary | ICD-10-CM | POA: Diagnosis not present

## 2016-12-15 DIAGNOSIS — E7800 Pure hypercholesterolemia, unspecified: Secondary | ICD-10-CM | POA: Diagnosis not present

## 2016-12-15 DIAGNOSIS — R739 Hyperglycemia, unspecified: Secondary | ICD-10-CM

## 2016-12-15 DIAGNOSIS — R945 Abnormal results of liver function studies: Secondary | ICD-10-CM

## 2016-12-15 LAB — BASIC METABOLIC PANEL
BUN: 22 mg/dL (ref 6–23)
CALCIUM: 9.8 mg/dL (ref 8.4–10.5)
CO2: 29 meq/L (ref 19–32)
CREATININE: 0.84 mg/dL (ref 0.40–1.20)
Chloride: 103 mEq/L (ref 96–112)
GFR: 74.52 mL/min (ref >60.00)
GLUCOSE: 127 mg/dL — AB (ref 70–99)
Potassium: 4.4 mEq/L (ref 3.5–5.1)
Sodium: 140 mEq/L (ref 135–145)

## 2016-12-15 LAB — HEPATIC FUNCTION PANEL
ALBUMIN: 4.4 g/dL (ref 3.5–5.2)
ALK PHOS: 63 U/L (ref 39–117)
ALT: 37 U/L — ABNORMAL HIGH (ref 0–35)
AST: 26 U/L (ref 0–37)
Bilirubin, Direct: 0.1 mg/dL (ref 0.0–0.3)
Total Bilirubin: 0.4 mg/dL (ref 0.2–1.2)
Total Protein: 7 g/dL (ref 6.0–8.3)

## 2016-12-15 LAB — LIPID PANEL
CHOLESTEROL: 221 mg/dL — AB (ref 0–200)
HDL: 43.2 mg/dL (ref >39.00)
NonHDL: 178.1
TRIGLYCERIDES: 218 mg/dL — AB (ref 0.0–149.0)
Total CHOL/HDL Ratio: 5
VLDL: 43.6 mg/dL — AB (ref 0.0–40.0)

## 2016-12-15 LAB — LDL CHOLESTEROL, DIRECT: Direct LDL: 147 mg/dL

## 2016-12-15 LAB — HEMOGLOBIN A1C: Hgb A1c MFr Bld: 6.3 % (ref 4.6–6.5)

## 2016-12-16 ENCOUNTER — Encounter: Payer: Self-pay | Admitting: Internal Medicine

## 2016-12-18 ENCOUNTER — Encounter: Payer: Self-pay | Admitting: Internal Medicine

## 2016-12-18 ENCOUNTER — Ambulatory Visit (INDEPENDENT_AMBULATORY_CARE_PROVIDER_SITE_OTHER): Payer: BC Managed Care – PPO | Admitting: Internal Medicine

## 2016-12-18 DIAGNOSIS — I10 Essential (primary) hypertension: Secondary | ICD-10-CM | POA: Diagnosis not present

## 2016-12-18 DIAGNOSIS — K7689 Other specified diseases of liver: Secondary | ICD-10-CM

## 2016-12-18 DIAGNOSIS — R739 Hyperglycemia, unspecified: Secondary | ICD-10-CM | POA: Diagnosis not present

## 2016-12-18 DIAGNOSIS — E669 Obesity, unspecified: Secondary | ICD-10-CM

## 2016-12-18 DIAGNOSIS — E7800 Pure hypercholesterolemia, unspecified: Secondary | ICD-10-CM

## 2016-12-18 DIAGNOSIS — R945 Abnormal results of liver function studies: Secondary | ICD-10-CM

## 2016-12-18 MED ORDER — LISINOPRIL 10 MG PO TABS
ORAL_TABLET | ORAL | 1 refills | Status: DC
Start: 2016-12-18 — End: 2017-04-23

## 2016-12-18 MED ORDER — ATORVASTATIN CALCIUM 10 MG PO TABS: 10.0000 mg | ORAL_TABLET | Freq: Every day | ORAL | 0 refills | Status: DC

## 2016-12-18 NOTE — Progress Notes (Signed)
Pre-visit discussion using our clinic review tool. No additional management support is needed unless otherwise documented below in the visit note.  
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
## 2016-12-19 ENCOUNTER — Encounter: Payer: Self-pay | Admitting: Internal Medicine

## 2016-12-20 ENCOUNTER — Other Ambulatory Visit: Payer: Self-pay | Admitting: Internal Medicine

## 2016-12-21 ENCOUNTER — Encounter: Payer: Self-pay | Admitting: Internal Medicine

## 2016-12-21 NOTE — Assessment & Plan Note (Signed)
s/p liver biopsy.  NASH.  Recent liver panel improved.  Being followed by hepatologist at [facility].

## 2016-12-21 NOTE — Assessment & Plan Note (Signed)
Low carb diet and exercise.  Follow met b and a1c.   Lab Results  Component Value Date   HGBA1C 6.3 12/15/2016

## 2016-12-21 NOTE — Assessment & Plan Note (Signed)
On lipitor.  LDL improved.  Hold on increasing.  Diet and exercise.  Follow lipid panel and liver function tests.   Lab Results  Component Value Date   CHOL 221 (H) 12/15/2016   HDL 43.20 12/15/2016   LDLCALC 145 (H) 08/14/2016   LDLDIRECT 147.0 12/15/2016   TRIG 218.0 (H) 12/15/2016   CHOLHDL 5 12/15/2016

## 2016-12-21 NOTE — Assessment & Plan Note (Signed)
Blood pressure under good control.  Continue same medication regimen.  Follow pressures.  Follow metabolic panel.   

## 2016-12-21 NOTE — Assessment & Plan Note (Signed)
Diet and exercise.  Follow.  

## 2017-03-02 IMAGING — RF DG ESOPHAGUS
8 series · 12 of 12 positions shown · non-contrast
Comparison: No recent prior.

CLINICAL DATA: Dysphagia.

EXAM:
ESOPHOGRAM / BARIUM SWALLOW / BARIUM TABLET STUDY
TECHNIQUE: Combined double contrast and single contrast examination performed
using effervescent crystals, thick barium liquid, and thin barium
liquid. The patient was observed with fluoroscopy swallowing a 13 mm
barium sulphate tablet.
FLUOROSCOPY TIME:  Radiation Exposure Index (as provided by the
fluoroscopic device): 21.3 mGy
If the device does not provide the exposure index:

[Series 1: fluoro_barium 2fps_bw · 0.17mm/px · 3 of 5 frames shown (1 of 7)]
[frame 1/5]
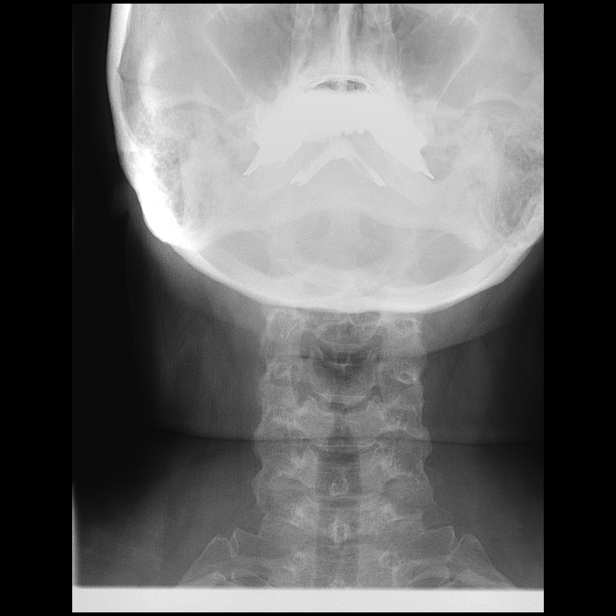
[frame 3/5]
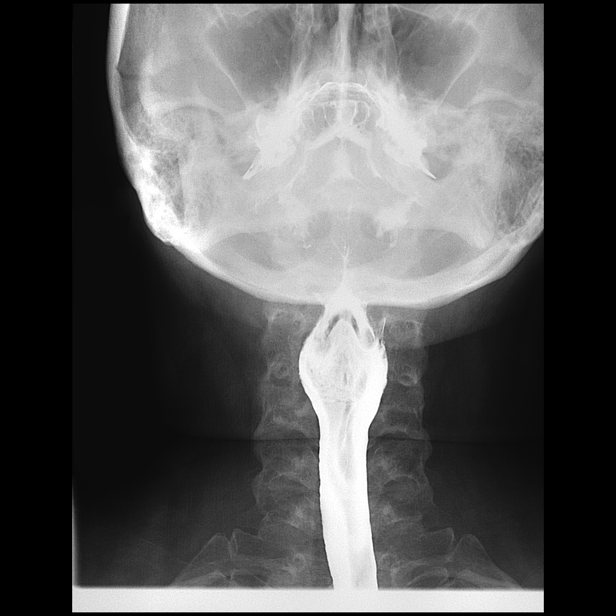
[frame 5/5]
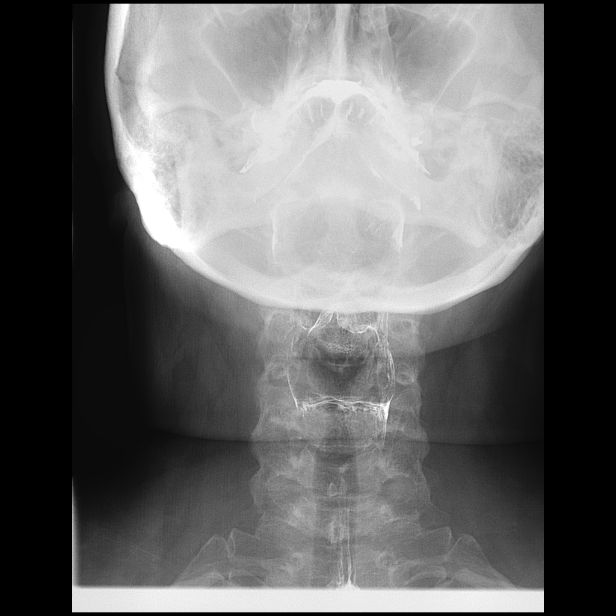

[Series 2: fluoro_barium 2fps_bw · 0.17mm/px · 3 of 4 frames shown (2 of 7)]
[frame 1/4]
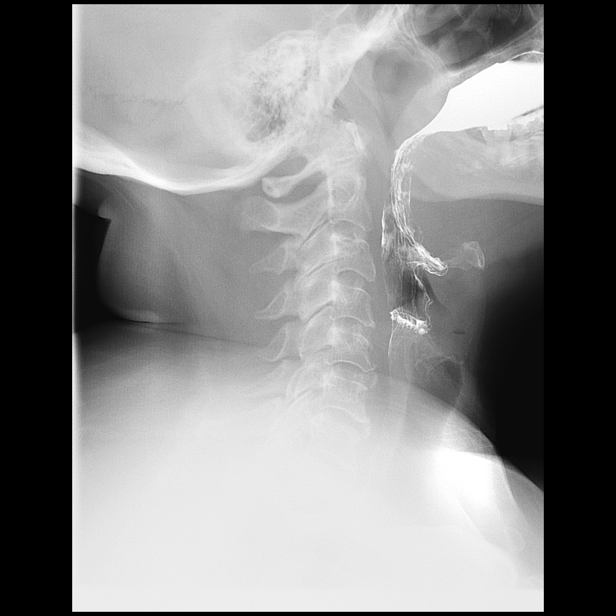
[frame 3/4]
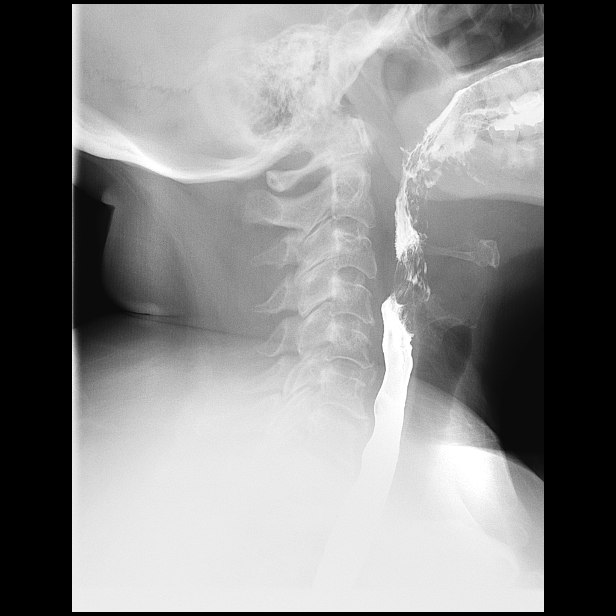
[frame 4/4]
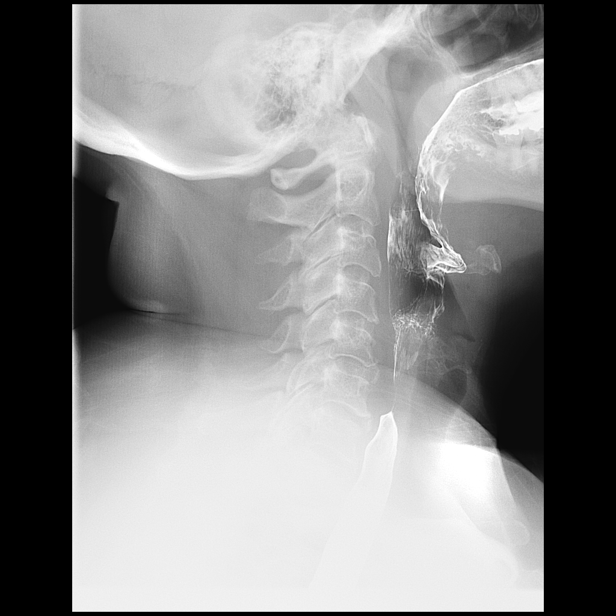

[Series 3: fluoro_barium 2fps_bw · 0.18mm/px · 1 of 1 slices shown (3 of 7)]
[im 1/1]
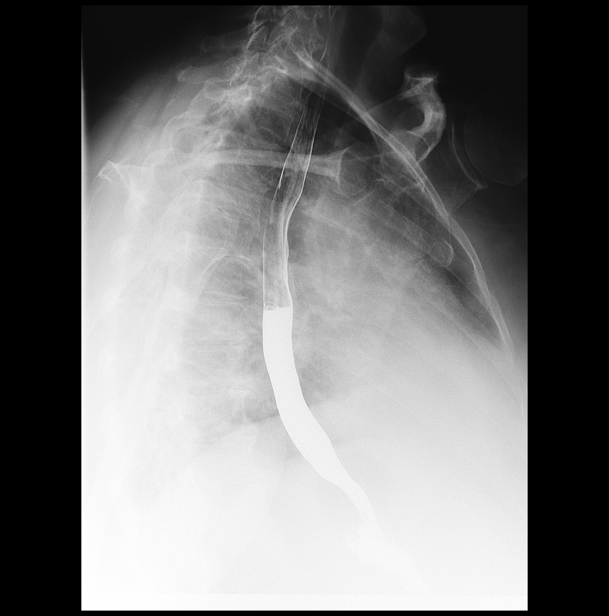

[Series 4: fluoro_barium 2fps_bw · 0.18mm/px · 1 of 1 slices shown (4 of 7)]
[im 1/1]
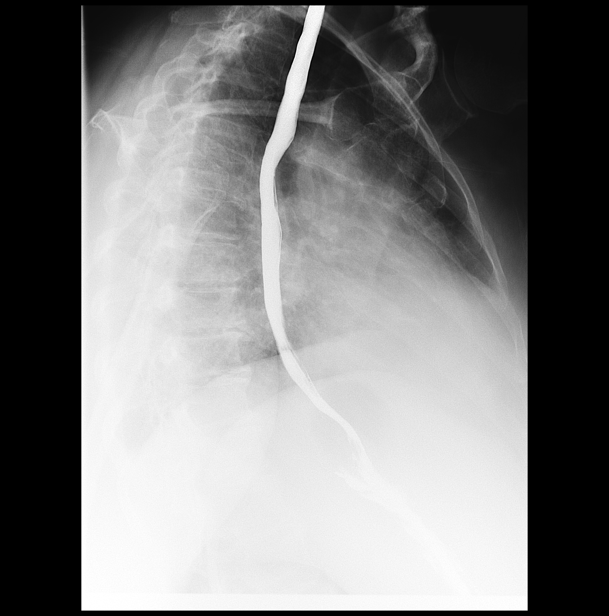

[Series 5: fluoro_barium 2fps_bw · 0.18mm/px · 1 of 1 slices shown (5 of 7)]
[im 1/1]
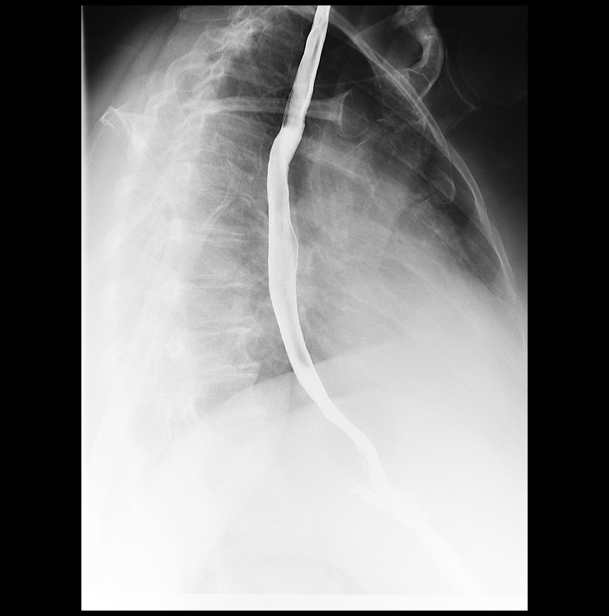

[Series 6: fluoro_barium 2fps_bw · 0.18mm/px · 1 of 1 slices shown (6 of 7)]
[im 1/1]
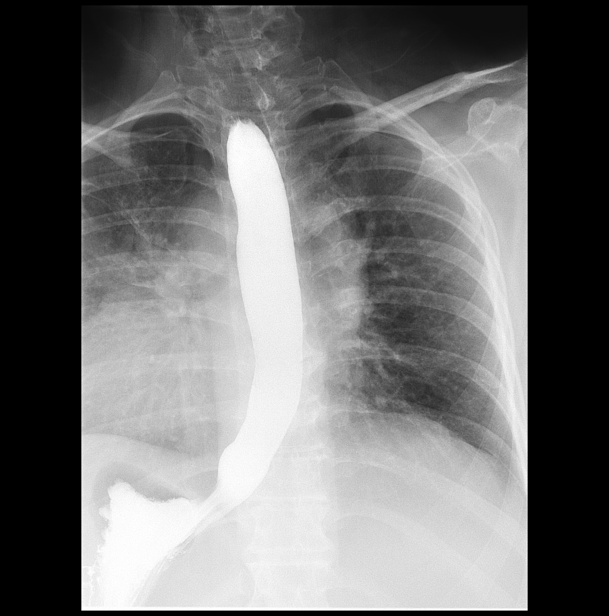

[Series 7: fluoro_barium 2fps_bw · 0.18mm/px · 1 of 1 slices shown (7 of 7)]
[im 1/1]
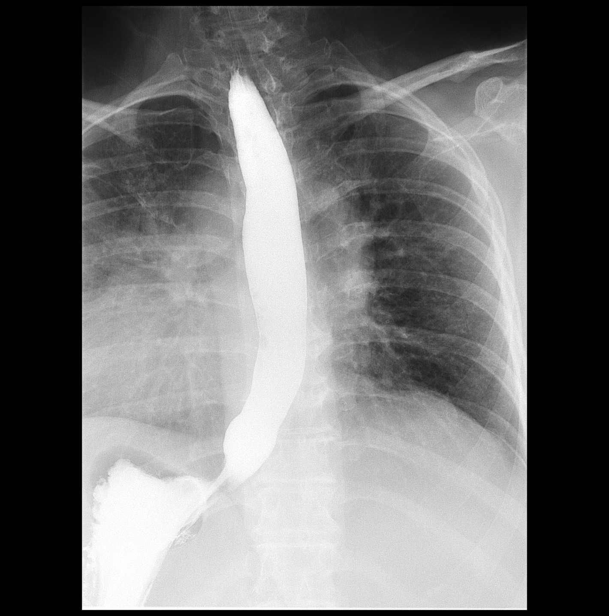

[Series 8: cp_standard · 0.27mm/px · 1 of 1 slices shown]
[im 1/1]
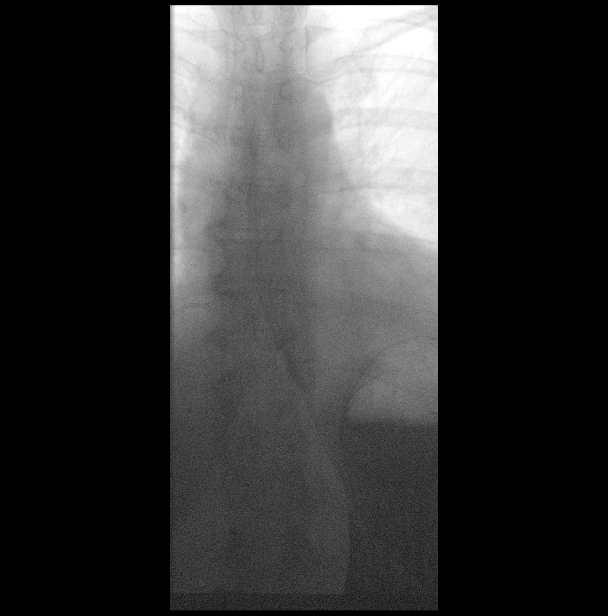

[12 of 12 positions shown; findings below may reference images not displayed]

FINDINGS: Cervical esophagus is normal. Thoracic esophagus is normal. No
evidence of obstructing lesion. No hiatal hernia or reflux.
Peristalsis normal.
IMPRESSION: Normal exam.

## 2017-03-17 ENCOUNTER — Ambulatory Visit (INDEPENDENT_AMBULATORY_CARE_PROVIDER_SITE_OTHER): Payer: BC Managed Care – PPO

## 2017-03-17 DIAGNOSIS — Z23 Encounter for immunization: Secondary | ICD-10-CM | POA: Diagnosis not present

## 2017-03-17 NOTE — Progress Notes (Addendum)
Patient comes in today for Hep A/B injection. Injected into the Left deltoid. Patient tolerated injection well, No complaints of pain afterwards   Reviewed.  Dr Nicki Reaper

## 2017-03-26 LAB — HM DIABETES EYE EXAM

## 2017-04-20 ENCOUNTER — Encounter: Payer: Self-pay | Admitting: Internal Medicine

## 2017-04-20 ENCOUNTER — Other Ambulatory Visit (INDEPENDENT_AMBULATORY_CARE_PROVIDER_SITE_OTHER): Payer: BC Managed Care – PPO

## 2017-04-20 DIAGNOSIS — R945 Abnormal results of liver function studies: Secondary | ICD-10-CM

## 2017-04-20 DIAGNOSIS — R739 Hyperglycemia, unspecified: Secondary | ICD-10-CM | POA: Diagnosis not present

## 2017-04-20 DIAGNOSIS — I10 Essential (primary) hypertension: Secondary | ICD-10-CM | POA: Diagnosis not present

## 2017-04-20 DIAGNOSIS — K7689 Other specified diseases of liver: Secondary | ICD-10-CM | POA: Diagnosis not present

## 2017-04-20 DIAGNOSIS — E7800 Pure hypercholesterolemia, unspecified: Secondary | ICD-10-CM | POA: Diagnosis not present

## 2017-04-20 LAB — LIPID PANEL
CHOL/HDL RATIO: 5
Cholesterol: 204 mg/dL — ABNORMAL HIGH (ref 0–200)
HDL: 44.1 mg/dL (ref >39.00)
LDL Cholesterol: 126 mg/dL — ABNORMAL HIGH (ref 0–99)
NONHDL: 159.99
Triglycerides: 171 mg/dL — ABNORMAL HIGH (ref 0.0–149.0)
VLDL: 34.2 mg/dL (ref 0.0–40.0)

## 2017-04-20 LAB — CBC WITH DIFFERENTIAL/PLATELET
Basophils Absolute: 0 10*3/uL (ref 0.0–0.1)
Basophils Relative: 0.3 % (ref 0.0–3.0)
EOS ABS: 0.1 10*3/uL (ref 0.0–0.7)
Eosinophils Relative: 3.1 % (ref 0.0–5.0)
HCT: 43.1 % (ref 36.0–46.0)
HEMOGLOBIN: 14.2 g/dL (ref 12.0–15.0)
LYMPHS ABS: 1.1 10*3/uL (ref 0.7–4.0)
Lymphocytes Relative: 27.5 % (ref 12.0–46.0)
MCHC: 33 g/dL (ref 30.0–36.0)
MCV: 83.6 fl (ref 78.0–100.0)
MONO ABS: 0.3 10*3/uL (ref 0.1–1.0)
Monocytes Relative: 6.7 % (ref 3.0–12.0)
NEUTROS PCT: 62.4 % (ref 43.0–77.0)
Neutro Abs: 2.5 10*3/uL (ref 1.4–7.7)
Platelets: 200 10*3/uL (ref 150.0–400.0)
RBC: 5.15 Mil/uL — AB (ref 3.87–5.11)
RDW: 13.7 % (ref 11.5–15.5)
WBC: 4 10*3/uL (ref 4.0–10.5)

## 2017-04-20 LAB — BASIC METABOLIC PANEL
BUN: 19 mg/dL (ref 6–23)
CHLORIDE: 100 meq/L (ref 96–112)
CO2: 30 meq/L (ref 19–32)
Calcium: 9.5 mg/dL (ref 8.4–10.5)
Creatinine, Ser: 0.84 mg/dL (ref 0.40–1.20)
GFR: 74.43 mL/min (ref >60.00)
GLUCOSE: 151 mg/dL — AB (ref 70–99)
POTASSIUM: 4.2 meq/L (ref 3.5–5.1)
Sodium: 137 mEq/L (ref 135–145)

## 2017-04-20 LAB — HEPATIC FUNCTION PANEL
ALBUMIN: 4.2 g/dL (ref 3.5–5.2)
ALK PHOS: 66 U/L (ref 39–117)
ALT: 58 U/L — ABNORMAL HIGH (ref 0–35)
AST: 36 U/L (ref 0–37)
BILIRUBIN DIRECT: 0.1 mg/dL (ref 0.0–0.3)
BILIRUBIN TOTAL: 0.4 mg/dL (ref 0.2–1.2)
Total Protein: 7.3 g/dL (ref 6.0–8.3)

## 2017-04-20 LAB — HEMOGLOBIN A1C: Hgb A1c MFr Bld: 6.8 % — ABNORMAL HIGH (ref 4.6–6.5)

## 2017-04-20 LAB — TSH: TSH: 2.72 u[IU]/mL (ref 0.35–4.50)

## 2017-04-23 ENCOUNTER — Ambulatory Visit (INDEPENDENT_AMBULATORY_CARE_PROVIDER_SITE_OTHER): Payer: BC Managed Care – PPO | Admitting: Internal Medicine

## 2017-04-23 ENCOUNTER — Encounter: Payer: Self-pay | Admitting: Internal Medicine

## 2017-04-23 DIAGNOSIS — I10 Essential (primary) hypertension: Secondary | ICD-10-CM | POA: Diagnosis not present

## 2017-04-23 DIAGNOSIS — R945 Abnormal results of liver function studies: Secondary | ICD-10-CM

## 2017-04-23 DIAGNOSIS — E7800 Pure hypercholesterolemia, unspecified: Secondary | ICD-10-CM | POA: Diagnosis not present

## 2017-04-23 DIAGNOSIS — E119 Type 2 diabetes mellitus without complications: Secondary | ICD-10-CM

## 2017-04-23 DIAGNOSIS — K7689 Other specified diseases of liver: Secondary | ICD-10-CM

## 2017-04-23 DIAGNOSIS — Z6841 Body Mass Index (BMI) 40.0 and over, adult: Secondary | ICD-10-CM

## 2017-04-23 MED ORDER — LISINOPRIL 10 MG PO TABS: ORAL_TABLET | ORAL | 1 refills | Status: DC

## 2017-04-23 MED ORDER — ATORVASTATIN CALCIUM 10 MG PO TABS: 10.0000 mg | ORAL_TABLET | Freq: Every day | ORAL | 1 refills | Status: DC
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
## 2017-04-26 ENCOUNTER — Encounter: Payer: Self-pay | Admitting: Internal Medicine

## 2017-04-26 NOTE — Assessment & Plan Note (Signed)
Discussed diet and exercise.  Follow.  

## 2017-04-26 NOTE — Assessment & Plan Note (Signed)
Discussed recent a1c 6.8.  Discussed low carb diet and exercise and need for weight loss.  Follow met b and a1c.

## 2017-04-26 NOTE — Assessment & Plan Note (Signed)
S/p liver biopsy.  NASH.  Recent check stable.  Discussed diet and exercise.  Discussed weight loss.  Follow.

## 2017-04-26 NOTE — Assessment & Plan Note (Signed)
Blood pressure under good control.  Continue same medication regimen.  Follow pressures.  Follow metabolic panel.   

## 2017-04-26 NOTE — Assessment & Plan Note (Signed)
Low cholesterol diet and exercise.  On lipitor.  Follow lipid panel and liver function tests.   

## 2017-07-08 ENCOUNTER — Encounter: Payer: Self-pay | Admitting: Internal Medicine

## 2017-07-09 ENCOUNTER — Other Ambulatory Visit (INDEPENDENT_AMBULATORY_CARE_PROVIDER_SITE_OTHER): Payer: BC Managed Care – PPO

## 2017-07-09 DIAGNOSIS — I10 Essential (primary) hypertension: Secondary | ICD-10-CM | POA: Diagnosis not present

## 2017-07-09 DIAGNOSIS — R945 Abnormal results of liver function studies: Secondary | ICD-10-CM

## 2017-07-09 DIAGNOSIS — E7800 Pure hypercholesterolemia, unspecified: Secondary | ICD-10-CM | POA: Diagnosis not present

## 2017-07-09 DIAGNOSIS — K7689 Other specified diseases of liver: Secondary | ICD-10-CM | POA: Diagnosis not present

## 2017-07-09 DIAGNOSIS — E119 Type 2 diabetes mellitus without complications: Secondary | ICD-10-CM | POA: Diagnosis not present

## 2017-07-09 LAB — LIPID PANEL
Cholesterol: 215 mg/dL — ABNORMAL HIGH (ref 0–200)
HDL: 44.9 mg/dL (ref >39.00)
LDL Cholesterol: 137 mg/dL — ABNORMAL HIGH (ref 0–99)
NONHDL: 170.08
Total CHOL/HDL Ratio: 5
Triglycerides: 167 mg/dL — ABNORMAL HIGH (ref 0.0–149.0)
VLDL: 33.4 mg/dL (ref 0.0–40.0)

## 2017-07-09 LAB — MICROALBUMIN / CREATININE URINE RATIO
CREATININE, U: 68.5 mg/dL
MICROALB/CREAT RATIO: 1.3 mg/g (ref 0.0–30.0)
Microalb, Ur: 0.9 mg/dL (ref 0.0–1.9)

## 2017-07-09 LAB — BASIC METABOLIC PANEL
BUN: 16 mg/dL (ref 6–23)
CALCIUM: 9.6 mg/dL (ref 8.4–10.5)
CO2: 30 meq/L (ref 19–32)
CREATININE: 0.88 mg/dL (ref 0.40–1.20)
Chloride: 101 mEq/L (ref 96–112)
GFR: 70.48 mL/min (ref >60.00)
GLUCOSE: 122 mg/dL — AB (ref 70–99)
Potassium: 4.3 mEq/L (ref 3.5–5.1)
SODIUM: 137 meq/L (ref 135–145)

## 2017-07-09 LAB — HEPATIC FUNCTION PANEL
ALBUMIN: 4.4 g/dL (ref 3.5–5.2)
ALT: 75 U/L — ABNORMAL HIGH (ref 0–35)
AST: 63 U/L — AB (ref 0–37)
Alkaline Phosphatase: 65 U/L (ref 39–117)
Bilirubin, Direct: 0.1 mg/dL (ref 0.0–0.3)
TOTAL PROTEIN: 7.3 g/dL (ref 6.0–8.3)
Total Bilirubin: 0.5 mg/dL (ref 0.2–1.2)

## 2017-07-09 LAB — HEMOGLOBIN A1C: Hgb A1c MFr Bld: 6.6 % — ABNORMAL HIGH (ref 4.6–6.5)

## 2017-07-09 NOTE — Telephone Encounter (Signed)
Sent her a message to bring in lab request.  See if lab can draw with the outside physician lab request.  If not, will need to order labs here.  I sent her a my chart message, but please call her and notify her that she will need to bring in the lab request (from her outside physician).    Dr Nicki Reaper

## 2017-07-13 ENCOUNTER — Encounter: Payer: Self-pay | Admitting: Internal Medicine

## 2017-07-13 ENCOUNTER — Ambulatory Visit (INDEPENDENT_AMBULATORY_CARE_PROVIDER_SITE_OTHER): Payer: BC Managed Care – PPO | Admitting: Internal Medicine

## 2017-07-13 VITALS — BP 120/68 | HR 77 | Temp 99.0°F | Resp 14 | Ht 62.0 in | Wt 238.4 lb

## 2017-07-13 DIAGNOSIS — N281 Cyst of kidney, acquired: Secondary | ICD-10-CM

## 2017-07-13 DIAGNOSIS — K7581 Nonalcoholic steatohepatitis (NASH): Secondary | ICD-10-CM | POA: Diagnosis not present

## 2017-07-13 DIAGNOSIS — E119 Type 2 diabetes mellitus without complications: Secondary | ICD-10-CM

## 2017-07-13 DIAGNOSIS — Z0000 Encounter for general adult medical examination without abnormal findings: Secondary | ICD-10-CM

## 2017-07-13 DIAGNOSIS — Z6841 Body Mass Index (BMI) 40.0 and over, adult: Secondary | ICD-10-CM

## 2017-07-13 DIAGNOSIS — K7689 Other specified diseases of liver: Secondary | ICD-10-CM

## 2017-07-13 DIAGNOSIS — Z1231 Encounter for screening mammogram for malignant neoplasm of breast: Secondary | ICD-10-CM

## 2017-07-13 DIAGNOSIS — I10 Essential (primary) hypertension: Secondary | ICD-10-CM | POA: Diagnosis not present

## 2017-07-13 DIAGNOSIS — E7800 Pure hypercholesterolemia, unspecified: Secondary | ICD-10-CM | POA: Diagnosis not present

## 2017-07-13 DIAGNOSIS — Z1239 Encounter for other screening for malignant neoplasm of breast: Secondary | ICD-10-CM

## 2017-07-13 DIAGNOSIS — R945 Abnormal results of liver function studies: Secondary | ICD-10-CM

## 2017-07-13 LAB — HM DIABETES FOOT EXAM
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
## 2017-07-14 LAB — TSH: TSH: 3.53 u[IU]/mL (ref 0.35–4.50)

## 2017-07-14 LAB — COMPREHENSIVE METABOLIC PANEL
ALK PHOS: 67 U/L (ref 39–117)
ALT: 73 U/L — AB (ref 0–35)
AST: 50 U/L — AB (ref 0–37)
Albumin: 4.5 g/dL (ref 3.5–5.2)
BILIRUBIN TOTAL: 0.3 mg/dL (ref 0.2–1.2)
BUN: 15 mg/dL (ref 6–23)
CALCIUM: 10.1 mg/dL (ref 8.4–10.5)
CO2: 28 meq/L (ref 19–32)
CREATININE: 0.78 mg/dL (ref 0.40–1.20)
Chloride: 103 mEq/L (ref 96–112)
GFR: 81 mL/min (ref >60.00)
GLUCOSE: 91 mg/dL (ref 70–99)
Potassium: 4.5 mEq/L (ref 3.5–5.1)
Sodium: 141 mEq/L (ref 135–145)
TOTAL PROTEIN: 7.6 g/dL (ref 6.0–8.3)

## 2017-07-14 LAB — CBC
HCT: 44.6 % (ref 36.0–46.0)
HEMOGLOBIN: 14.5 g/dL (ref 12.0–15.0)
MCHC: 32.5 g/dL (ref 30.0–36.0)
MCV: 85.4 fl (ref 78.0–100.0)
Platelets: 225 10*3/uL (ref 150.0–400.0)
RBC: 5.22 Mil/uL — AB (ref 3.87–5.11)
RDW: 13.9 % (ref 11.5–15.5)
WBC: 3.7 10*3/uL — AB (ref 4.0–10.5)

## 2017-07-14 LAB — VITAMIN D 25 HYDROXY (VIT D DEFICIENCY, FRACTURES): VITD: 18.75 ng/mL — ABNORMAL LOW (ref 30.00–100.00)

## 2017-07-14 LAB — AFP TUMOR MARKER: AFP TUMOR MARKER: 2.5 ng/mL

## 2017-07-14 LAB — PROTIME-INR
INR: 1
Prothrombin Time: 10.5 s (ref 9.0–11.5)

## 2017-07-15 ENCOUNTER — Other Ambulatory Visit: Payer: Self-pay | Admitting: Internal Medicine

## 2017-07-15 DIAGNOSIS — D72819 Decreased white blood cell count, unspecified: Secondary | ICD-10-CM

## 2017-07-15 NOTE — Progress Notes (Signed)
Order placed for f/u cbc.

## 2017-07-16 ENCOUNTER — Encounter: Payer: Self-pay | Admitting: Internal Medicine

## 2017-07-16 MED ORDER — ATORVASTATIN CALCIUM 10 MG PO TABS: 10.0000 mg | ORAL_TABLET | Freq: Every day | ORAL | 1 refills | Status: DC

## 2017-07-16 NOTE — Assessment & Plan Note (Signed)
Blood pressure under good control.  Continue same medication regimen.  Follow pressures.  Follow metabolic panel.   

## 2017-07-16 NOTE — Addendum Note (Signed)
Addended by: Alisa Graff on: 07/16/2017 06:06 AM   Modules accepted: Orders

## 2017-07-16 NOTE — Assessment & Plan Note (Signed)
On lipitor.  Low cholesterol diet and exercise.  Follow lipid panel and liver function tests.   

## 2017-07-16 NOTE — Assessment & Plan Note (Signed)
Discussed diet and exercise.  Follow.  

## 2017-07-16 NOTE — Assessment & Plan Note (Signed)
Discussed diet and exercise.  Low carb diet.  Follow met b and a1c.   

## 2017-07-16 NOTE — Assessment & Plan Note (Signed)
Noted right renal cyst - last ultrasound testing radiology read:  Benign.  Scheduled for f/u ultrasound next month.

## 2017-07-16 NOTE — Assessment & Plan Note (Signed)
S/p liver biopsy.  NASH.  Recheck liver panel today.  Will also check labs requested by hepatologist.  Discussed the need for diet, exercise and weight loss.  Also discussed the need to keep sugars under good control.

## 2017-07-16 NOTE — Assessment & Plan Note (Signed)
Physical today 07/13/17.  PAP 05/10/15 - negative with negative  HPV.  Mammogram 06/23/16 - Birads II.  Scheduled for f/u mammogram.

## 2017-08-05 ENCOUNTER — Inpatient Hospital Stay: Admission: RE | Admit: 2017-08-05 | Payer: BC Managed Care – PPO | Source: Ambulatory Visit

## 2017-08-26 ENCOUNTER — Other Ambulatory Visit: Payer: BC Managed Care – PPO

## 2017-09-02 ENCOUNTER — Other Ambulatory Visit (INDEPENDENT_AMBULATORY_CARE_PROVIDER_SITE_OTHER): Payer: BC Managed Care – PPO

## 2017-09-02 DIAGNOSIS — D72819 Decreased white blood cell count, unspecified: Secondary | ICD-10-CM | POA: Diagnosis not present

## 2017-09-02 LAB — CBC WITH DIFFERENTIAL/PLATELET
BASOS ABS: 0 10*3/uL (ref 0.0–0.1)
Basophils Relative: 0.8 % (ref 0.0–3.0)
EOS PCT: 2.9 % (ref 0.0–5.0)
Eosinophils Absolute: 0.1 10*3/uL (ref 0.0–0.7)
HCT: 43.7 % (ref 36.0–46.0)
Hemoglobin: 14.5 g/dL (ref 12.0–15.0)
LYMPHS ABS: 1.4 10*3/uL (ref 0.7–4.0)
Lymphocytes Relative: 30.5 % (ref 12.0–46.0)
MCHC: 33.2 g/dL (ref 30.0–36.0)
MCV: 84.9 fl (ref 78.0–100.0)
MONOS PCT: 7.8 % (ref 3.0–12.0)
Monocytes Absolute: 0.4 10*3/uL (ref 0.1–1.0)
NEUTROS ABS: 2.7 10*3/uL (ref 1.4–7.7)
NEUTROS PCT: 58 % (ref 43.0–77.0)
PLATELETS: 230 10*3/uL (ref 150.0–400.0)
RBC: 5.15 Mil/uL — ABNORMAL HIGH (ref 3.87–5.11)
RDW: 13.7 % (ref 11.5–15.5)
WBC: 4.7 10*3/uL (ref 4.0–10.5)

## 2017-09-03 ENCOUNTER — Encounter: Payer: Self-pay | Admitting: Internal Medicine

## 2017-09-21 ENCOUNTER — Other Ambulatory Visit: Payer: Self-pay | Admitting: Internal Medicine

## 2017-09-21 ENCOUNTER — Inpatient Hospital Stay: Admission: RE | Admit: 2017-09-21 | Payer: BC Managed Care – PPO | Source: Ambulatory Visit

## 2017-09-21 ENCOUNTER — Ambulatory Visit
Admission: RE | Admit: 2017-09-21 | Discharge: 2017-09-21 | Disposition: A | Discharge status: Home / Self Care | Payer: BC Managed Care – PPO | Source: Ambulatory Visit | Attending: Internal Medicine | Admitting: Internal Medicine

## 2017-09-21 DIAGNOSIS — Z1231 Encounter for screening mammogram for malignant neoplasm of breast: Secondary | ICD-10-CM | POA: Diagnosis not present

## 2017-09-21 DIAGNOSIS — Z1239 Encounter for other screening for malignant neoplasm of breast: Secondary | ICD-10-CM

## 2017-10-13 ENCOUNTER — Ambulatory Visit: Payer: BC Managed Care – PPO | Admitting: Internal Medicine

## 2017-10-13 ENCOUNTER — Encounter: Payer: Self-pay | Admitting: Internal Medicine

## 2017-10-13 DIAGNOSIS — E119 Type 2 diabetes mellitus without complications: Secondary | ICD-10-CM

## 2017-10-13 DIAGNOSIS — Z6841 Body Mass Index (BMI) 40.0 and over, adult: Secondary | ICD-10-CM

## 2017-10-13 DIAGNOSIS — K7689 Other specified diseases of liver: Secondary | ICD-10-CM

## 2017-10-13 DIAGNOSIS — I10 Essential (primary) hypertension: Secondary | ICD-10-CM

## 2017-10-13 DIAGNOSIS — D649 Anemia, unspecified: Secondary | ICD-10-CM | POA: Diagnosis not present

## 2017-10-13 DIAGNOSIS — E7800 Pure hypercholesterolemia, unspecified: Secondary | ICD-10-CM

## 2017-10-13 DIAGNOSIS — R945 Abnormal results of liver function studies: Secondary | ICD-10-CM

## 2017-10-13 NOTE — Progress Notes (Signed)
[name], female   DOB: 05-29-1961, 57 y.o.   MRN: [id number]   Subjective:    [name], female    DOB: Sep 12, 1961, 57 y.o.   MRN: [id number]  HPI  Patient here for a scheduled follow up.  She reports she is doing well.  Feels good.  Has adjusted her diet.  Is walking.  Has lost weight.  No chest pain.  No sob.  No acid reflux.  No abdominal pain.  Bowels stable.  Since she started taking vitamin D3 - aching subsided.  Feels good.  Has f/u at liver clinic in 12/2017.     Past Medical History:  Diagnosis Date  . Abnormal liver function   . Allergy   . Anemia   . GERD (gastroesophageal reflux disease)   . Heart murmur   . Hypercholesterolemia   . Hyperglycemia   . Hypertension    Past Surgical History:  Procedure Laterality Date  . ABDOMINAL HYSTERECTOMY  2010  . ACHILLES TENDON REPAIR  2009  . BREAST BIOPSY Left 07/30/2006   benign  . BREAST BIOPSY Left 02/18/2007 & 03/05/2007   benign  . BREAST SURGERY  2008  . LUMBAR MICRODISCECTOMY  2004  . ROTATOR CUFF REPAIR  2006  . SEPTOPLASTY  1990  . TONSILLECTOMY AND ADENOIDECTOMY  1969   Family History  Problem Relation Age of Onset  . Hyperlipidemia Mother   . Hypertension Mother   . Stroke Mother   . Heart disease Mother   . Hypothyroidism Mother   . Pernicious anemia Mother        aunt x 2  . Hyperlipidemia Maternal Grandmother   . Ulcers Maternal Grandmother   . Lung cancer Father        Hx smoker  . Hypertension Maternal Grandfather        MI  . Heart disease Maternal Grandfather        myocardial infarction  . Kidney cancer Paternal Grandmother   . Ovarian cancer Maternal Aunt   . Breast cancer Paternal Aunt   . Diabetes Unknown   . Hypothyroidism Unknown        aunt x 2  . Colon cancer Neg Hx    Social History   Socioeconomic History  . Marital status: Single    Spouse name: None  . Number of children: 0  . Years of education: None  . Highest education level: None    Social Needs  . Financial resource strain: None  . Food insecurity - worry: None  . Food insecurity - inability: None  . Transportation needs - medical: None  . Transportation needs - non-medical: None  Occupational History  . None  Tobacco Use  . Smoking status: Never Smoker  . Smokeless tobacco: Never Used  Substance and Sexual Activity  . Alcohol use: Yes    Alcohol/week: 0.0 oz    Comment: occasional  . Drug use: No  . Sexual activity: None  Other Topics Concern  . None  Social History Narrative   She is single and has no children. She works at [employer] as an Web designer.     Outpatient Encounter Medications as of 10/13/2017  Medication Sig  . atorvastatin (LIPITOR) 10 MG tablet Take 1 tablet (10 mg total) by mouth daily.  . Cholecalciferol (VITAMIN D3) 1000 units CAPS   . Cyanocobalamin (B-12) 250 MCG TABS Take 250 mcg by mouth daily.  . fluticasone (FLONASE) 50 MCG/ACT nasal spray  Place 2 sprays into the nose as needed for rhinitis.  Marland Kitchen lisinopril (PRINIVIL,ZESTRIL) 10 MG tablet Take 1 tablet (10 mg total) by mouth daily.  Marland Kitchen loratadine (CLARITIN) 10 MG tablet Take 10 mg by mouth daily.  . vitamin E 400 UNIT capsule Take 400 Units by mouth daily.   No facility-administered encounter medications on file as of 10/13/2017.     Review of Systems  Constitutional:       Has adjusted diet.  Lost weight.    HENT: Negative for congestion and sinus pressure.   Respiratory: Negative for cough, chest tightness and shortness of breath.   Cardiovascular: Negative for chest pain, palpitations and leg swelling.  Gastrointestinal: Negative for abdominal pain, diarrhea, nausea and vomiting.  Genitourinary: Negative for difficulty urinating and dysuria.  Musculoskeletal: Negative for joint swelling and myalgias.  Skin: Negative for color change and rash.  Neurological: Negative for dizziness, light-headedness and headaches.  Psychiatric/Behavioral: Negative  for agitation and dysphoric mood.       Objective:     Blood pressure rechecked by me:  128/78-80  Physical Exam  Constitutional: She appears well-developed and well-nourished. No distress.  HENT:  Nose: Nose normal.  Mouth/Throat: Oropharynx is clear and moist.  Neck: Neck supple. No thyromegaly present.  Cardiovascular: Normal rate and regular rhythm.  Pulmonary/Chest: Breath sounds normal. No respiratory distress. She has no wheezes.  Abdominal: Soft. Bowel sounds are normal. There is no tenderness.  Musculoskeletal: She exhibits no edema or tenderness.  Lymphadenopathy:    She has no cervical adenopathy.  Skin: No rash noted. No erythema.  Psychiatric: She has a normal mood and affect. Her behavior is normal.    BP 124/70 (BP Location: Left Arm, Patient Position: Sitting, Cuff Size: Large)   Pulse 76   Temp 99.1 F (37.3 C) (Oral)   Resp 16   Wt 225 lb 12.8 oz (102.4 kg)   SpO2 94%   BMI 41.30 kg/m  Wt Readings from Last 3 Encounters:  10/13/17 225 lb 12.8 oz (102.4 kg)  07/13/17 238 lb 6.4 oz (108.1 kg)  04/23/17 238 lb 6.4 oz (108.1 kg)     Lab Results  Component Value Date   WBC 4.7 09/02/2017   HGB 14.5 09/02/2017   HCT 43.7 09/02/2017   PLT 230.0 09/02/2017   GLUCOSE 91 07/13/2017   CHOL 215 (H) 07/09/2017   TRIG 167.0 (H) 07/09/2017   HDL 44.90 07/09/2017   LDLDIRECT 147.0 12/15/2016   LDLCALC 137 (H) 07/09/2017   ALT 73 (H) 07/13/2017   AST 50 (H) 07/13/2017   NA 141 07/13/2017   K 4.5 07/13/2017   CL 103 07/13/2017   CREATININE 0.78 07/13/2017   BUN 15 07/13/2017   CO2 28 07/13/2017   TSH 3.53 07/13/2017   INR 1.0 07/13/2017   HGBA1C 6.6 (H) 07/09/2017   MICROALBUR 0.9 07/09/2017    Mm Screening Breast Tomo Bilateral  Result Date: 09/21/2017 CLINICAL DATA:  Screening. EXAM: 2D DIGITAL SCREENING BILATERAL MAMMOGRAM WITH 3D TOMO WITH CAD COMPARISON:  Previous exam(s). ACR Breast Density Category b: There are scattered areas of  fibroglandular density. FINDINGS: There are no findings suspicious for malignancy. Images were processed with CAD. IMPRESSION: No mammographic evidence of malignancy. A result letter of this screening mammogram will be mailed directly to the patient. RECOMMENDATION: Screening mammogram in one year. (Code:SM-B-01Y) BI-RADS CATEGORY  1: Negative. Electronically Signed   By: Lovey Newcomer M.D.   On: 09/21/2017 09:37  Assessment & Plan:   Problem List Items Addressed This Visit    Abnormal liver function    S/p liver biopsy.  NASH.  She has adjusted her diet.  Lost weight.  Follow liver function tests.        Anemia    Follow cbc and ferritin.        BMI 40.0-44.9, adult [facility])    She has adjusted her diet.  Lost weight.  Continue diet and exercise.  Follow.        Diabetes mellitus without complication ([facility])    Low carb diet and exercise.  She has adjusted her diet.  Lost weight.  Follow met b and a1c.        Relevant Orders   Hemoglobin Q3A   Basic metabolic panel   Hypercholesteremia    On lipitor.  Low cholesterol diet and exercise.  Follow lipid panel and liver function tests.       Relevant Orders   Hepatic function panel   Lipid panel   Hypertension    Blood pressure under good control.  Continue same medication regimen.  Follow pressures.  Follow metabolic panel.            Einar Pheasant, MD

## 2017-10-17 ENCOUNTER — Encounter: Payer: Self-pay | Admitting: Internal Medicine

## 2017-10-17 NOTE — Assessment & Plan Note (Signed)
S/p liver biopsy.  NASH.  She has adjusted her diet.  Lost weight.  Follow liver function tests.

## 2017-10-17 NOTE — Assessment & Plan Note (Signed)
Follow cbc and ferritin.  

## 2017-10-17 NOTE — Assessment & Plan Note (Signed)
Low carb diet and exercise.  She has adjusted her diet.  Lost weight.  Follow met b and a1c.

## 2017-10-17 NOTE — Assessment & Plan Note (Signed)
Blood pressure under good control.  Continue same medication regimen.  Follow pressures.  Follow metabolic panel.   

## 2017-10-17 NOTE — Assessment & Plan Note (Signed)
On lipitor.  Low cholesterol diet and exercise.  Follow lipid panel and liver function tests.   

## 2017-10-17 NOTE — Assessment & Plan Note (Signed)
She has adjusted her diet.  Lost weight.  Continue diet and exercise.  Follow.

## 2017-11-11 ENCOUNTER — Other Ambulatory Visit: Payer: BC Managed Care – PPO

## 2017-11-19 ENCOUNTER — Other Ambulatory Visit (INDEPENDENT_AMBULATORY_CARE_PROVIDER_SITE_OTHER): Payer: BC Managed Care – PPO

## 2017-11-19 DIAGNOSIS — E119 Type 2 diabetes mellitus without complications: Secondary | ICD-10-CM

## 2017-11-19 DIAGNOSIS — E7800 Pure hypercholesterolemia, unspecified: Secondary | ICD-10-CM

## 2017-11-19 LAB — HEPATIC FUNCTION PANEL
ALK PHOS: 47 U/L (ref 39–117)
ALT: 54 U/L — ABNORMAL HIGH (ref 0–35)
AST: 45 U/L — AB (ref 0–37)
Albumin: 4.3 g/dL (ref 3.5–5.2)
BILIRUBIN TOTAL: 0.5 mg/dL (ref 0.2–1.2)
Bilirubin, Direct: 0.1 mg/dL (ref 0.0–0.3)
Total Protein: 7 g/dL (ref 6.0–8.3)

## 2017-11-19 LAB — LIPID PANEL
Cholesterol: 188 mg/dL (ref 0–200)
HDL: 52.1 mg/dL (ref >39.00)
LDL Cholesterol: 112 mg/dL — ABNORMAL HIGH (ref 0–99)
NONHDL: 135.68
Total CHOL/HDL Ratio: 4
Triglycerides: 118 mg/dL (ref 0.0–149.0)
VLDL: 23.6 mg/dL (ref 0.0–40.0)

## 2017-11-19 LAB — BASIC METABOLIC PANEL
BUN: 17 mg/dL (ref 6–23)
CALCIUM: 9.5 mg/dL (ref 8.4–10.5)
CO2: 30 mEq/L (ref 19–32)
Chloride: 100 mEq/L (ref 96–112)
Creatinine, Ser: 0.85 mg/dL (ref 0.40–1.20)
GFR: 73.26 mL/min (ref >60.00)
GLUCOSE: 100 mg/dL — AB (ref 70–99)
Potassium: 4.2 mEq/L (ref 3.5–5.1)
Sodium: 136 mEq/L (ref 135–145)

## 2017-11-19 LAB — HEMOGLOBIN A1C: HEMOGLOBIN A1C: 6 % (ref 4.6–6.5)

## 2017-11-20 ENCOUNTER — Encounter: Payer: Self-pay | Admitting: Internal Medicine

## 2018-01-01 ENCOUNTER — Other Ambulatory Visit: Payer: Self-pay | Admitting: Internal Medicine

## 2018-01-13 ENCOUNTER — Other Ambulatory Visit: Payer: Self-pay

## 2018-01-13 ENCOUNTER — Ambulatory Visit
Admission: EM | Admit: 2018-01-13 | Discharge: 2018-01-13 | Disposition: A | Discharge status: Home / Self Care | Payer: BC Managed Care – PPO | Attending: Family Medicine | Admitting: Family Medicine

## 2018-01-13 DIAGNOSIS — J0100 Acute maxillary sinusitis, unspecified: Secondary | ICD-10-CM | POA: Diagnosis not present

## 2018-01-13 MED ORDER — DOXYCYCLINE HYCLATE 100 MG PO CAPS: 100.0000 mg | ORAL_CAPSULE | Freq: Two times a day (BID) | ORAL | 0 refills | Status: DC
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
## 2018-01-13 NOTE — Discharge Instructions (Signed)
Take medication as prescribed. Rest. Drink plenty of fluids.  ° °Follow up with your primary care physician this week as needed. Return to Urgent care for new or worsening concerns.  ° °

## 2018-01-13 NOTE — ED Triage Notes (Signed)
"  I have a sinus infection." Headache, right sided facial pain, green nasal drainage. Pain 3/10

## 2018-01-14 ENCOUNTER — Ambulatory Visit: Payer: BC Managed Care – PPO | Admitting: Internal Medicine

## 2018-02-11 ENCOUNTER — Encounter: Payer: Self-pay | Admitting: Internal Medicine

## 2018-02-11 ENCOUNTER — Ambulatory Visit: Payer: BC Managed Care – PPO | Admitting: Internal Medicine

## 2018-02-11 DIAGNOSIS — E119 Type 2 diabetes mellitus without complications: Secondary | ICD-10-CM

## 2018-02-11 DIAGNOSIS — Z6838 Body mass index (BMI) 38.0-38.9, adult: Secondary | ICD-10-CM

## 2018-02-11 DIAGNOSIS — R945 Abnormal results of liver function studies: Secondary | ICD-10-CM | POA: Diagnosis not present

## 2018-02-11 DIAGNOSIS — Z0184 Encounter for antibody response examination: Secondary | ICD-10-CM | POA: Diagnosis not present

## 2018-02-11 DIAGNOSIS — D649 Anemia, unspecified: Secondary | ICD-10-CM | POA: Diagnosis not present

## 2018-02-11 DIAGNOSIS — I10 Essential (primary) hypertension: Secondary | ICD-10-CM

## 2018-02-11 DIAGNOSIS — E7800 Pure hypercholesterolemia, unspecified: Secondary | ICD-10-CM

## 2018-02-11 MED ORDER — ALBUTEROL SULFATE HFA 108 (90 BASE) MCG/ACT IN AERS: 2.0000 | INHALATION_SPRAY | Freq: Four times a day (QID) | RESPIRATORY_TRACT | 2 refills | Status: DC | PRN

## 2018-02-11 NOTE — Progress Notes (Addendum)
[name], female   DOB: 1961/09/03, 57 y.o.   MRN: [id number]   Subjective:    [name], female    DOB: 01/15/1961, 57 y.o.   MRN: [id number]  HPI  Patient here for a scheduled follow up.  She reports she is doing well.  Feels good.  Has joined a gym.  Plans to start exercising more.  No chest pain.  No sob.  No acid reflux.  No abdominal pain.  Bowels moving.  No urine change.  Handling stress.  Overall she feels she is doing relatively well.  Discussed diet and exercise.  She request to have MMR titers checked.     Past Medical History:  Diagnosis Date  . Abnormal liver function   . Allergy   . Anemia   . GERD (gastroesophageal reflux disease)   . Heart murmur   . Hypercholesterolemia   . Hyperglycemia   . Hypertension    Past Surgical History:  Procedure Laterality Date  . ABDOMINAL HYSTERECTOMY  2010  . ACHILLES TENDON REPAIR  2009  . BREAST BIOPSY Left 07/30/2006   benign  . BREAST BIOPSY Left 02/18/2007 & 03/05/2007   benign  . BREAST SURGERY  2008  . LUMBAR MICRODISCECTOMY  2004  . ROTATOR CUFF REPAIR  2006  . SEPTOPLASTY  1990  . TONSILLECTOMY AND ADENOIDECTOMY  1969   Family History  Problem Relation Age of Onset  . Hyperlipidemia Mother   . Hypertension Mother   . Stroke Mother   . Heart disease Mother   . Hypothyroidism Mother   . Pernicious anemia Mother        aunt x 2  . Hyperlipidemia Maternal Grandmother   . Ulcers Maternal Grandmother   . Lung cancer Father        Hx smoker  . Hypertension Maternal Grandfather        MI  . Heart disease Maternal Grandfather        myocardial infarction  . Kidney cancer Paternal Grandmother   . Ovarian cancer Maternal Aunt   . Breast cancer Paternal Aunt   . Diabetes Unknown   . Hypothyroidism Unknown        aunt x 2  . Colon cancer Neg Hx    Social History   Socioeconomic History  . Marital status: Single    Spouse name: Not on file  . Number of children: 0  . Years  of education: Not on file  . Highest education level: Not on file  Occupational History  . Not on file  Social Needs  . Financial resource strain: Not on file  . Food insecurity:    Worry: Not on file    Inability: Not on file  . Transportation needs:    Medical: Not on file    Non-medical: Not on file  Tobacco Use  . Smoking status: Never Smoker  . Smokeless tobacco: Never Used  Substance and Sexual Activity  . Alcohol use: Yes    Alcohol/week: 0.0 oz    Comment: rare  . Drug use: No  . Sexual activity: Not on file  Lifestyle  . Physical activity:    Days per week: Not on file    Minutes per session: Not on file  . Stress: Not on file  Relationships  . Social connections:    Talks on phone: Not on file    Gets together: Not on file    Attends religious service: Not on file  Active member of club or organization: Not on file    Attends meetings of clubs or organizations: Not on file    Relationship status: Not on file  Other Topics Concern  . Not on file  Social History Narrative   She is single and has no children. 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     Outpatient Encounter Medications as of 02/11/2018  Medication Sig  . albuterol (PROVENTIL HFA;VENTOLIN HFA) 108 (90 Base) MCG/ACT inhaler Inhale 2 puffs into the lungs every 6 (six) hours as needed for wheezing or shortness of breath.  . atorvastatin (LIPITOR) 10 MG tablet Take 1 tablet (10 mg total) by mouth daily.  . Cholecalciferol (VITAMIN D3) 1000 units CAPS   . Cyanocobalamin (B-12) 250 MCG TABS Take 250 mcg by mouth daily.  . doxycycline (VIBRAMYCIN) 100 MG capsule Take 1 capsule (100 mg total) by mouth 2 (two) times daily.  . fluticasone (FLONASE) 50 MCG/ACT nasal spray Place 2 sprays into the nose as needed for rhinitis.  . lisinopril (PRINIVIL,ZESTRIL) 10 MG tablet Take 1 tablet (10 mg total) by mouth daily.  . loratadine (CLARITIN) 10 MG tablet Take 10 mg by mouth daily.    . vitamin E 400 UNIT capsule Take 400 Units by mouth daily.   No facility-administered encounter medications on file as of 02/11/2018.     Review of Systems  Constitutional: Negative for appetite change and unexpected weight change.  HENT: Negative for sinus pressure.   Respiratory: Negative for cough, chest tightness and shortness of breath.   Cardiovascular: Negative for chest pain, palpitations and leg swelling.  Gastrointestinal: Negative for abdominal pain, diarrhea, nausea and vomiting.  Genitourinary: Negative for difficulty urinating and dysuria.  Musculoskeletal: Negative for joint swelling and myalgias.  Skin: Negative for color change and rash.  Neurological: Negative for dizziness, light-headedness and headaches.  Psychiatric/Behavioral: Negative for agitation and dysphoric mood.       Objective:     Blood pressure rechecked by me:  120/82  Physical Exam  Constitutional: She appears well-developed and well-nourished. No distress.  HENT:  Nose: Nose normal.  Mouth/Throat: Oropharynx is clear and moist.  Neck: Neck supple. No thyromegaly present.  Cardiovascular: Normal rate and regular rhythm.  Pulmonary/Chest: Breath sounds normal. No respiratory distress. She has no wheezes.  Abdominal: Soft. Bowel sounds are normal. There is no tenderness.  Musculoskeletal: She exhibits no edema or tenderness.  Lymphadenopathy:    She has no cervical adenopathy.  Skin: No rash noted. No erythema.  Psychiatric: She has a normal mood and affect. Her behavior is normal.    BP 130/78 (BP Location: Left Arm, Patient Position: Sitting, Cuff Size: Large)   Pulse 73   Temp 98.3 F (36.8 C) (Oral)   Resp 18   Wt 217 lb 6.4 oz (98.6 kg)   SpO2 96%   BMI 38.51 kg/m  Wt Readings from Last 3 Encounters:  02/11/18 217 lb 6.4 oz (98.6 kg)  01/13/18 210 lb (95.3 kg)  10/13/17 225 lb 12.8 oz (102.4 kg)     Lab Results  Component Value Date   WBC 4.7 09/02/2017   HGB 14.5  09/02/2017   HCT 43.7 09/02/2017   PLT 230.0 09/02/2017   GLUCOSE 100 (H) 11/19/2017   CHOL 188 11/19/2017   TRIG 118.0 11/19/2017   HDL 52.10 11/19/2017   LDLDIRECT 147.0 12/15/2016   LDLCALC 112 (H) 11/19/2017   ALT 54 (H) 11/19/2017   AST 45 (H) 11/19/2017     NA 136 11/19/2017   K 4.2 11/19/2017   CL 100 11/19/2017   CREATININE 0.85 11/19/2017   BUN 17 11/19/2017   CO2 30 11/19/2017   TSH 3.53 07/13/2017   INR 1.0 07/13/2017   HGBA1C 6.0 11/19/2017   MICROALBUR 0.9 07/09/2017       Assessment & Plan:   Problem List Items Addressed This Visit    Abnormal liver function    S/p liver biopsy.  NASH.  Discussed diet and exercise.  Follow liver function tests.        Relevant Orders   Hepatic function panel   Anemia    Follow cbc.        BMI 38.0-38.9,adult    Discussed diet and exercise.  Follow.        Diabetes mellitus without complication (HCC)    Low carb diet and exercise.  Follow met b and a1c.        Relevant Orders   Hemoglobin A1c   Basic metabolic panel   Hypercholesteremia    On lipitor.  Low cholesterol diet and exercise.  Follow lipid panel and liver function tests.        Relevant Orders   Lipid panel   Hypertension    Blood pressure under good control.  Continue same medication regimen.  Follow pressures.  Follow metabolic panel.         Other Visit Diagnoses    Immunity status testing       Relevant Orders   Measles/Mumps/Rubella Immunity       SCOTT, CHARLENE, MD  

## 2018-02-14 ENCOUNTER — Encounter: Payer: Self-pay | Admitting: Internal Medicine

## 2018-02-14 NOTE — Addendum Note (Signed)
Addended by: Alisa Graff on: 02/14/2018 03:07 PM   Modules accepted: Orders

## 2018-02-14 NOTE — Assessment & Plan Note (Signed)
Discussed diet and exercise.  Follow.  

## 2018-02-14 NOTE — Assessment & Plan Note (Signed)
On lipitor.  Low cholesterol diet and exercise.  Follow lipid panel and liver function tests.   

## 2018-02-14 NOTE — Assessment & Plan Note (Signed)
S/p liver biopsy.  NASH.  Discussed diet and exercise.  Follow liver function tests.

## 2018-02-14 NOTE — Assessment & Plan Note (Signed)
Low carb diet and exercise.  Follow met b and a1c.   

## 2018-02-14 NOTE — Assessment & Plan Note (Signed)
Blood pressure under good control.  Continue same medication regimen.  Follow pressures.  Follow metabolic panel.   

## 2018-02-14 NOTE — Assessment & Plan Note (Signed)
Follow cbc.  

## 2018-06-24 ENCOUNTER — Encounter: Payer: Self-pay | Admitting: Internal Medicine

## 2018-06-24 ENCOUNTER — Ambulatory Visit: Payer: BC Managed Care – PPO | Admitting: Internal Medicine

## 2018-06-24 VITALS — BP 136/84 | HR 68 | Temp 98.8°F | Resp 15 | Ht 62.0 in | Wt 225.4 lb

## 2018-06-24 DIAGNOSIS — T8090XA Unspecified complication following infusion and therapeutic injection, initial encounter: Secondary | ICD-10-CM

## 2018-06-24 DIAGNOSIS — Z6841 Body Mass Index (BMI) 40.0 and over, adult: Secondary | ICD-10-CM | POA: Diagnosis not present

## 2018-06-24 DIAGNOSIS — D649 Anemia, unspecified: Secondary | ICD-10-CM

## 2018-06-24 DIAGNOSIS — I10 Essential (primary) hypertension: Secondary | ICD-10-CM

## 2018-06-24 DIAGNOSIS — E559 Vitamin D deficiency, unspecified: Secondary | ICD-10-CM

## 2018-06-24 DIAGNOSIS — E7800 Pure hypercholesterolemia, unspecified: Secondary | ICD-10-CM

## 2018-06-24 DIAGNOSIS — R945 Abnormal results of liver function studies: Secondary | ICD-10-CM | POA: Diagnosis not present

## 2018-06-24 DIAGNOSIS — E119 Type 2 diabetes mellitus without complications: Secondary | ICD-10-CM

## 2018-06-24 NOTE — Progress Notes (Signed)
[name], female   DOB: 1961/01/14, 57 y.o.   MRN: [id number]   Subjective:    [name], female    DOB: August 01, 1961, 57 y.o.   MRN: [id number]  HPI  Patient here for a scheduled follow up. States she is doing relatively well.  Increased stress, especially at work.  Discussed with her today.  Overall she feels she is handling things relatively well.  Does not feel needs any further intervention.  No chest pain.  No sob. No acid reflux.  No abdominal pain.  Bowels moving.  Has gained some weight.  Discussed diet, exercise and need for weight loss.  Following liver function.  Had flu shot.  Had local reaction.  No fever.  No other rash. No lip or tongue swelling.     Past Medical History:  Diagnosis Date  . Abnormal liver function   . Allergy   . Anemia   . GERD (gastroesophageal reflux disease)   . Heart murmur   . Hypercholesterolemia   . Hyperglycemia   . Hypertension    Past Surgical History:  Procedure Laterality Date  . ABDOMINAL HYSTERECTOMY  2010  . ACHILLES TENDON REPAIR  2009  . BREAST BIOPSY Left 07/30/2006   benign  . BREAST BIOPSY Left 02/18/2007 & 03/05/2007   benign  . BREAST SURGERY  2008  . LUMBAR MICRODISCECTOMY  2004  . ROTATOR CUFF REPAIR  2006  . SEPTOPLASTY  1990  . TONSILLECTOMY AND ADENOIDECTOMY  1969   Family History  Problem Relation Age of Onset  . Hyperlipidemia Mother   . Hypertension Mother   . Stroke Mother   . Heart disease Mother   . Hypothyroidism Mother   . Pernicious anemia Mother        aunt x 2  . Hyperlipidemia Maternal Grandmother   . Ulcers Maternal Grandmother   . Lung cancer Father        Hx smoker  . Hypertension Maternal Grandfather        MI  . Heart disease Maternal Grandfather        myocardial infarction  . Kidney cancer Paternal Grandmother   . Ovarian cancer Maternal Aunt   . Breast cancer Paternal Aunt   . Diabetes Unknown   . Hypothyroidism Unknown        aunt x 2  . Colon  cancer Neg Hx    Social History   Socioeconomic History  . Marital status: Single    Spouse name: Not on file  . Number of children: 0  . Years of education: Not on file  . Highest education level: Not on file  Occupational History  . Not on file  Social Needs  . Financial resource strain: Not on file  . Food insecurity:    Worry: Not on file    Inability: Not on file  . Transportation needs:    Medical: Not on file    Non-medical: Not on file  Tobacco Use  . Smoking status: Never Smoker  . Smokeless tobacco: Never Used  Substance and Sexual Activity  . Alcohol use: Yes    Alcohol/week: 0.0 standard drinks    Comment: rare  . Drug use: No  . Sexual activity: Not on file  Lifestyle  . Physical activity:    Days per week: Not on file    Minutes per session: Not on file  . Stress: Not on file  Relationships  . Social connections:    Talks  on phone: Not on file    Gets together: Not on file    Attends religious service: Not on file    Active member of club or organization: Not on file    Attends meetings of clubs or organizations: Not on file    Relationship status: Not on file  Other Topics Concern  . Not on file  Social History Narrative   She is single and has no children. She works at [employer] as an Web designer.     Outpatient Encounter Medications as of 06/24/2018  Medication Sig  . albuterol (PROVENTIL HFA;VENTOLIN HFA) 108 (90 Base) MCG/ACT inhaler Inhale 2 puffs into the lungs every 6 (six) hours as needed for wheezing or shortness of breath.  Marland Kitchen atorvastatin (LIPITOR) 10 MG tablet Take 1 tablet (10 mg total) by mouth daily.  . Cholecalciferol (VITAMIN D3) 1000 units CAPS   . Cyanocobalamin (B-12) 250 MCG TABS Take 250 mcg by mouth daily.  Marland Kitchen doxycycline (VIBRAMYCIN) 100 MG capsule Take 1 capsule (100 mg total) by mouth 2 (two) times daily.  . fluticasone (FLONASE) 50 MCG/ACT nasal spray Place 2 sprays into the nose as needed for  rhinitis.  Marland Kitchen lisinopril (PRINIVIL,ZESTRIL) 10 MG tablet Take 1 tablet (10 mg total) by mouth daily.  Marland Kitchen loratadine (CLARITIN) 10 MG tablet Take 10 mg by mouth daily.  . vitamin E 400 UNIT capsule Take 400 Units by mouth daily.   No facility-administered encounter medications on file as of 06/24/2018.     Review of Systems  Constitutional: Negative for appetite change.       Weight has increased.    HENT: Negative for congestion and sinus pressure.   Respiratory: Negative for cough, chest tightness and shortness of breath.   Cardiovascular: Negative for chest pain, palpitations and leg swelling.  Gastrointestinal: Negative for abdominal pain, diarrhea, nausea and vomiting.  Genitourinary: Negative for difficulty urinating and dysuria.  Musculoskeletal: Negative for joint swelling and myalgias.  Skin: Negative for color change.       Redness - left upper arm.  Does not extend into the forearm.    Neurological: Negative for dizziness, light-headedness and headaches.  Psychiatric/Behavioral: Negative for agitation and dysphoric mood.       Objective:    Physical Exam  Constitutional: She appears well-developed and well-nourished. No distress.  HENT:  Nose: Nose normal.  Mouth/Throat: Oropharynx is clear and moist.  Neck: Neck supple. No thyromegaly present.  Cardiovascular: Normal rate and regular rhythm.  Pulmonary/Chest: Breath sounds normal. No respiratory distress. She has no wheezes.  Abdominal: Soft. Bowel sounds are normal. There is no tenderness.  Musculoskeletal: She exhibits no edema or tenderness.  Lymphadenopathy:    She has no cervical adenopathy.  Skin: Rash noted. There is erythema.  Redness around flu shot injection site.    Psychiatric: She has a normal mood and affect. Her behavior is normal.    BP 136/84 (BP Location: Left Arm, Patient Position: Sitting, Cuff Size: Large)   Pulse 68   Temp 98.8 F (37.1 C) (Oral)   Resp 15   Ht 5' 2"  (1.575 m)   Wt 225  lb 6 oz (102.2 kg)   SpO2 98%   BMI 41.22 kg/m  Wt Readings from Last 3 Encounters:  06/24/18 225 lb 6 oz (102.2 kg)  02/11/18 217 lb 6.4 oz (98.6 kg)  01/13/18 210 lb (95.3 kg)     Lab Results  Component Value Date   WBC 4.7 09/02/2017  HGB 14.5 09/02/2017   HCT 43.7 09/02/2017   PLT 230.0 09/02/2017   GLUCOSE 100 (H) 11/19/2017   CHOL 188 11/19/2017   TRIG 118.0 11/19/2017   HDL 52.10 11/19/2017   LDLDIRECT 147.0 12/15/2016   LDLCALC 112 (H) 11/19/2017   ALT 54 (H) 11/19/2017   AST 45 (H) 11/19/2017   NA 136 11/19/2017   K 4.2 11/19/2017   CL 100 11/19/2017   CREATININE 0.85 11/19/2017   BUN 17 11/19/2017   CO2 30 11/19/2017   TSH 3.53 07/13/2017   INR 1.0 07/13/2017   HGBA1C 6.0 11/19/2017   MICROALBUR 0.9 07/09/2017       Assessment & Plan:   Problem List Items Addressed This Visit    Abnormal liver function    S/p liver biopsy.  NASH.  Discussed again with her today regarding the importance of diet, exercise and weight loss.  Follow liver function tests.        Anemia    Follow cbc.       BMI 40.0-44.9, adult (HCC)    Discussed diet and exercise.  Follow.       Diabetes mellitus without complication (HCC)    Low carb diet and exercise.  Discussed importance of weight loss.  Follow met b and a1c.        Hypercholesteremia    On lipitor.  Low cholesterol diet and exercise.  Follow lipid panel and liver function tests.        Hypertension    Blood pressure under good control.  Continue same medication regimen.  Follow pressures.  Follow metabolic panel.        Relevant Orders   TSH    Other Visit Diagnoses    Injection site reaction, initial encounter    -  Primary   Rash left upper arm.  Taking antihistamin.  Follow.  Notify me if any worsening or does not completely resolve.     Vitamin D deficiency       Relevant Orders   VITAMIN D 25 Hydroxy (Vit-D Deficiency, Fractures)       Einar Pheasant, MD

## 2018-06-27 ENCOUNTER — Encounter: Payer: Self-pay | Admitting: Internal Medicine

## 2018-06-27 NOTE — Assessment & Plan Note (Signed)
Discussed diet and exercise.  Follow.  

## 2018-06-27 NOTE — Assessment & Plan Note (Signed)
Follow cbc.  

## 2018-06-27 NOTE — Assessment & Plan Note (Signed)
Blood pressure under good control.  Continue same medication regimen.  Follow pressures.  Follow metabolic panel.   

## 2018-06-27 NOTE — Assessment & Plan Note (Signed)
On lipitor.  Low cholesterol diet and exercise.  Follow lipid panel and liver function tests.   

## 2018-06-27 NOTE — Assessment & Plan Note (Signed)
Low carb diet and exercise.  Discussed importance of weight loss.  Follow met b and a1c.

## 2018-06-27 NOTE — Assessment & Plan Note (Signed)
S/p liver biopsy.  NASH.  Discussed again with her today regarding the importance of diet, exercise and weight loss.  Follow liver function tests.

## 2018-07-11 ENCOUNTER — Other Ambulatory Visit: Payer: Self-pay | Admitting: Internal Medicine

## 2018-07-13 ENCOUNTER — Other Ambulatory Visit: Payer: BC Managed Care – PPO

## 2018-07-14 ENCOUNTER — Other Ambulatory Visit (INDEPENDENT_AMBULATORY_CARE_PROVIDER_SITE_OTHER): Payer: BC Managed Care – PPO

## 2018-07-14 DIAGNOSIS — R945 Abnormal results of liver function studies: Secondary | ICD-10-CM

## 2018-07-14 DIAGNOSIS — I10 Essential (primary) hypertension: Secondary | ICD-10-CM | POA: Diagnosis not present

## 2018-07-14 DIAGNOSIS — E559 Vitamin D deficiency, unspecified: Secondary | ICD-10-CM | POA: Diagnosis not present

## 2018-07-14 DIAGNOSIS — E7800 Pure hypercholesterolemia, unspecified: Secondary | ICD-10-CM

## 2018-07-14 DIAGNOSIS — E119 Type 2 diabetes mellitus without complications: Secondary | ICD-10-CM

## 2018-07-14 DIAGNOSIS — Z0184 Encounter for antibody response examination: Secondary | ICD-10-CM

## 2018-07-14 LAB — HEPATIC FUNCTION PANEL
ALT: 49 U/L — AB (ref 0–35)
AST: 39 U/L — AB (ref 0–37)
Albumin: 4.5 g/dL (ref 3.5–5.2)
Alkaline Phosphatase: 59 U/L (ref 39–117)
Bilirubin, Direct: 0.1 mg/dL (ref 0.0–0.3)
Total Bilirubin: 0.6 mg/dL (ref 0.2–1.2)
Total Protein: 7.3 g/dL (ref 6.0–8.3)

## 2018-07-14 LAB — LIPID PANEL
CHOLESTEROL: 206 mg/dL — AB (ref 0–200)
HDL: 50 mg/dL (ref >39.00)
LDL Cholesterol: 124 mg/dL — ABNORMAL HIGH (ref 0–99)
NONHDL: 156.24
Total CHOL/HDL Ratio: 4
Triglycerides: 163 mg/dL — ABNORMAL HIGH (ref 0.0–149.0)
VLDL: 32.6 mg/dL (ref 0.0–40.0)

## 2018-07-14 LAB — BASIC METABOLIC PANEL
BUN: 16 mg/dL (ref 6–23)
CALCIUM: 9.7 mg/dL (ref 8.4–10.5)
CO2: 28 mEq/L (ref 19–32)
CREATININE: 0.91 mg/dL (ref 0.40–1.20)
Chloride: 100 mEq/L (ref 96–112)
GFR: 67.56 mL/min (ref >60.00)
Glucose, Bld: 128 mg/dL — ABNORMAL HIGH (ref 70–99)
Potassium: 4.3 mEq/L (ref 3.5–5.1)
SODIUM: 136 meq/L (ref 135–145)

## 2018-07-14 LAB — VITAMIN D 25 HYDROXY (VIT D DEFICIENCY, FRACTURES): VITD: 30.95 ng/mL (ref 30.00–100.00)

## 2018-07-14 LAB — TSH: TSH: 2.93 u[IU]/mL (ref 0.35–4.50)

## 2018-07-14 LAB — HEMOGLOBIN A1C: HEMOGLOBIN A1C: 6.3 % (ref 4.6–6.5)

## 2018-07-15 ENCOUNTER — Encounter: Payer: Self-pay | Admitting: Internal Medicine

## 2018-07-15 LAB — MEASLES/MUMPS/RUBELLA IMMUNITY
Mumps IgG: >300 AU/mL
RUBELLA: 5.41 {index}
Rubeola IgG: 269 AU/mL

## 2018-07-16 ENCOUNTER — Encounter: Payer: Self-pay | Admitting: Internal Medicine

## 2018-10-19 ENCOUNTER — Ambulatory Visit (INDEPENDENT_AMBULATORY_CARE_PROVIDER_SITE_OTHER): Payer: BC Managed Care – PPO | Admitting: Internal Medicine

## 2018-10-19 ENCOUNTER — Encounter: Payer: Self-pay | Admitting: Internal Medicine

## 2018-10-19 ENCOUNTER — Other Ambulatory Visit (HOSPITAL_COMMUNITY)
Admission: RE | Admit: 2018-10-19 | Discharge: 2018-10-19 | Disposition: A | Discharge status: Home / Self Care | Payer: BC Managed Care – PPO | Source: Ambulatory Visit | Attending: Internal Medicine | Admitting: Internal Medicine

## 2018-10-19 VITALS — BP 128/78 | HR 87 | Temp 98.0°F | Resp 16 | Ht 62.0 in | Wt 231.2 lb

## 2018-10-19 DIAGNOSIS — E119 Type 2 diabetes mellitus without complications: Secondary | ICD-10-CM

## 2018-10-19 DIAGNOSIS — Z0000 Encounter for general adult medical examination without abnormal findings: Secondary | ICD-10-CM | POA: Diagnosis not present

## 2018-10-19 DIAGNOSIS — R945 Abnormal results of liver function studies: Secondary | ICD-10-CM

## 2018-10-19 DIAGNOSIS — Z90710 Acquired absence of both cervix and uterus: Secondary | ICD-10-CM | POA: Diagnosis not present

## 2018-10-19 DIAGNOSIS — Z6841 Body Mass Index (BMI) 40.0 and over, adult: Secondary | ICD-10-CM

## 2018-10-19 DIAGNOSIS — Z1151 Encounter for screening for human papillomavirus (HPV): Secondary | ICD-10-CM | POA: Insufficient documentation

## 2018-10-19 DIAGNOSIS — I10 Essential (primary) hypertension: Secondary | ICD-10-CM

## 2018-10-19 DIAGNOSIS — Z124 Encounter for screening for malignant neoplasm of cervix: Secondary | ICD-10-CM

## 2018-10-19 DIAGNOSIS — E7800 Pure hypercholesterolemia, unspecified: Secondary | ICD-10-CM

## 2018-10-19 DIAGNOSIS — D649 Anemia, unspecified: Secondary | ICD-10-CM

## 2018-10-19 DIAGNOSIS — L409 Psoriasis, unspecified: Secondary | ICD-10-CM

## 2018-10-19 DIAGNOSIS — Z1239 Encounter for other screening for malignant neoplasm of breast: Secondary | ICD-10-CM

## 2018-10-19 MED ORDER — LISINOPRIL 10 MG PO TABS: ORAL_TABLET | ORAL | 1 refills | Status: DC

## 2018-10-19 NOTE — Progress Notes (Signed)
[name], female   DOB: Mar 26, 1961, 58 y.o.   MRN: [id number]   Subjective:    [name], female    DOB: 08-Feb-1961, 58 y.o.   MRN: [id number]  HPI  Patient here for her physical exam.  She reports she is doing relatively well.  Tries to stay active.  Discussed diet and exercise.  No chest pain.  No sob.  No acid reflux.  No abdominal pain.  Bowels moving.  No urine change.  States blood pressures doing well.     Past Medical History:  Diagnosis Date  . Abnormal liver function   . Allergy   . Anemia   . GERD (gastroesophageal reflux disease)   . Heart murmur   . Hypercholesterolemia   . Hyperglycemia   . Hypertension    Past Surgical History:  Procedure Laterality Date  . ABDOMINAL HYSTERECTOMY  2010  . ACHILLES TENDON REPAIR  2009  . BREAST BIOPSY Left 07/30/2006   benign  . BREAST BIOPSY Left 02/18/2007 & 03/05/2007   benign  . BREAST SURGERY  2008  . LUMBAR MICRODISCECTOMY  2004  . ROTATOR CUFF REPAIR  2006  . SEPTOPLASTY  1990  . TONSILLECTOMY AND ADENOIDECTOMY  1969   Family History  Problem Relation Age of Onset  . Hyperlipidemia Mother   . Hypertension Mother   . Stroke Mother   . Heart disease Mother   . Hypothyroidism Mother   . Pernicious anemia Mother        aunt x 2  . Hyperlipidemia Maternal Grandmother   . Ulcers Maternal Grandmother   . Lung cancer Father        Hx smoker  . Hypertension Maternal Grandfather        MI  . Heart disease Maternal Grandfather        myocardial infarction  . Kidney cancer Paternal Grandmother   . Ovarian cancer Maternal Aunt   . Breast cancer Paternal Aunt   . Diabetes Other   . Hypothyroidism Other        aunt x 2  . Colon cancer Neg Hx    Social History   Socioeconomic History  . Marital status: Single    Spouse name: Not on file  . Number of children: 0  . Years of education: Not on file  . Highest education level: Not on file  Occupational History  . Not on file    Social Needs  . Financial resource strain: Not on file  . Food insecurity:    Worry: Not on file    Inability: Not on file  . Transportation needs:    Medical: Not on file    Non-medical: Not on file  Tobacco Use  . Smoking status: Never Smoker  . Smokeless tobacco: Never Used  Substance and Sexual Activity  . Alcohol use: Yes    Alcohol/week: 0.0 standard drinks    Comment: rare  . Drug use: No  . Sexual activity: Not on file  Lifestyle  . Physical activity:    Days per week: Not on file    Minutes per session: Not on file  . Stress: Not on file  Relationships  . Social connections:    Talks on phone: Not on file    Gets together: Not on file    Attends religious service: Not on file    Active member of club or organization: Not on file    Attends meetings of clubs or organizations: Not  on file    Relationship status: Not on file  Other Topics Concern  . Not on file  Social History Narrative   She is single and has no children. She works at [employer] as an Web designer.     Outpatient Encounter Medications as of 10/19/2018  Medication Sig  . albuterol (PROVENTIL HFA;VENTOLIN HFA) 108 (90 Base) MCG/ACT inhaler Inhale 2 puffs into the lungs every 6 (six) hours as needed for wheezing or shortness of breath.  Marland Kitchen atorvastatin (LIPITOR) 10 MG tablet Take 1 tablet (10 mg total) by mouth daily.  . Cholecalciferol (VITAMIN D3) 1000 units CAPS   . Cyanocobalamin (B-12) 250 MCG TABS Take 250 mcg by mouth daily.  . fluticasone (FLONASE) 50 MCG/ACT nasal spray Place 2 sprays into the nose as needed for rhinitis.  Marland Kitchen lisinopril (PRINIVIL,ZESTRIL) 10 MG tablet One po q day  . loratadine (CLARITIN) 10 MG tablet Take 10 mg by mouth daily.  . vitamin E 400 UNIT capsule Take 400 Units by mouth daily.  . [DISCONTINUED] doxycycline (VIBRAMYCIN) 100 MG capsule Take 1 capsule (100 mg total) by mouth 2 (two) times daily.  . [DISCONTINUED] lisinopril (PRINIVIL,ZESTRIL)  10 MG tablet Take 1 tablet (10 mg total) by mouth daily.   No facility-administered encounter medications on file as of 10/19/2018.     Review of Systems  Constitutional: Negative for appetite change and unexpected weight change.  HENT: Negative for congestion and sinus pressure.   Eyes: Negative for pain and visual disturbance.  Respiratory: Negative for cough, chest tightness and shortness of breath.   Cardiovascular: Negative for chest pain, palpitations and leg swelling.  Gastrointestinal: Negative for abdominal pain, diarrhea, nausea and vomiting.  Genitourinary: Negative for difficulty urinating and dysuria.  Musculoskeletal: Negative for joint swelling and myalgias.  Skin: Negative for color change and rash.  Neurological: Negative for dizziness, light-headedness and headaches.  Hematological: Negative for adenopathy. Does not bruise/bleed easily.  Psychiatric/Behavioral: Negative for agitation and dysphoric mood.       Objective:    Physical Exam Constitutional:      General: She is not in acute distress.    Appearance: Normal appearance. She is well-developed.  HENT:     Nose: Nose normal. No congestion.     Mouth/Throat:     Pharynx: No oropharyngeal exudate or posterior oropharyngeal erythema.  Eyes:     General: No scleral icterus.       Right eye: No discharge.        Left eye: No discharge.  Neck:     Musculoskeletal: Neck supple. No muscular tenderness.     Thyroid: No thyromegaly.  Cardiovascular:     Rate and Rhythm: Normal rate and regular rhythm.  Pulmonary:     Effort: No tachypnea, accessory muscle usage or respiratory distress.     Breath sounds: Normal breath sounds. No decreased breath sounds or wheezing.  Chest:     Breasts:        Right: No inverted nipple, mass, nipple discharge or tenderness (no axillary adenopathy).        Left: No inverted nipple, mass, nipple discharge or tenderness (no axilarry adenopathy).  Abdominal:     General: Bowel  sounds are normal.     Palpations: Abdomen is soft.     Tenderness: There is no abdominal tenderness.  Musculoskeletal:        General: No swelling or tenderness.  Lymphadenopathy:     Cervical: No cervical adenopathy.  Skin:  Findings: No erythema or rash.  Neurological:     Mental Status: She is alert and oriented to person, place, and time.  Psychiatric:        Behavior: Behavior normal.     BP 128/78 (BP Location: Left Arm, Patient Position: Sitting, Cuff Size: Large)   Pulse 87   Temp 98 F (36.7 C) (Oral)   Resp 16   Ht 5' 2"  (1.575 m)   Wt 231 lb 3.2 oz (104.9 kg)   SpO2 97%   BMI 42.29 kg/m  Wt Readings from Last 3 Encounters:  10/19/18 231 lb 3.2 oz (104.9 kg)  06/24/18 225 lb 6 oz (102.2 kg)  02/11/18 217 lb 6.4 oz (98.6 kg)     Lab Results  Component Value Date   WBC 4.7 09/02/2017   HGB 14.5 09/02/2017   HCT 43.7 09/02/2017   PLT 230.0 09/02/2017   GLUCOSE 128 (H) 07/14/2018   CHOL 206 (H) 07/14/2018   TRIG 163.0 (H) 07/14/2018   HDL 50.00 07/14/2018   LDLDIRECT 147.0 12/15/2016   LDLCALC 124 (H) 07/14/2018   ALT 49 (H) 07/14/2018   AST 39 (H) 07/14/2018   NA 136 07/14/2018   K 4.3 07/14/2018   CL 100 07/14/2018   CREATININE 0.91 07/14/2018   BUN 16 07/14/2018   CO2 28 07/14/2018   TSH 2.93 07/14/2018   INR 1.0 07/13/2017   HGBA1C 6.3 07/14/2018   MICROALBUR 0.9 07/09/2017       Assessment & Plan:   Problem List Items Addressed This Visit    Abnormal liver function    S/p liver biopsy.  NASH.  Discussed diet and exercise and weight loss.  Follow liver function tests.        Anemia    Follow cbc.        BMI 40.0-44.9, adult (HCC)    Discussed diet and exercise.  Follow.       Diabetes mellitus without complication (HCC)    Low carb diet and exercise.  Follow met b and a1c.        Relevant Medications   lisinopril (PRINIVIL,ZESTRIL) 10 MG tablet   Other Relevant Orders   Hemoglobin A1c   Health care maintenance     Physical today 10/19/18.  PAP 10/19/18.  mammoram 09/21/17 - birads I.  Schedule f/u mammogram.        Hypercholesteremia    On lipitor.  Low cholesterol diet and exercise.  Follow lipid panel and liver function tests.        Relevant Medications   lisinopril (PRINIVIL,ZESTRIL) 10 MG tablet   Other Relevant Orders   Hepatic function panel   Lipid panel   Hypertension    Blood pressure under good control.  Continue same medication regimen.  Follow pressures.  Follow metabolic panel.        Relevant Medications   lisinopril (PRINIVIL,ZESTRIL) 10 MG tablet   Other Relevant Orders   CBC with Differential/Platelet   Basic metabolic panel   Psoriasis    Followed by dermatology.  Stable.        Other Visit Diagnoses    Routine general medical examination at a health care facility    -  Primary   Breast cancer screening       Relevant Orders   MM 3D SCREEN BREAST BILATERAL   Cervical cancer screening       Relevant Orders   Cytology - PAP( [place]) (Completed)       Einar Pheasant,  MD

## 2018-10-22 LAB — CYTOLOGY - PAP
Diagnosis: NEGATIVE
HPV: NOT DETECTED

## 2018-10-24 ENCOUNTER — Encounter: Payer: Self-pay | Admitting: Internal Medicine

## 2018-10-24 NOTE — Assessment & Plan Note (Signed)
Low carb diet and exercise.  Follow met b and a1c.

## 2018-10-24 NOTE — Assessment & Plan Note (Signed)
Physical today 10/19/18.  PAP 10/19/18.  mammoram 09/21/17 - bir[facility] I.  Schedule f/u mammogram.

## 2018-10-24 NOTE — Assessment & Plan Note (Signed)
Discussed diet and exercise.  Follow.  

## 2018-10-24 NOTE — Assessment & Plan Note (Signed)
Blood pressure under good control.  Continue same medication regimen.  Follow pressures.  Follow metabolic panel.   

## 2018-10-24 NOTE — Assessment & Plan Note (Signed)
S/p liver biopsy.  NASH.  Discussed diet and exercise and weight loss.  Follow liver function tests.

## 2018-10-24 NOTE — Assessment & Plan Note (Signed)
Followed by dermatology.  Stable.

## 2018-10-24 NOTE — Assessment & Plan Note (Signed)
On lipitor.  Low cholesterol diet and exercise.  Follow lipid panel and liver function tests.   

## 2018-10-24 NOTE — Assessment & Plan Note (Signed)
Follow cbc.  

## 2018-10-25 ENCOUNTER — Encounter: Payer: Self-pay | Admitting: Internal Medicine

## 2018-11-02 ENCOUNTER — Other Ambulatory Visit (INDEPENDENT_AMBULATORY_CARE_PROVIDER_SITE_OTHER): Payer: BC Managed Care – PPO

## 2018-11-02 DIAGNOSIS — E7800 Pure hypercholesterolemia, unspecified: Secondary | ICD-10-CM | POA: Diagnosis not present

## 2018-11-02 DIAGNOSIS — E119 Type 2 diabetes mellitus without complications: Secondary | ICD-10-CM | POA: Diagnosis not present

## 2018-11-02 DIAGNOSIS — I10 Essential (primary) hypertension: Secondary | ICD-10-CM

## 2018-11-02 LAB — BASIC METABOLIC PANEL
BUN: 18 mg/dL (ref 6–23)
CO2: 28 mEq/L (ref 19–32)
Calcium: 9.5 mg/dL (ref 8.4–10.5)
Chloride: 101 mEq/L (ref 96–112)
Creatinine, Ser: 0.88 mg/dL (ref 0.40–1.20)
GFR: 66 mL/min (ref >60.00)
Glucose, Bld: 133 mg/dL — ABNORMAL HIGH (ref 70–99)
Potassium: 4.8 mEq/L (ref 3.5–5.1)
Sodium: 138 mEq/L (ref 135–145)

## 2018-11-02 LAB — LIPID PANEL
CHOLESTEROL: 201 mg/dL — AB (ref 0–200)
HDL: 48 mg/dL (ref >39.00)
LDL Cholesterol: 126 mg/dL — ABNORMAL HIGH (ref 0–99)
NonHDL: 152.97
Total CHOL/HDL Ratio: 4
Triglycerides: 134 mg/dL (ref 0.0–149.0)
VLDL: 26.8 mg/dL (ref 0.0–40.0)

## 2018-11-02 LAB — HEPATIC FUNCTION PANEL
ALT: 59 U/L — ABNORMAL HIGH (ref 0–35)
AST: 45 U/L — AB (ref 0–37)
Albumin: 4.4 g/dL (ref 3.5–5.2)
Alkaline Phosphatase: 65 U/L (ref 39–117)
Bilirubin, Direct: 0.1 mg/dL (ref 0.0–0.3)
Total Bilirubin: 0.5 mg/dL (ref 0.2–1.2)
Total Protein: 7.4 g/dL (ref 6.0–8.3)

## 2018-11-02 LAB — CBC WITH DIFFERENTIAL/PLATELET
Basophils Absolute: 0 10*3/uL (ref 0.0–0.1)
Basophils Relative: 1 % (ref 0.0–3.0)
Eosinophils Absolute: 0.1 10*3/uL (ref 0.0–0.7)
Eosinophils Relative: 3.4 % (ref 0.0–5.0)
HCT: 44.3 % (ref 36.0–46.0)
Hemoglobin: 14.7 g/dL (ref 12.0–15.0)
LYMPHS ABS: 1 10*3/uL (ref 0.7–4.0)
Lymphocytes Relative: 28.2 % (ref 12.0–46.0)
MCHC: 33.2 g/dL (ref 30.0–36.0)
MCV: 83.4 fl (ref 78.0–100.0)
Monocytes Absolute: 0.3 10*3/uL (ref 0.1–1.0)
Monocytes Relative: 8.4 % (ref 3.0–12.0)
Neutro Abs: 2.1 10*3/uL (ref 1.4–7.7)
Neutrophils Relative %: 59 % (ref 43.0–77.0)
Platelets: 221 10*3/uL (ref 150.0–400.0)
RBC: 5.31 Mil/uL — ABNORMAL HIGH (ref 3.87–5.11)
RDW: 13.7 % (ref 11.5–15.5)
WBC: 3.5 10*3/uL — ABNORMAL LOW (ref 4.0–10.5)

## 2018-11-02 LAB — HEMOGLOBIN A1C: Hgb A1c MFr Bld: 6.4 % (ref 4.6–6.5)

## 2018-11-04 ENCOUNTER — Inpatient Hospital Stay: Admission: RE | Admit: 2018-11-04 | Payer: BC Managed Care – PPO | Source: Ambulatory Visit

## 2018-11-04 ENCOUNTER — Other Ambulatory Visit: Payer: Self-pay | Admitting: Internal Medicine

## 2018-11-04 DIAGNOSIS — D72819 Decreased white blood cell count, unspecified: Secondary | ICD-10-CM

## 2018-11-04 NOTE — Progress Notes (Signed)
Order placed for f/u cbc.

## 2018-11-23 ENCOUNTER — Ambulatory Visit
Admission: RE | Admit: 2018-11-23 | Discharge: 2018-11-23 | Disposition: A | Discharge status: Home / Self Care | Payer: BC Managed Care – PPO | Source: Ambulatory Visit | Attending: Internal Medicine | Admitting: Internal Medicine

## 2018-11-23 DIAGNOSIS — Z1231 Encounter for screening mammogram for malignant neoplasm of breast: Secondary | ICD-10-CM | POA: Diagnosis not present

## 2018-11-23 DIAGNOSIS — Z1239 Encounter for other screening for malignant neoplasm of breast: Secondary | ICD-10-CM

## 2018-12-17 ENCOUNTER — Other Ambulatory Visit: Payer: BC Managed Care – PPO

## 2018-12-17 ENCOUNTER — Other Ambulatory Visit: Payer: Self-pay

## 2018-12-17 DIAGNOSIS — D72819 Decreased white blood cell count, unspecified: Secondary | ICD-10-CM

## 2018-12-17 LAB — CBC WITH DIFFERENTIAL/PLATELET
Absolute Monocytes: 284 cells/uL (ref 200–950)
Basophils Absolute: 39 cells/uL (ref 0–200)
Basophils Relative: 0.9 %
Eosinophils Absolute: 112 cells/uL (ref 15–500)
Eosinophils Relative: 2.6 %
HCT: 41.7 % (ref 35.0–45.0)
Hemoglobin: 14.3 g/dL (ref 11.7–15.5)
Lymphs Abs: 1402 cells/uL (ref 850–3900)
MCH: 28.3 pg (ref 27.0–33.0)
MCHC: 34.3 g/dL (ref 32.0–36.0)
MCV: 82.6 fL (ref 80.0–100.0)
MPV: 11.8 fL (ref 7.5–12.5)
Monocytes Relative: 6.6 %
Neutro Abs: 2464 cells/uL (ref 1500–7800)
Neutrophils Relative %: 57.3 %
Platelets: 260 10*3/uL (ref 140–400)
RBC: 5.05 10*6/uL (ref 3.80–5.10)
RDW: 12.9 % (ref 11.0–15.0)
Total Lymphocyte: 32.6 %
WBC: 4.3 10*3/uL (ref 3.8–10.8)

## 2018-12-19 ENCOUNTER — Encounter: Payer: Self-pay | Admitting: Internal Medicine

## 2019-01-20 ENCOUNTER — Other Ambulatory Visit: Payer: Self-pay | Admitting: Internal Medicine

## 2019-02-24 ENCOUNTER — Encounter: Payer: Self-pay | Admitting: Internal Medicine

## 2019-02-24 ENCOUNTER — Ambulatory Visit (INDEPENDENT_AMBULATORY_CARE_PROVIDER_SITE_OTHER): Payer: BC Managed Care – PPO | Admitting: Internal Medicine

## 2019-02-24 ENCOUNTER — Other Ambulatory Visit: Payer: Self-pay

## 2019-02-24 DIAGNOSIS — I10 Essential (primary) hypertension: Secondary | ICD-10-CM

## 2019-02-24 DIAGNOSIS — D649 Anemia, unspecified: Secondary | ICD-10-CM

## 2019-02-24 DIAGNOSIS — E119 Type 2 diabetes mellitus without complications: Secondary | ICD-10-CM | POA: Diagnosis not present

## 2019-02-24 DIAGNOSIS — R945 Abnormal results of liver function studies: Secondary | ICD-10-CM

## 2019-02-24 DIAGNOSIS — E7800 Pure hypercholesterolemia, unspecified: Secondary | ICD-10-CM | POA: Diagnosis not present

## 2019-02-24 MED ORDER — LISINOPRIL 20 MG PO TABS: 20.0000 mg | ORAL_TABLET | Freq: Every day | ORAL | 1 refills | Status: DC

## 2019-02-24 NOTE — Progress Notes (Signed)
[name], female   DOB: August 28, 1961, 58 y.o.   MRN: [id number]   Virtual Visit via video Note  This visit type was conducted due to national recommendations for restrictions regarding the COVID-19 pandemic (e.g. social distancing).  This format is felt to be most appropriate for this patient at this time.  All issues noted in this document were discussed and addressed.  No physical exam was performed (except for noted visual exam findings with Video Visits).   I connected with Angie Keagy by a video enabled telemedicine application or telephone and verified that I am speaking with the correct person using two identifiers. Location patient: home Location provider: work  Persons participating in the virtual visit: patient, provider  I discussed the limitations, risks, security and privacy concerns of performing an evaluation and management service by video and the availability of in person appointments. The patient expressed understanding and agreed to proceed.   Reason for visit: scheduled follow up.   HPI: She reports she is doing relatively well.  Increased stress.  Discussed with her today.  Overall she feels she is handling things relatively well.  Does not feel needs any further intervention at this time.  No chest pain.  No sob.  No acid reflux.  No abdominal pain.  Bowels moving.  Sleeping ok.  Blood pressure has been elevated.  States pressures averaging:  Mid 130s-mid 140s/mid to upper 80s-90.  Working from home.  Trying to stayin due to covid restrictions.  No fever.  No chest congestion, cough or sob.  Allergy symptoms controlled with flonase.     ROS: See pertinent positives and negatives per HPI.  Past Medical History:  Diagnosis Date  . Abnormal liver function   . Allergy   . Anemia   . GERD (gastroesophageal reflux disease)   . Heart murmur   . Hypercholesterolemia   . Hyperglycemia   . Hypertension     Past Surgical History:  Procedure Laterality Date   . ABDOMINAL HYSTERECTOMY  2010  . ACHILLES TENDON REPAIR  2009  . BREAST BIOPSY Left 07/30/2006   benign  . BREAST BIOPSY Left 02/18/2007 & 03/05/2007   benign  . BREAST EXCISIONAL BIOPSY    . BREAST SURGERY  2008  . LUMBAR MICRODISCECTOMY  2004  . ROTATOR CUFF REPAIR  2006  . SEPTOPLASTY  1990  . TONSILLECTOMY AND ADENOIDECTOMY  1969    Family History  Problem Relation Age of Onset  . Hyperlipidemia Mother   . Hypertension Mother   . Stroke Mother   . Heart disease Mother   . Hypothyroidism Mother   . Pernicious anemia Mother        aunt x 2  . Hyperlipidemia Maternal Grandmother   . Ulcers Maternal Grandmother   . Lung cancer Father        Hx smoker  . Hypertension Maternal Grandfather        MI  . Heart disease Maternal Grandfather        myocardial infarction  . Kidney cancer Paternal Grandmother   . Ovarian cancer Maternal Aunt   . Breast cancer Paternal Aunt   . Diabetes Other   . Hypothyroidism Other        aunt x 2  . Colon cancer Neg Hx     SOCIAL HX: reviewed.    Current Outpatient Medications:  .  albuterol (PROVENTIL HFA;VENTOLIN HFA) 108 (90 Base) MCG/ACT inhaler, Inhale 2 puffs into the lungs every 6 (six) hours as  needed for wheezing or shortness of breath., Disp: 1 Inhaler, Rfl: 2 .  atorvastatin (LIPITOR) 10 MG tablet, Take 1 tablet (10 mg total) by mouth daily., Disp: 90 tablet, Rfl: 0 .  Cholecalciferol (VITAMIN D3) 1000 units CAPS, , Disp: , Rfl:  .  Cyanocobalamin (B-12) 250 MCG TABS, Take 250 mcg by mouth daily., Disp: , Rfl:  .  fluticasone (FLONASE) 50 MCG/ACT nasal spray, Place 2 sprays into the nose as needed for rhinitis., Disp: , Rfl:  .  lisinopril (ZESTRIL) 20 MG tablet, Take 1 tablet (20 mg total) by mouth daily., Disp: 90 tablet, Rfl: 1 .  loratadine (CLARITIN) 10 MG tablet, Take 10 mg by mouth daily., Disp: , Rfl:  .  vitamin E 400 UNIT capsule, Take 400 Units by mouth daily., Disp: , Rfl:   EXAM:  GENERAL: alert, oriented,  appears well and in no acute distress  HEENT: atraumatic, conjunttiva clear, no obvious abnormalities on inspection of external nose and ears  NECK: normal movements of the head and neck  LUNGS: on inspection no signs of respiratory distress, breathing rate appears normal, no obvious gross SOB, gasping or wheezing  CV: no obvious cyanosis  PSYCH/NEURO: pleasant and cooperative, no obvious depression or anxiety, speech and thought processing grossly intact  ASSESSMENT AND PLAN:  Discussed the following assessment and plan:  Abnormal liver function S/p liver biopsy.  NASH.  Discussed diet, exercise and weight loss.  Follow liver function tests.    Anemia Follow cbc.   Diabetes mellitus without complication (HCC) Low carb diet and exercise.  Follow met b and a1c.    Hypercholesteremia On lipitor.  Low cholesterol diet and exercise.  Follow lipid panel and liver function tests.    Hypertension Blood pressure elevated.  Increase lisinopril to 92m q day.  Follow pressures. Follow metabolic panel.      I discussed the assessment and treatment plan with the patient. The patient was provided an opportunity to ask questions and all were answered. The patient agreed with the plan and demonstrated an understanding of the instructions.   The patient was advised to call back or seek an in-person evaluation if the symptoms worsen or if the condition fails to improve as anticipated.    CEinar Pheasant MD

## 2019-02-28 ENCOUNTER — Encounter: Payer: Self-pay | Admitting: Internal Medicine

## 2019-02-28 NOTE — Assessment & Plan Note (Signed)
Low carb diet and exercise.  Follow met b and a1c.   

## 2019-02-28 NOTE — Assessment & Plan Note (Signed)
Blood pressure elevated.  Increase lisinopril to 30m q day.  Follow pressures. Follow metabolic panel.

## 2019-02-28 NOTE — Assessment & Plan Note (Signed)
S/p liver biopsy.  NASH.  Discussed diet, exercise and weight loss.  Follow liver function tests.

## 2019-02-28 NOTE — Assessment & Plan Note (Signed)
Follow cbc.  

## 2019-02-28 NOTE — Assessment & Plan Note (Signed)
On lipitor.  Low cholesterol diet and exercise.  Follow lipid panel and liver function tests.   

## 2019-03-30 ENCOUNTER — Other Ambulatory Visit: Payer: Self-pay

## 2019-03-30 ENCOUNTER — Other Ambulatory Visit (INDEPENDENT_AMBULATORY_CARE_PROVIDER_SITE_OTHER): Payer: BC Managed Care – PPO

## 2019-03-30 DIAGNOSIS — E7800 Pure hypercholesterolemia, unspecified: Secondary | ICD-10-CM | POA: Diagnosis not present

## 2019-03-30 DIAGNOSIS — R945 Abnormal results of liver function studies: Secondary | ICD-10-CM | POA: Diagnosis not present

## 2019-03-30 DIAGNOSIS — I10 Essential (primary) hypertension: Secondary | ICD-10-CM

## 2019-03-30 DIAGNOSIS — E119 Type 2 diabetes mellitus without complications: Secondary | ICD-10-CM

## 2019-03-30 LAB — BASIC METABOLIC PANEL
BUN: 17 mg/dL (ref 6–23)
CO2: 29 mEq/L (ref 19–32)
Calcium: 9.5 mg/dL (ref 8.4–10.5)
Chloride: 99 mEq/L (ref 96–112)
Creatinine, Ser: 0.88 mg/dL (ref 0.40–1.20)
GFR: 65.91 mL/min (ref >60.00)
Glucose, Bld: 201 mg/dL — ABNORMAL HIGH (ref 70–99)
Potassium: 4.8 mEq/L (ref 3.5–5.1)
Sodium: 139 mEq/L (ref 135–145)

## 2019-03-30 LAB — HEPATIC FUNCTION PANEL
ALT: 119 U/L — ABNORMAL HIGH (ref 0–35)
AST: 87 U/L — ABNORMAL HIGH (ref 0–37)
Albumin: 4.6 g/dL (ref 3.5–5.2)
Alkaline Phosphatase: 78 U/L (ref 39–117)
Bilirubin, Direct: 0.1 mg/dL (ref 0.0–0.3)
Total Bilirubin: 0.5 mg/dL (ref 0.2–1.2)
Total Protein: 7.2 g/dL (ref 6.0–8.3)

## 2019-03-30 LAB — POCT GLYCOSYLATED HEMOGLOBIN (HGB A1C): Hemoglobin A1C: 7.1 % — AB (ref 4.0–5.6)

## 2019-03-30 LAB — LIPID PANEL
Cholesterol: 204 mg/dL — ABNORMAL HIGH (ref 0–200)
HDL: 44.5 mg/dL (ref >39.00)
LDL Cholesterol: 125 mg/dL — ABNORMAL HIGH (ref 0–99)
NonHDL: 159.5
Total CHOL/HDL Ratio: 5
Triglycerides: 174 mg/dL — ABNORMAL HIGH (ref 0.0–149.0)
VLDL: 34.8 mg/dL (ref 0.0–40.0)

## 2019-03-30 LAB — MICROALBUMIN / CREATININE URINE RATIO
Creatinine,U: 48.8 mg/dL
Microalb Creat Ratio: 1.5 mg/g (ref 0.0–30.0)
Microalb, Ur: 0.7 mg/dL (ref 0.0–1.9)

## 2019-03-30 NOTE — Addendum Note (Signed)
Addended by: Leeanne Rio on: 03/30/2019 08:45 AM   Modules accepted: Orders

## 2019-03-31 ENCOUNTER — Ambulatory Visit: Payer: Self-pay

## 2019-03-31 NOTE — Telephone Encounter (Signed)
Incoming call from Patient  requesting lab results.  States that she has an appointment of August 5th. Not sure if it virtual or in person.  Voiced understanding related to Labs.

## 2019-03-31 NOTE — Telephone Encounter (Signed)
Confirmed w/ pt that appt will be virtual on August 5th.

## 2019-04-07 ENCOUNTER — Other Ambulatory Visit: Payer: Self-pay

## 2019-04-07 ENCOUNTER — Ambulatory Visit (INDEPENDENT_AMBULATORY_CARE_PROVIDER_SITE_OTHER): Payer: BC Managed Care – PPO | Admitting: Internal Medicine

## 2019-04-07 DIAGNOSIS — E7800 Pure hypercholesterolemia, unspecified: Secondary | ICD-10-CM

## 2019-04-07 DIAGNOSIS — E119 Type 2 diabetes mellitus without complications: Secondary | ICD-10-CM

## 2019-04-07 DIAGNOSIS — I10 Essential (primary) hypertension: Secondary | ICD-10-CM

## 2019-04-07 DIAGNOSIS — R945 Abnormal results of liver function studies: Secondary | ICD-10-CM

## 2019-04-07 DIAGNOSIS — D649 Anemia, unspecified: Secondary | ICD-10-CM

## 2019-04-09 NOTE — Progress Notes (Signed)
[name], female   DOB: 08/25/1961, 58 y.o.   MRN: [id number]   Virtual Visit via video Note  This visit type was conducted due to national recommendations for restrictions regarding the COVID-19 pandemic (e.g. social distancing).  This format is felt to be most appropriate for this patient at this time.  All issues noted in this document were discussed and addressed.  No physical exam was performed (except for noted visual exam findings with Video Visits).   I connected with Rayhana Slider by a video enabled telemedicine application and verified that I am speaking with the correct person using two identifiers. Location patient: home Location provider: work Persons participating in the virtual visit: patient, provider  I discussed the limitations, risks, security and privacy concerns of performing an evaluation and management service by video and the availability of in person appointments.  The patient expressed understanding and agreed to proceed.   Reason for visit: scheduled follow up.   HPI: She reports she is doing relatively well.  Scheduled appt to discuss recent labs.  a1c increased to 7.1.  Liver function tests - elevated (more).  Discussed low carb diet and exercise.  Discussed weight loss.  Discussed treatment options for her sugar.  Discussed starting metformin.  She is agreeable.  No chest pain.  No sob. No acid reflux.  No abdominal pain.  Bowels moving.  Does report pain left lateral fifth finger.  Brace helping.  Desires no further intervention.  Handling stress.     ROS: See pertinent positives and negatives per HPI.  Past Medical History:  Diagnosis Date  . Abnormal liver function   . Allergy   . Anemia   . GERD (gastroesophageal reflux disease)   . Heart murmur   . Hypercholesterolemia   . Hyperglycemia   . Hypertension     Past Surgical History:  Procedure Laterality Date  . ABDOMINAL HYSTERECTOMY  2010  . ACHILLES TENDON REPAIR  2009  . BREAST  BIOPSY Left 07/30/2006   benign  . BREAST BIOPSY Left 02/18/2007 & 03/05/2007   benign  . BREAST EXCISIONAL BIOPSY    . BREAST SURGERY  2008  . LUMBAR MICRODISCECTOMY  2004  . ROTATOR CUFF REPAIR  2006  . SEPTOPLASTY  1990  . TONSILLECTOMY AND ADENOIDECTOMY  1969    Family History  Problem Relation Age of Onset  . Hyperlipidemia Mother   . Hypertension Mother   . Stroke Mother   . Heart disease Mother   . Hypothyroidism Mother   . Pernicious anemia Mother        aunt x 2  . Hyperlipidemia Maternal Grandmother   . Ulcers Maternal Grandmother   . Lung cancer Father        Hx smoker  . Hypertension Maternal Grandfather        MI  . Heart disease Maternal Grandfather        myocardial infarction  . Kidney cancer Paternal Grandmother   . Ovarian cancer Maternal Aunt   . Breast cancer Paternal Aunt   . Diabetes Other   . Hypothyroidism Other        aunt x 2  . Colon cancer Neg Hx     SOCIAL HX: reviewed.    Current Outpatient Medications:  .  albuterol (PROVENTIL HFA;VENTOLIN HFA) 108 (90 Base) MCG/ACT inhaler, Inhale 2 puffs into the lungs every 6 (six) hours as needed for wheezing or shortness of breath., Disp: 1 Inhaler, Rfl: 2 .  atorvastatin (LIPITOR)  10 MG tablet, Take 1 tablet (10 mg total) by mouth daily., Disp: 90 tablet, Rfl: 0 .  Cholecalciferol (VITAMIN D3) 1000 units CAPS, , Disp: , Rfl:  .  Cyanocobalamin (B-12) 250 MCG TABS, Take 250 mcg by mouth daily., Disp: , Rfl:  .  fluticasone (FLONASE) 50 MCG/ACT nasal spray, Place 2 sprays into the nose as needed for rhinitis., Disp: , Rfl:  .  lisinopril (ZESTRIL) 20 MG tablet, Take 1 tablet (20 mg total) by mouth daily., Disp: 90 tablet, Rfl: 1 .  loratadine (CLARITIN) 10 MG tablet, Take 10 mg by mouth daily., Disp: , Rfl:  .  vitamin E 400 UNIT capsule, Take 400 Units by mouth daily., Disp: , Rfl:   EXAM:  GENERAL: alert, oriented, appears well and in no acute distress  HEENT: atraumatic, conjunttiva clear,  no obvious abnormalities on inspection of external nose and ears  NECK: normal movements of the head and neck  LUNGS: on inspection no signs of respiratory distress, breathing rate appears normal, no obvious gross SOB, gasping or wheezing  CV: no obvious cyanosis  PSYCH/NEURO: pleasant and cooperative, no obvious depression or anxiety, speech and thought processing grossly intact  ASSESSMENT AND PLAN:  Discussed the following assessment and plan:  Abnormal liver function S/p liver biopsy.  NASH.  Discussed diet, exercise and weight loss.  Recent liver function increased.  Follow liver function tests.    Anemia Follow cbc.    Diabetes mellitus without complication (HCC) Low carb diet and exercise.  Discussed elevated a1c - 7.1.  Discussed treatment options.  Start metformin 519m q day. Follow sugars.  Follow metabolic panel.   Hypercholesteremia On lipitor.  Low cholesterol diet and exercise.  Follow lipid panel and liver function tests.    Hypertension Blood pressure has been doing well.  Follow pressures.  Follow metabolic panel.      I discussed the assessment and treatment plan with the patient. The patient was provided an opportunity to ask questions and all were answered. The patient agreed with the plan and demonstrated an understanding of the instructions.   The patient was advised to call back or seek an in-person evaluation if the symptoms worsen or if the condition fails to improve as anticipated.   CEinar Pheasant MD

## 2019-04-10 ENCOUNTER — Encounter: Payer: Self-pay | Admitting: Internal Medicine

## 2019-04-10 NOTE — Assessment & Plan Note (Signed)
On lipitor.  Low cholesterol diet and exercise.  Follow lipid panel and liver function tests.   

## 2019-04-10 NOTE — Assessment & Plan Note (Signed)
Blood pressure has been doing well.  Follow pressures.  Follow metabolic panel.

## 2019-04-10 NOTE — Assessment & Plan Note (Signed)
Follow cbc.  

## 2019-04-10 NOTE — Assessment & Plan Note (Signed)
S/p liver biopsy.  NASH.  Discussed diet, exercise and weight loss.  Recent liver function increased.  Follow liver function tests.

## 2019-04-10 NOTE — Assessment & Plan Note (Signed)
Low carb diet and exercise.  Discussed elevated a1c - 7.1.  Discussed treatment options.  Start metformin 552m q day. Follow sugars.  Follow metabolic panel.

## 2019-04-11 ENCOUNTER — Encounter: Payer: Self-pay | Admitting: Internal Medicine

## 2019-04-11 MED ORDER — METFORMIN HCL 500 MG PO TABS: 500.0000 mg | ORAL_TABLET | Freq: Every day | ORAL | 1 refills | Status: DC

## 2019-04-11 NOTE — Telephone Encounter (Signed)
rx sent in for metormin

## 2019-04-20 ENCOUNTER — Ambulatory Visit: Payer: BC Managed Care – PPO | Admitting: Internal Medicine

## 2019-04-28 ENCOUNTER — Encounter: Payer: Self-pay | Admitting: Internal Medicine

## 2019-04-28 ENCOUNTER — Other Ambulatory Visit (INDEPENDENT_AMBULATORY_CARE_PROVIDER_SITE_OTHER): Payer: BC Managed Care – PPO

## 2019-04-28 ENCOUNTER — Other Ambulatory Visit: Payer: Self-pay

## 2019-04-28 DIAGNOSIS — R945 Abnormal results of liver function studies: Secondary | ICD-10-CM

## 2019-04-28 LAB — HEPATIC FUNCTION PANEL
ALT: 112 U/L — ABNORMAL HIGH (ref 0–35)
AST: 75 U/L — ABNORMAL HIGH (ref 0–37)
Albumin: 4.6 g/dL (ref 3.5–5.2)
Alkaline Phosphatase: 66 U/L (ref 39–117)
Bilirubin, Direct: 0.1 mg/dL (ref 0.0–0.3)
Total Bilirubin: 0.5 mg/dL (ref 0.2–1.2)
Total Protein: 7 g/dL (ref 6.0–8.3)

## 2019-05-10 ENCOUNTER — Encounter: Payer: Self-pay | Admitting: Internal Medicine

## 2019-05-10 ENCOUNTER — Other Ambulatory Visit: Payer: Self-pay | Admitting: Internal Medicine

## 2019-05-10 DIAGNOSIS — R945 Abnormal results of liver function studies: Secondary | ICD-10-CM

## 2019-05-10 DIAGNOSIS — R7989 Other specified abnormal findings of blood chemistry: Secondary | ICD-10-CM

## 2019-05-10 NOTE — Progress Notes (Signed)
Order placed for f/u liver panel.

## 2019-05-10 NOTE — Telephone Encounter (Signed)
Reviewed pts my chart.  Given her liver enzymes had increased and given desires no further evaluation at this time, let her know that I would like to repeat liver panel within the next 1-2 weeks to see if improved.  Agree with diet and weight loss.  Schedule non fasting lab in 1-2 weeks.

## 2019-05-20 LAB — HM DIABETES EYE EXAM

## 2019-06-11 ENCOUNTER — Encounter: Payer: Self-pay | Admitting: Internal Medicine

## 2019-06-16 ENCOUNTER — Encounter: Payer: Self-pay | Admitting: Internal Medicine

## 2019-06-17 ENCOUNTER — Other Ambulatory Visit (INDEPENDENT_AMBULATORY_CARE_PROVIDER_SITE_OTHER): Payer: BC Managed Care – PPO

## 2019-06-17 ENCOUNTER — Other Ambulatory Visit: Payer: Self-pay | Admitting: Internal Medicine

## 2019-06-17 ENCOUNTER — Other Ambulatory Visit: Payer: Self-pay

## 2019-06-17 ENCOUNTER — Ambulatory Visit: Payer: BC Managed Care – PPO

## 2019-06-17 DIAGNOSIS — Z23 Encounter for immunization: Secondary | ICD-10-CM | POA: Diagnosis not present

## 2019-06-17 DIAGNOSIS — R945 Abnormal results of liver function studies: Secondary | ICD-10-CM | POA: Diagnosis not present

## 2019-06-17 DIAGNOSIS — R7989 Other specified abnormal findings of blood chemistry: Secondary | ICD-10-CM

## 2019-06-17 LAB — HEPATIC FUNCTION PANEL
ALT: 122 U/L — ABNORMAL HIGH (ref 0–35)
AST: 74 U/L — ABNORMAL HIGH (ref 0–37)
Albumin: 4.7 g/dL (ref 3.5–5.2)
Alkaline Phosphatase: 71 U/L (ref 39–117)
Bilirubin, Direct: 0.1 mg/dL (ref 0.0–0.3)
Total Bilirubin: 0.5 mg/dL (ref 0.2–1.2)
Total Protein: 7.2 g/dL (ref 6.0–8.3)

## 2019-06-18 ENCOUNTER — Encounter: Payer: Self-pay | Admitting: Internal Medicine

## 2019-07-19 ENCOUNTER — Encounter: Payer: Self-pay | Admitting: Internal Medicine

## 2019-07-19 ENCOUNTER — Ambulatory Visit (INDEPENDENT_AMBULATORY_CARE_PROVIDER_SITE_OTHER): Payer: BC Managed Care – PPO | Admitting: Internal Medicine

## 2019-07-19 ENCOUNTER — Other Ambulatory Visit: Payer: Self-pay

## 2019-07-19 VITALS — BP 126/70 | HR 82 | Temp 97.3°F | Resp 16 | Wt 240.0 lb

## 2019-07-19 DIAGNOSIS — R945 Abnormal results of liver function studies: Secondary | ICD-10-CM

## 2019-07-19 DIAGNOSIS — E119 Type 2 diabetes mellitus without complications: Secondary | ICD-10-CM | POA: Diagnosis not present

## 2019-07-19 DIAGNOSIS — D649 Anemia, unspecified: Secondary | ICD-10-CM

## 2019-07-19 DIAGNOSIS — Z23 Encounter for immunization: Secondary | ICD-10-CM

## 2019-07-19 DIAGNOSIS — I10 Essential (primary) hypertension: Secondary | ICD-10-CM

## 2019-07-19 DIAGNOSIS — E7800 Pure hypercholesterolemia, unspecified: Secondary | ICD-10-CM | POA: Diagnosis not present

## 2019-07-19 NOTE — Progress Notes (Signed)
[name], female   DOB: 02/27/1961, 58 y.o.   MRN: [id number]   Subjective:    [name], female    DOB: [birth year], 58 y.o.   MRN: [id number]  HPI  Patient here for a scheduled follow up.  She reports she is doing relatively well. Handling stress.  Feels things are better.  No chest pain.  No sob.  No acid reflux.  No abdominal pain.  Bowels moving.  Discussed diet and exercise and need for weight loss.  Discussed liver function tests and elevation.  Discussed fatty liver.      Past Medical History:  Diagnosis Date  . Abnormal liver function   . Allergy   . Anemia   . GERD (gastroesophageal reflux disease)   . Heart murmur   . Hypercholesterolemia   . Hyperglycemia   . Hypertension    Past Surgical History:  Procedure Laterality Date  . ABDOMINAL HYSTERECTOMY  2010  . ACHILLES TENDON REPAIR  2009  . BREAST BIOPSY Left 07/30/2006   benign  . BREAST BIOPSY Left 02/18/2007 & 03/05/2007   benign  . BREAST EXCISIONAL BIOPSY    . BREAST SURGERY  2008  . LUMBAR MICRODISCECTOMY  2004  . ROTATOR CUFF REPAIR  2006  . SEPTOPLASTY  1990  . TONSILLECTOMY AND ADENOIDECTOMY  1969   Family History  Problem Relation Age of Onset  . Hyperlipidemia Mother   . Hypertension Mother   . Stroke Mother   . Heart disease Mother   . Hypothyroidism Mother   . Pernicious anemia Mother        aunt x 2  . Hyperlipidemia Maternal Grandmother   . Ulcers Maternal Grandmother   . Lung cancer Father        Hx smoker  . Hypertension Maternal Grandfather        MI  . Heart disease Maternal Grandfather        myocardial infarction  . Kidney cancer Paternal Grandmother   . Ovarian cancer Maternal Aunt   . Breast cancer Paternal Aunt   . Diabetes Other   . Hypothyroidism Other        aunt x 2  . Colon cancer Neg Hx    Social History   Socioeconomic History  . Marital status: Single    Spouse name: Not on file  . Number of children: 0  . Years of education:  Not on file  . Highest education level: Not on file  Occupational History  . Not on file  Social Needs  . Financial resource strain: Not on file  . Food insecurity    Worry: Not on file    Inability: Not on file  . Transportation needs    Medical: Not on file    Non-medical: Not on file  Tobacco Use  . Smoking status: Never Smoker  . Smokeless tobacco: Never Used  Substance and Sexual Activity  . Alcohol use: Yes    Alcohol/week: 0.0 standard drinks    Comment: rare  . Drug use: No  . Sexual activity: Not on file  Lifestyle  . Physical activity    Days per week: Not on file    Minutes per session: Not on file  . Stress: Not on file  Relationships  . Social Herbalist on phone: Not on file    Gets together: Not on file    Attends religious service: Not on file    Active member of  club or organization: Not on file    Attends meetings of clubs or organizations: Not on file    Relationship status: Not on file  Other Topics Concern  . Not on file  Social History Narrative   She is single and has no children. She works at [employer] as an Web designer.     Outpatient Encounter Medications as of 07/19/2019  Medication Sig  . albuterol (PROVENTIL HFA;VENTOLIN HFA) 108 (90 Base) MCG/ACT inhaler Inhale 2 puffs into the lungs every 6 (six) hours as needed for wheezing or shortness of breath.  Marland Kitchen atorvastatin (LIPITOR) 10 MG tablet Take 1 tablet (10 mg total) by mouth daily.  . Cholecalciferol (VITAMIN D3) 1000 units CAPS   . Cyanocobalamin (B-12) 250 MCG TABS Take 250 mcg by mouth daily.  . fluticasone (FLONASE) 50 MCG/ACT nasal spray Place 2 sprays into the nose as needed for rhinitis.  Marland Kitchen lisinopril (ZESTRIL) 20 MG tablet Take 1 tablet (20 mg total) by mouth daily.  Marland Kitchen loratadine (CLARITIN) 10 MG tablet Take 10 mg by mouth daily.  . metFORMIN (GLUCOPHAGE) 500 MG tablet Take 1 tablet (500 mg total) by mouth daily.  . vitamin E 400 UNIT capsule  Take 400 Units by mouth daily.   No facility-administered encounter medications on file as of 07/19/2019.    Review of Systems  Constitutional: Negative for appetite change and unexpected weight change.  HENT: Negative for congestion and sinus pressure.   Respiratory: Negative for cough, chest tightness and shortness of breath.   Cardiovascular: Negative for chest pain, palpitations and leg swelling.  Gastrointestinal: Negative for abdominal pain, diarrhea, nausea and vomiting.  Genitourinary: Negative for difficulty urinating and dysuria.  Musculoskeletal: Negative for joint swelling and myalgias.  Skin: Negative for color change and rash.  Neurological: Negative for dizziness, light-headedness and headaches.  Psychiatric/Behavioral: Negative for agitation and dysphoric mood.       Objective:    Physical Exam Constitutional:      General: She is not in acute distress.    Appearance: Normal appearance.  HENT:     Head: Normocephalic and atraumatic.     Right Ear: External ear normal.     Left Ear: External ear normal.  Eyes:     General: No scleral icterus.       Right eye: No discharge.        Left eye: No discharge.     Conjunctiva/sclera: Conjunctivae normal.  Neck:     Musculoskeletal: Neck supple. No muscular tenderness.     Thyroid: No thyromegaly.  Cardiovascular:     Rate and Rhythm: Normal rate and regular rhythm.  Pulmonary:     Effort: No respiratory distress.     Breath sounds: Normal breath sounds. No wheezing.  Abdominal:     General: Bowel sounds are normal.     Palpations: Abdomen is soft.     Tenderness: There is no abdominal tenderness.  Musculoskeletal:        General: No tenderness.  Lymphadenopathy:     Cervical: No cervical adenopathy.  Skin:    Findings: No erythema or rash.  Neurological:     Mental Status: She is alert.  Psychiatric:        Mood and Affect: Mood normal.        Behavior: Behavior normal.     BP 126/70   Pulse 82    Temp (!) 97.3 F (36.3 C)   Resp 16   Wt 240 lb (108.9 kg)  SpO2 98%   BMI 43.90 kg/m  Wt Readings from Last 3 Encounters:  07/19/19 240 lb (108.9 kg)  10/19/18 231 lb 3.2 oz (104.9 kg)  06/24/18 225 lb 6 oz (102.2 kg)     Lab Results  Component Value Date   WBC 4.3 12/17/2018   HGB 14.3 12/17/2018   HCT 41.7 12/17/2018   PLT 260 12/17/2018   GLUCOSE 201 (H) 03/30/2019   CHOL 204 (H) 03/30/2019   TRIG 174.0 (H) 03/30/2019   HDL 44.50 03/30/2019   LDLDIRECT 147.0 12/15/2016   LDLCALC 125 (H) 03/30/2019   ALT 122 (H) 06/17/2019   AST 74 (H) 06/17/2019   NA 139 03/30/2019   K 4.8 03/30/2019   CL 99 03/30/2019   CREATININE 0.88 03/30/2019   BUN 17 03/30/2019   CO2 29 03/30/2019   TSH 2.93 07/14/2018   INR 1.0 07/13/2017   HGBA1C 7.1 (A) 03/30/2019   MICROALBUR 0.7 03/30/2019    Mm 3d Screen Breast Bilateral  Result Date: 11/24/2018 CLINICAL DATA:  Screening. EXAM: DIGITAL SCREENING BILATERAL MAMMOGRAM WITH TOMO AND CAD COMPARISON:  Previous exam(s). ACR Breast Density Category a: The breast tissue is almost entirely fatty. FINDINGS: There are no findings suspicious for malignancy. Images were processed with CAD. IMPRESSION: No mammographic evidence of malignancy. A result letter of this screening mammogram will be mailed directly to the patient. RECOMMENDATION: Screening mammogram in one year. (Code:SM-B-01Y) BI-RADS CATEGORY  1: Negative. Electronically Signed   By: Margarette Canada M.D.   On: 11/24/2018 12:19       Assessment & Plan:   Problem List Items Addressed This Visit    Abnormal liver function    S/p liver biopsy.  NASH.  Discussed diet, exercise and weight loss.  Follow liver function tests.  Discussed referral back to GI.       Relevant Orders   Hepatic function panel   Anemia    Follow cbc.        Diabetes mellitus without complication (HCC)    Low carb diet and exercise.  Follow met b and a1c.        Relevant Orders   Hemoglobin I4P   Basic  metabolic panel (future)   Hypercholesteremia    On lipitor.  Low cholesterol diet and exercise.  Follow lipid panel and liver function tests.        Relevant Orders   Lipid panel   Hypertension    Blood pressure under good control.  Continue same medication regimen.  Follow pressures.  Follow metabolic panel.        Relevant Orders   TSH    Other Visit Diagnoses    Need for 23-polyvalent pneumococcal polysaccharide vaccine    -  Primary   Relevant Orders   Pneumococcal polysaccharide vaccine 23-valent greater than or equal to 2yo subcutaneous/IM (Completed)       Einar Pheasant, MD

## 2019-07-24 ENCOUNTER — Encounter: Payer: Self-pay | Admitting: Internal Medicine

## 2019-07-24 NOTE — Assessment & Plan Note (Signed)
Blood pressure under good control.  Continue same medication regimen.  Follow pressures.  Follow metabolic panel.   

## 2019-07-24 NOTE — Assessment & Plan Note (Signed)
S/p liver biopsy.  NASH.  Discussed diet, exercise and weight loss.  Follow liver function tests.  Discussed referral back to GI.

## 2019-07-24 NOTE — Assessment & Plan Note (Signed)
Low carb diet and exercise.  Follow met b and a1c.   

## 2019-07-24 NOTE — Assessment & Plan Note (Signed)
On lipitor.  Low cholesterol diet and exercise.  Follow lipid panel and liver function tests.   

## 2019-07-24 NOTE — Assessment & Plan Note (Signed)
Follow cbc.  

## 2019-08-02 ENCOUNTER — Other Ambulatory Visit: Payer: Self-pay | Admitting: Internal Medicine

## 2019-10-12 ENCOUNTER — Other Ambulatory Visit: Payer: Self-pay | Admitting: Internal Medicine

## 2019-11-15 ENCOUNTER — Other Ambulatory Visit (INDEPENDENT_AMBULATORY_CARE_PROVIDER_SITE_OTHER): Payer: BC Managed Care – PPO

## 2019-11-15 ENCOUNTER — Other Ambulatory Visit: Payer: Self-pay

## 2019-11-15 DIAGNOSIS — E7800 Pure hypercholesterolemia, unspecified: Secondary | ICD-10-CM

## 2019-11-15 DIAGNOSIS — R945 Abnormal results of liver function studies: Secondary | ICD-10-CM | POA: Diagnosis not present

## 2019-11-15 DIAGNOSIS — I10 Essential (primary) hypertension: Secondary | ICD-10-CM

## 2019-11-15 DIAGNOSIS — E119 Type 2 diabetes mellitus without complications: Secondary | ICD-10-CM | POA: Diagnosis not present

## 2019-11-15 LAB — BASIC METABOLIC PANEL
BUN: 18 mg/dL (ref 6–23)
CO2: 26 mEq/L (ref 19–32)
Calcium: 9.6 mg/dL (ref 8.4–10.5)
Chloride: 100 mEq/L (ref 96–112)
Creatinine, Ser: 0.83 mg/dL (ref 0.40–1.20)
GFR: 70.36 mL/min (ref >60.00)
Glucose, Bld: 138 mg/dL — ABNORMAL HIGH (ref 70–99)
Potassium: 4.2 mEq/L (ref 3.5–5.1)
Sodium: 136 mEq/L (ref 135–145)

## 2019-11-15 LAB — HEPATIC FUNCTION PANEL
ALT: 134 U/L — ABNORMAL HIGH (ref 0–35)
AST: 88 U/L — ABNORMAL HIGH (ref 0–37)
Albumin: 4.2 g/dL (ref 3.5–5.2)
Alkaline Phosphatase: 73 U/L (ref 39–117)
Bilirubin, Direct: 0.1 mg/dL (ref 0.0–0.3)
Total Bilirubin: 0.4 mg/dL (ref 0.2–1.2)
Total Protein: 7.3 g/dL (ref 6.0–8.3)

## 2019-11-15 LAB — TSH: TSH: 3.22 u[IU]/mL (ref 0.35–4.50)

## 2019-11-15 LAB — LIPID PANEL
Cholesterol: 213 mg/dL — ABNORMAL HIGH (ref 0–200)
HDL: 43.5 mg/dL (ref >39.00)
LDL Cholesterol: 133 mg/dL — ABNORMAL HIGH (ref 0–99)
NonHDL: 169.21
Total CHOL/HDL Ratio: 5
Triglycerides: 180 mg/dL — ABNORMAL HIGH (ref 0.0–149.0)
VLDL: 36 mg/dL (ref 0.0–40.0)

## 2019-11-15 LAB — HEMOGLOBIN A1C: Hgb A1c MFr Bld: 6.6 % — ABNORMAL HIGH (ref 4.6–6.5)

## 2019-11-17 ENCOUNTER — Ambulatory Visit (INDEPENDENT_AMBULATORY_CARE_PROVIDER_SITE_OTHER): Payer: BC Managed Care – PPO | Admitting: Internal Medicine

## 2019-11-17 ENCOUNTER — Other Ambulatory Visit: Payer: Self-pay

## 2019-11-17 ENCOUNTER — Encounter: Payer: Self-pay | Admitting: Internal Medicine

## 2019-11-17 VITALS — BP 140/86 | HR 71 | Temp 97.3°F | Ht 62.0 in | Wt 239.2 lb

## 2019-11-17 DIAGNOSIS — D649 Anemia, unspecified: Secondary | ICD-10-CM | POA: Diagnosis not present

## 2019-11-17 DIAGNOSIS — M25561 Pain in right knee: Secondary | ICD-10-CM

## 2019-11-17 DIAGNOSIS — R945 Abnormal results of liver function studies: Secondary | ICD-10-CM | POA: Diagnosis not present

## 2019-11-17 DIAGNOSIS — Z1231 Encounter for screening mammogram for malignant neoplasm of breast: Secondary | ICD-10-CM

## 2019-11-17 DIAGNOSIS — Z0000 Encounter for general adult medical examination without abnormal findings: Secondary | ICD-10-CM | POA: Diagnosis not present

## 2019-11-17 DIAGNOSIS — Z6841 Body Mass Index (BMI) 40.0 and over, adult: Secondary | ICD-10-CM

## 2019-11-17 DIAGNOSIS — E7800 Pure hypercholesterolemia, unspecified: Secondary | ICD-10-CM

## 2019-11-17 DIAGNOSIS — N281 Cyst of kidney, acquired: Secondary | ICD-10-CM

## 2019-11-17 DIAGNOSIS — I10 Essential (primary) hypertension: Secondary | ICD-10-CM

## 2019-11-17 DIAGNOSIS — E119 Type 2 diabetes mellitus without complications: Secondary | ICD-10-CM

## 2019-11-17 NOTE — Progress Notes (Addendum)
[name], female   DOB: 1961/02/15, 59 y.o.   MRN: [id number]   Subjective:    [name], female    DOB: Sep 23, 1960, 59 y.o.   MRN: [id number]  HPI  Patient here for her physical exam.  She reports some increased stress recently.  Stress with where she is living.  Discussed with her today.  Discussed counseling.  Tries to stay active.  Discussed recent labs and elevated liver function tests.  Discussed referral back to GI.  Discussed diet, exercise and weight loss.  No chest pain or sob reported.  No abdominal pain or bowel change reported.  Having problems with her right knee - frontal region.  No injury.  Started before Christmas.  Walking a lot - aching.  Did take some ibuprofen and naprosyn.  Helped some.  Given persistent pain, discussed referral to ortho.    Past Medical History:  Diagnosis Date  . Abnormal liver function   . Allergy   . Anemia   . GERD (gastroesophageal reflux disease)   . Heart murmur   . Hypercholesterolemia   . Hyperglycemia   . Hypertension    Past Surgical History:  Procedure Laterality Date  . ABDOMINAL HYSTERECTOMY  2010  . ACHILLES TENDON REPAIR  2009  . BREAST BIOPSY Left 07/30/2006   benign  . BREAST BIOPSY Left 02/18/2007 & 03/05/2007   benign  . BREAST EXCISIONAL BIOPSY    . BREAST SURGERY  2008  . LUMBAR MICRODISCECTOMY  2004  . ROTATOR CUFF REPAIR  2006  . SEPTOPLASTY  1990  . TONSILLECTOMY AND ADENOIDECTOMY  1969   Family History  Problem Relation Age of Onset  . Hyperlipidemia Mother   . Hypertension Mother   . Stroke Mother   . Heart disease Mother   . Hypothyroidism Mother   . Pernicious anemia Mother        aunt x 2  . Hyperlipidemia Maternal Grandmother   . Ulcers Maternal Grandmother   . Lung cancer Father        Hx smoker  . Hypertension Maternal Grandfather        MI  . Heart disease Maternal Grandfather        myocardial infarction  . Kidney cancer Paternal Grandmother   . Ovarian  cancer Maternal Aunt   . Breast cancer Paternal Aunt   . Diabetes Other   . Hypothyroidism Other        aunt x 2  . Colon cancer Neg Hx    Social History   Socioeconomic History  . Marital status: Single    Spouse name: Not on file  . Number of children: 0  . Years of education: Not on file  . Highest education level: Not on file  Occupational History  . Not on file  Tobacco Use  . Smoking status: Never Smoker  . Smokeless tobacco: Never Used  Substance and Sexual Activity  . Alcohol use: Yes    Alcohol/week: 0.0 standard drinks    Comment: rare  . Drug use: No  . Sexual activity: Not on file  Other Topics Concern  . Not on file  Social History Narrative   She is single and has no children. She works at [employer] as an Web designer.    Social Determinants of Health   Financial Resource Strain:   . Difficulty of Paying Living Expenses:   Food Insecurity:   . Worried About Charity fundraiser in the  Last Year:   . [place] in the Last Year:   Transportation Needs:   . Film/video editor (Medical):   Marland Kitchen Lack of Transportation (Non-Medical):   Physical Activity:   . Days of Exercise per Week:   . Minutes of Exercise per Session:   Stress:   . Feeling of Stress :   Social Connections:   . Frequency of Communication with Friends and Family:   . Frequency of Social Gatherings with Friends and Family:   . Attends Religious Services:   . Active Member of Clubs or Organizations:   . Attends Archivist Meetings:   Marland Kitchen Marital Status:     Outpatient Encounter Medications as of 11/17/2019  Medication Sig  . albuterol (PROVENTIL HFA;VENTOLIN HFA) 108 (90 Base) MCG/ACT inhaler Inhale 2 puffs into the lungs every 6 (six) hours as needed for wheezing or shortness of breath.  Marland Kitchen atorvastatin (LIPITOR) 10 MG tablet Take 1 tablet (10 mg total) by mouth daily.  . Cholecalciferol (VITAMIN D3) 1000 units CAPS   . Cyanocobalamin (B-12) 250  MCG TABS Take 250 mcg by mouth daily.  . fluticasone (FLONASE) 50 MCG/ACT nasal spray Place 2 sprays into the nose as needed for rhinitis.  Marland Kitchen lisinopril (ZESTRIL) 20 MG tablet Take 1 tablet (20 mg total) by mouth daily.  Marland Kitchen loratadine (CLARITIN) 10 MG tablet Take 10 mg by mouth daily.  . metFORMIN (GLUCOPHAGE) 500 MG tablet Take 1 tablet (500 mg total) by mouth daily.  . vitamin E 400 UNIT capsule Take 400 Units by mouth daily.   No facility-administered encounter medications on file as of 11/17/2019.   Review of Systems  Constitutional: Negative for appetite change and unexpected weight change.  HENT: Negative for congestion and sinus pressure.   Eyes: Negative for pain and visual disturbance.  Respiratory: Negative for cough, chest tightness and shortness of breath.   Cardiovascular: Negative for chest pain, palpitations and leg swelling.  Gastrointestinal: Negative for abdominal pain, diarrhea, nausea and vomiting.  Genitourinary: Negative for difficulty urinating and dysuria.  Musculoskeletal: Negative for joint swelling and myalgias.       Right knee pain as outlined.    Skin: Negative for color change and rash.  Neurological: Negative for dizziness, light-headedness and headaches.  Hematological: Negative for adenopathy. Does not bruise/bleed easily.  Psychiatric/Behavioral: Negative for agitation and dysphoric mood.       Objective:    Physical Exam Constitutional:      General: She is not in acute distress.    Appearance: Normal appearance. She is well-developed.  HENT:     Head: Normocephalic and atraumatic.     Right Ear: External ear normal.     Left Ear: External ear normal.  Eyes:     General: No scleral icterus.       Right eye: No discharge.        Left eye: No discharge.     Conjunctiva/sclera: Conjunctivae normal.  Neck:     Thyroid: No thyromegaly.  Cardiovascular:     Rate and Rhythm: Normal rate and regular rhythm.  Pulmonary:     Effort: No tachypnea,  accessory muscle usage or respiratory distress.     Breath sounds: Normal breath sounds. No decreased breath sounds or wheezing.  Chest:     Breasts:        Right: No inverted nipple, mass, nipple discharge or tenderness (no axillary adenopathy).        Left: No inverted nipple, mass,  nipple discharge or tenderness (no axilarry adenopathy).  Abdominal:     General: Bowel sounds are normal.     Palpations: Abdomen is soft.     Tenderness: There is no abdominal tenderness.  Musculoskeletal:        General: No swelling or tenderness.     Cervical back: Neck supple. No tenderness.  Lymphadenopathy:     Cervical: No cervical adenopathy.  Skin:    Findings: No erythema or rash.  Neurological:     Mental Status: She is alert and oriented to person, place, and time.  Psychiatric:        Mood and Affect: Mood normal.        Behavior: Behavior normal.     BP 140/86   Pulse 71   Temp (!) 97.3 F (36.3 C) (Temporal)   Ht 5' 2"  (1.575 m)   Wt 239 lb 3.2 oz (108.5 kg)   SpO2 99%   BMI 43.75 kg/m  Wt Readings from Last 3 Encounters:  11/17/19 239 lb 3.2 oz (108.5 kg)  07/19/19 240 lb (108.9 kg)  10/19/18 231 lb 3.2 oz (104.9 kg)     Lab Results  Component Value Date   WBC 4.3 12/17/2018   HGB 14.3 12/17/2018   HCT 41.7 12/17/2018   PLT 260 12/17/2018   GLUCOSE 138 (H) 11/15/2019   CHOL 213 (H) 11/15/2019   TRIG 180.0 (H) 11/15/2019   HDL 43.50 11/15/2019   LDLDIRECT 147.0 12/15/2016   LDLCALC 133 (H) 11/15/2019   ALT 134 (H) 11/15/2019   AST 88 (H) 11/15/2019   NA 136 11/15/2019   K 4.2 11/15/2019   CL 100 11/15/2019   CREATININE 0.83 11/15/2019   BUN 18 11/15/2019   CO2 26 11/15/2019   TSH 3.22 11/15/2019   INR 1.0 07/13/2017   HGBA1C 6.6 (H) 11/15/2019   MICROALBUR 0.7 03/30/2019    MM 3D SCREEN BREAST BILATERAL  Result Date: 11/24/2018 CLINICAL DATA:  Screening. EXAM: DIGITAL SCREENING BILATERAL MAMMOGRAM WITH TOMO AND CAD COMPARISON:  Previous exam(s). ACR  Breast Density Category a: The breast tissue is almost entirely fatty. FINDINGS: There are no findings suspicious for malignancy. Images were processed with CAD. IMPRESSION: No mammographic evidence of malignancy. A result letter of this screening mammogram will be mailed directly to the patient. RECOMMENDATION: Screening mammogram in one year. (Code:SM-B-01Y) BI-RADS CATEGORY  1: Negative. Electronically Signed   By: Margarette Canada M.D.   On: 11/24/2018 12:19       Assessment & Plan:   Problem List Items Addressed This Visit    Abnormal liver function    S/p liver biopsy.  NASH.  Diet, exercise and weight loss.  Follow liver function tests.  Discussed referral back to GI.  She is in agreement.       Relevant Orders   Ambulatory referral to Gastroenterology   Hepatic function panel   Anemia    Follow cbc.       Relevant Orders   CBC with Differential/Platelet   BMI 40.0-44.9, adult (HCC)    Discussed diet, exercise and weight loss.  Follow.        Diabetes mellitus without complication (HCC)    Low carb diet and exercise.  On no medication.  Follow met b and a1c.        Relevant Orders   Hemoglobin Y6V   Basic metabolic panel   Health care maintenance    Physical 11/17/19.  PAP 10/19/18.  Mammogram 11/24/18 - Birads I.  Schedule f/u mammogram.        Hypercholesteremia    On lipitor.  Low cholesterol diet and exercise.  Follow lipid panel and liver function tests.        Relevant Orders   Lipid panel   Hypertension    Blood pressure under good control.  Continue same medication regimen. Continues on lisinopril.  Follow pressures.  Follow metabolic panel.        Renal cyst    Right renal cyst noted on previous ultrasound.  Ultrasound [facility] - read as benign.  F/u ultrasound [facility] - mentioned cyst.  D/w pt regarding f/u ultrasound to confirm no change.        Right knee pain    Persistent pain right knee.  Refer to ortho.        Relevant Orders   Ambulatory referral to  Orthopedic Surgery    Other Visit Diagnoses    Routine general medical examination at a health care facility    -  Primary   Encounter for screening mammogram for malignant neoplasm of breast       Relevant Orders   MM 3D SCREEN BREAST BILATERAL      Einar Pheasant, MD

## 2019-11-17 NOTE — Assessment & Plan Note (Addendum)
Physical 11/17/19.  PAP 10/19/18.  Mammogram 11/24/18 - Birads I.  Schedule f/u mammogram.

## 2019-11-18 ENCOUNTER — Telehealth: Payer: Self-pay | Admitting: Internal Medicine

## 2019-11-18 NOTE — Telephone Encounter (Signed)
Opened in error

## 2019-11-25 ENCOUNTER — Ambulatory Visit
Admission: RE | Admit: 2019-11-25 | Discharge: 2019-11-25 | Disposition: A | Discharge status: Home / Self Care | Payer: BC Managed Care – PPO | Source: Ambulatory Visit | Attending: Internal Medicine | Admitting: Internal Medicine

## 2019-11-25 DIAGNOSIS — Z1231 Encounter for screening mammogram for malignant neoplasm of breast: Secondary | ICD-10-CM | POA: Insufficient documentation

## 2019-11-26 ENCOUNTER — Other Ambulatory Visit: Payer: Self-pay | Admitting: Internal Medicine

## 2019-11-26 DIAGNOSIS — M25561 Pain in right knee: Secondary | ICD-10-CM | POA: Insufficient documentation

## 2019-11-26 NOTE — Assessment & Plan Note (Signed)
Blood pressure under good control.  Continue same medication regimen. Continues on lisinopril.  Follow pressures.  Follow metabolic panel.

## 2019-11-26 NOTE — Assessment & Plan Note (Signed)
Low carb diet and exercise.  On no medication.  Follow met b and a1c.   

## 2019-11-26 NOTE — Assessment & Plan Note (Signed)
S/p liver biopsy.  NASH.  Diet, exercise and weight loss.  Follow liver function tests.  Discussed referral back to GI.  She is in agreement.

## 2019-11-26 NOTE — Assessment & Plan Note (Signed)
Follow cbc.  

## 2019-11-26 NOTE — Assessment & Plan Note (Signed)
Persistent pain right knee.  Refer to ortho.

## 2019-11-26 NOTE — Assessment & Plan Note (Signed)
On lipitor.  Low cholesterol diet and exercise.  Follow lipid panel and liver function tests.   

## 2019-11-26 NOTE — Assessment & Plan Note (Signed)
Right renal cyst noted on previous ultrasound.  Ultrasound [facility] - read as benign.  F/u ultrasound [facility] - mentioned cyst.  D/w pt regarding f/u ultrasound to confirm no change.

## 2019-11-26 NOTE — Assessment & Plan Note (Signed)
Discussed diet, exercise and weight loss.  Follow.    

## 2019-11-26 NOTE — Addendum Note (Signed)
Addended by: Alisa Graff on: 11/26/2019 03:31 PM   Modules accepted: Orders

## 2019-12-02 DIAGNOSIS — M179 Osteoarthritis of knee, unspecified: Secondary | ICD-10-CM | POA: Insufficient documentation

## 2020-01-18 ENCOUNTER — Other Ambulatory Visit: Payer: Self-pay | Admitting: Gastroenterology

## 2020-01-18 DIAGNOSIS — K7581 Nonalcoholic steatohepatitis (NASH): Secondary | ICD-10-CM

## 2020-01-23 ENCOUNTER — Other Ambulatory Visit: Payer: Self-pay | Admitting: Internal Medicine

## 2020-01-24 ENCOUNTER — Other Ambulatory Visit: Payer: Self-pay

## 2020-01-24 ENCOUNTER — Ambulatory Visit
Admission: RE | Admit: 2020-01-24 | Discharge: 2020-01-24 | Disposition: A | Discharge status: Home / Self Care | Payer: BC Managed Care – PPO | Source: Ambulatory Visit | Attending: Gastroenterology | Admitting: Gastroenterology

## 2020-01-24 DIAGNOSIS — K7581 Nonalcoholic steatohepatitis (NASH): Secondary | ICD-10-CM | POA: Insufficient documentation

## 2020-01-31 ENCOUNTER — Encounter: Payer: Self-pay | Admitting: Internal Medicine

## 2020-02-15 ENCOUNTER — Other Ambulatory Visit (INDEPENDENT_AMBULATORY_CARE_PROVIDER_SITE_OTHER): Payer: BC Managed Care – PPO

## 2020-02-15 ENCOUNTER — Other Ambulatory Visit: Payer: Self-pay

## 2020-02-15 DIAGNOSIS — D649 Anemia, unspecified: Secondary | ICD-10-CM | POA: Diagnosis not present

## 2020-02-15 DIAGNOSIS — E119 Type 2 diabetes mellitus without complications: Secondary | ICD-10-CM

## 2020-02-15 DIAGNOSIS — R945 Abnormal results of liver function studies: Secondary | ICD-10-CM

## 2020-02-15 DIAGNOSIS — E7800 Pure hypercholesterolemia, unspecified: Secondary | ICD-10-CM

## 2020-02-15 LAB — CBC WITH DIFFERENTIAL/PLATELET
Basophils Absolute: 0 10*3/uL (ref 0.0–0.1)
Basophils Relative: 0.8 % (ref 0.0–3.0)
Eosinophils Absolute: 0.1 10*3/uL (ref 0.0–0.7)
Eosinophils Relative: 3 % (ref 0.0–5.0)
HCT: 41.6 % (ref 36.0–46.0)
Hemoglobin: 14.1 g/dL (ref 12.0–15.0)
Lymphocytes Relative: 26.8 % (ref 12.0–46.0)
Lymphs Abs: 0.9 10*3/uL (ref 0.7–4.0)
MCHC: 34 g/dL (ref 30.0–36.0)
MCV: 84 fl (ref 78.0–100.0)
Monocytes Absolute: 0.3 10*3/uL (ref 0.1–1.0)
Monocytes Relative: 7.6 % (ref 3.0–12.0)
Neutro Abs: 2.1 10*3/uL (ref 1.4–7.7)
Neutrophils Relative %: 61.8 % (ref 43.0–77.0)
Platelets: 177 10*3/uL (ref 150.0–400.0)
RBC: 4.96 Mil/uL (ref 3.87–5.11)
RDW: 13.4 % (ref 11.5–15.5)
WBC: 3.4 10*3/uL — ABNORMAL LOW (ref 4.0–10.5)

## 2020-02-15 LAB — BASIC METABOLIC PANEL
BUN: 16 mg/dL (ref 6–23)
CO2: 29 mEq/L (ref 19–32)
Calcium: 9.5 mg/dL (ref 8.4–10.5)
Chloride: 98 mEq/L (ref 96–112)
Creatinine, Ser: 0.84 mg/dL (ref 0.40–1.20)
GFR: 69.34 mL/min (ref >60.00)
Glucose, Bld: 199 mg/dL — ABNORMAL HIGH (ref 70–99)
Potassium: 4.4 mEq/L (ref 3.5–5.1)
Sodium: 134 mEq/L — ABNORMAL LOW (ref 135–145)

## 2020-02-15 LAB — LIPID PANEL
Cholesterol: 236 mg/dL — ABNORMAL HIGH (ref 0–200)
HDL: 39.3 mg/dL (ref >39.00)
NonHDL: 196.33
Total CHOL/HDL Ratio: 6
Triglycerides: 205 mg/dL — ABNORMAL HIGH (ref 0.0–149.0)
VLDL: 41 mg/dL — ABNORMAL HIGH (ref 0.0–40.0)

## 2020-02-15 LAB — HEPATIC FUNCTION PANEL
ALT: 151 U/L — ABNORMAL HIGH (ref 0–35)
AST: 127 U/L — ABNORMAL HIGH (ref 0–37)
Albumin: 4.4 g/dL (ref 3.5–5.2)
Alkaline Phosphatase: 73 U/L (ref 39–117)
Bilirubin, Direct: 0.1 mg/dL (ref 0.0–0.3)
Total Bilirubin: 0.5 mg/dL (ref 0.2–1.2)
Total Protein: 7.2 g/dL (ref 6.0–8.3)

## 2020-02-15 LAB — HEMOGLOBIN A1C: Hgb A1c MFr Bld: 7.4 % — ABNORMAL HIGH (ref 4.6–6.5)

## 2020-02-15 LAB — LDL CHOLESTEROL, DIRECT: Direct LDL: 167 mg/dL

## 2020-02-17 ENCOUNTER — Other Ambulatory Visit: Payer: Self-pay

## 2020-02-17 ENCOUNTER — Encounter: Payer: Self-pay | Admitting: Internal Medicine

## 2020-02-17 ENCOUNTER — Ambulatory Visit (INDEPENDENT_AMBULATORY_CARE_PROVIDER_SITE_OTHER): Payer: BC Managed Care – PPO | Admitting: Internal Medicine

## 2020-02-17 DIAGNOSIS — E7800 Pure hypercholesterolemia, unspecified: Secondary | ICD-10-CM | POA: Diagnosis not present

## 2020-02-17 DIAGNOSIS — N281 Cyst of kidney, acquired: Secondary | ICD-10-CM | POA: Diagnosis not present

## 2020-02-17 DIAGNOSIS — Z6841 Body Mass Index (BMI) 40.0 and over, adult: Secondary | ICD-10-CM

## 2020-02-17 DIAGNOSIS — R945 Abnormal results of liver function studies: Secondary | ICD-10-CM

## 2020-02-17 DIAGNOSIS — I10 Essential (primary) hypertension: Secondary | ICD-10-CM | POA: Diagnosis not present

## 2020-02-17 DIAGNOSIS — D649 Anemia, unspecified: Secondary | ICD-10-CM

## 2020-02-17 DIAGNOSIS — E119 Type 2 diabetes mellitus without complications: Secondary | ICD-10-CM | POA: Diagnosis not present

## 2020-02-17 MED ORDER — METFORMIN HCL 500 MG PO TABS: 500.0000 mg | ORAL_TABLET | Freq: Two times a day (BID) | ORAL | 3 refills | Status: DC

## 2020-02-17 NOTE — Progress Notes (Signed)
[name], female   DOB: 31-Oct-1960, 59 y.o.   MRN: [id number]   Subjective:    [name], female    DOB: 12/26/1960, 60 y.o.   MRN: [id number]  HPI This visit occurred during the SARS-CoV-2 public health emergency.  Safety protocols were in place, including screening questions prior to the visit, additional usage of staff PPE, and extensive cleaning of exam room while observing appropriate contact time as indicated for disinfecting solutions.  Patient here for a scheduled follow up.  Here to follow up regarding her abnormal liver function tests, fatty liver, elevated cholesterol and blood pressure.  Sees GI for NASH.  Recent ultrasound.  Reviewed results with pt - hepatic steatosis - no focal hepatic lesion.  Discussed finding of 3.3cm interpolar right renal cyst - mildly increased in size.  Discussed recent labs.  Liver function tests elevated.  Triglycerides and sugar elevated.  Discussed diet and exercise.  No chest pain or sob reported.  No abdominal pain or bowel change reported.  GI discussed colon screening.  Per note, planning for cologuard.    Past Medical History:  Diagnosis Date  . Abnormal liver function   . Allergy   . Anemia   . GERD (gastroesophageal reflux disease)   . Heart murmur   . Hypercholesterolemia   . Hyperglycemia   . Hypertension    Past Surgical History:  Procedure Laterality Date  . ABDOMINAL HYSTERECTOMY  2010  . ACHILLES TENDON REPAIR  2009  . BREAST BIOPSY Left 07/30/2006   benign  . BREAST BIOPSY Left 02/18/2007 & 03/05/2007   benign  . BREAST EXCISIONAL BIOPSY    . BREAST SURGERY  2008  . LUMBAR MICRODISCECTOMY  2004  . ROTATOR CUFF REPAIR  2006  . SEPTOPLASTY  1990  . TONSILLECTOMY AND ADENOIDECTOMY  1969   Family History  Problem Relation Age of Onset  . Hyperlipidemia Mother   . Hypertension Mother   . Stroke Mother   . Heart disease Mother   . Hypothyroidism Mother   . Pernicious anemia Mother    aunt x 2  . Hyperlipidemia Maternal Grandmother   . Ulcers Maternal Grandmother   . Lung cancer Father        Hx smoker  . Hypertension Maternal Grandfather        MI  . Heart disease Maternal Grandfather        myocardial infarction  . Kidney cancer Paternal Grandmother   . Ovarian cancer Maternal Aunt   . Breast cancer Paternal Aunt   . Diabetes Other   . Hypothyroidism Other        aunt x 2  . Colon cancer Neg Hx    Social History   Socioeconomic History  . Marital status: Single    Spouse name: Not on file  . Number of children: 0  . Years of education: Not on file  . Highest education level: Not on file  Occupational History  . Not on file  Tobacco Use  . Smoking status: Never Smoker  . Smokeless tobacco: Never Used  Substance and Sexual Activity  . Alcohol use: Yes    Alcohol/week: 0.0 standard drinks    Comment: rare  . Drug use: No  . Sexual activity: Not on file  Other Topics Concern  . Not on file  Social History Narrative   She is single and has no children. She works at [employer] as an Web designer.  Social Determinants of Health   Financial Resource Strain:   . Difficulty of Paying Living Expenses:   Food Insecurity:   . Worried About Charity fundraiser in the Last Year:   . Arboriculturist in the Last Year:   Transportation Needs:   . Film/video editor (Medical):   Marland Kitchen Lack of Transportation (Non-Medical):   Physical Activity:   . Days of Exercise per Week:   . Minutes of Exercise per Session:   Stress:   . Feeling of Stress :   Social Connections:   . Frequency of Communication with Friends and Family:   . Frequency of Social Gatherings with Friends and Family:   . Attends Religious Services:   . Active Member of Clubs or Organizations:   . Attends Archivist Meetings:   Marland Kitchen Marital Status:     Outpatient Encounter Medications as of 02/17/2020  Medication Sig  . albuterol (PROVENTIL HFA;VENTOLIN  HFA) 108 (90 Base) MCG/ACT inhaler Inhale 2 puffs into the lungs every 6 (six) hours as needed for wheezing or shortness of breath.  Marland Kitchen atorvastatin (LIPITOR) 10 MG tablet Take 1 tablet (10 mg total) by mouth daily.  . Cholecalciferol (VITAMIN D3) 1000 units CAPS   . Cyanocobalamin (B-12) 250 MCG TABS Take 250 mcg by mouth daily.  . fluticasone (FLONASE) 50 MCG/ACT nasal spray Place 2 sprays into the nose as needed for rhinitis.  Marland Kitchen lisinopril (ZESTRIL) 20 MG tablet Take 1 tablet (20 mg total) by mouth daily.  Marland Kitchen loratadine (CLARITIN) 10 MG tablet Take 10 mg by mouth daily.  . metFORMIN (GLUCOPHAGE) 500 MG tablet Take 1 tablet (500 mg total) by mouth 2 (two) times daily with a meal.  . vitamin E 400 UNIT capsule Take 400 Units by mouth daily.  . [DISCONTINUED] metFORMIN (GLUCOPHAGE) 500 MG tablet Take 1 tablet (500 mg total) by mouth daily.   No facility-administered encounter medications on file as of 02/17/2020.    Review of Systems  Constitutional: Negative for appetite change and unexpected weight change.  HENT: Negative for congestion and sinus pressure.   Respiratory: Negative for cough, chest tightness and shortness of breath.   Cardiovascular: Negative for chest pain, palpitations and leg swelling.  Gastrointestinal: Negative for abdominal pain, diarrhea, nausea and vomiting.  Genitourinary: Negative for difficulty urinating and dysuria.  Musculoskeletal: Negative for joint swelling and myalgias.  Skin: Negative for color change and rash.  Neurological: Negative for dizziness, light-headedness and headaches.  Psychiatric/Behavioral: Negative for agitation and dysphoric mood.       Objective:    Physical Exam Vitals reviewed.  Constitutional:      General: She is not in acute distress.    Appearance: Normal appearance.  HENT:     Head: Normocephalic and atraumatic.     Right Ear: External ear normal.     Left Ear: External ear normal.  Eyes:     General: No scleral icterus.        Right eye: No discharge.        Left eye: No discharge.     Conjunctiva/sclera: Conjunctivae normal.  Neck:     Thyroid: No thyromegaly.  Cardiovascular:     Rate and Rhythm: Normal rate and regular rhythm.  Pulmonary:     Effort: No respiratory distress.     Breath sounds: Normal breath sounds. No wheezing.  Abdominal:     General: Bowel sounds are normal.     Palpations: Abdomen is soft.  Tenderness: There is no abdominal tenderness.  Musculoskeletal:        General: No swelling or tenderness.     Cervical back: Neck supple. No tenderness.  Lymphadenopathy:     Cervical: No cervical adenopathy.  Skin:    Findings: No erythema or rash.  Neurological:     Mental Status: She is alert.  Psychiatric:        Mood and Affect: Mood normal.        Behavior: Behavior normal.     BP 120/82   Pulse 84   Temp 97.8 F (36.6 C) (Skin)   Ht 5' 2"  (1.575 m)   Wt 240 lb (108.9 kg)   SpO2 98%   BMI 43.90 kg/m  Wt Readings from Last 3 Encounters:  02/17/20 240 lb (108.9 kg)  11/17/19 239 lb 3.2 oz (108.5 kg)  07/19/19 240 lb (108.9 kg)     Lab Results  Component Value Date   WBC 3.4 (L) 02/15/2020   HGB 14.1 02/15/2020   HCT 41.6 02/15/2020   PLT 177.0 02/15/2020   GLUCOSE 199 (H) 02/15/2020   CHOL 236 (H) 02/15/2020   TRIG 205.0 (H) 02/15/2020   HDL 39.30 02/15/2020   LDLDIRECT 167.0 02/15/2020   LDLCALC 133 (H) 11/15/2019   ALT 151 (H) 02/15/2020   AST 127 (H) 02/15/2020   NA 134 (L) 02/15/2020   K 4.4 02/15/2020   CL 98 02/15/2020   CREATININE 0.84 02/15/2020   BUN 16 02/15/2020   CO2 29 02/15/2020   TSH 3.22 11/15/2019   INR 1.0 07/13/2017   HGBA1C 7.4 (H) 02/15/2020   MICROALBUR 0.7 03/30/2019    US Abdomen Complete  Result Date: 01/24/2020 CLINICAL DATA:  Nonalcoholic steatohepatitis. EXAM: ABDOMEN ULTRASOUND COMPLETE COMPARISON:  12/04/2015 complete abdominal ultrasound. FINDINGS: Gallbladder: No wall thickening visualized. Layering calcific  densities within the gallbladder measured up to 11 mm. No sonographic Murphy sign noted by sonographer. Common bile duct: Diameter: 5.4 mm Liver: No focal lesion identified. Increased parenchymal echogenicity with a coarse echotexture. Portal vein is patent on color Doppler imaging with normal direction of blood flow towards the liver. IVC: No abnormality visualized. Pancreas: Visualized portion unremarkable. Spleen: Size and appearance within normal limits, measuring 11.2 cm. Right Kidney: Length: 10.8 cm. Echogenicity within normal limits. 3.3 x 1.7 x 2.6 cm septated right interpole cyst without internal flow on Doppler imaging. No hydronephrosis visualized. Left Kidney: Length: 11.8 cm. Echogenicity within normal limits. No mass or hydronephrosis visualized. Abdominal aorta: No aneurysm visualized. Other findings: None. IMPRESSION: Hepatic steatosis with coarse echotexture.  No focal hepatic lesion. Cholelithiasis without cholecystitis. 3.3 cm interpolar right renal cyst, mildly increased in size and likely benign. Electronically Signed   By: Primitivo Gauze M.D.   On: 01/24/2020 19:24       Assessment & Plan:   Problem List Items Addressed This Visit    Abnormal liver function    S/p liver biopsy.  NASH.  Seeing GI.  Recent liver enzymes increased more.  Send copy to GI.  Diet, exercise and weight loss.  Follow liver function tests.        Relevant Orders   Hepatic function panel   Anemia    Follow cbc.        BMI 40.0-44.9, adult (HCC)    Diet and exercise.  Follow.        Relevant Medications   metFORMIN (GLUCOPHAGE) 500 MG tablet   Diabetes mellitus without complication (HCC)    Low  carb diet and exercise.  Follow met b and a1c.  Increase metformin to bid.  She has been on daily, but has not been taking regularly.  Discussed increase to bid.  Follow sugars.  Glucometer given.  Instructed on how to check sugars.        Relevant Medications   metFORMIN (GLUCOPHAGE) 500 MG tablet     Hypercholesteremia    On lipitor.  Low cholesterol diet and exercise.  Elevated liver enzymes.  Has known NASH.  D/w GI regarding continuing lipitor.  Discussed diet, exercise and weight loss.  Follow liver function tests.        Hypertension    Blood pressure as outlined.  Continue lisinopril.  Follow pressures.  Follow metabolic panel.        Renal cyst    Abdominal ultrasound as outlined.  Right renal cyst noted.  Slight increase in size.  Discussed evaluation by urology to confirm if any further w/up warranted.  She is in agreement.            Einar Pheasant, MD

## 2020-02-18 NOTE — Assessment & Plan Note (Signed)
S/p liver biopsy.  NASH.  Seeing GI.  Recent liver enzymes increased more.  Send copy to GI.  Diet, exercise and weight loss.  Follow liver function tests.

## 2020-02-18 NOTE — Assessment & Plan Note (Signed)
Abdominal ultrasound as outlined.  Right renal cyst noted.  Slight increase in size.  Discussed evaluation by urology to confirm if any further w/up warranted.  She is in agreement.

## 2020-02-18 NOTE — Assessment & Plan Note (Signed)
On lipitor.  Low cholesterol diet and exercise.  Elevated liver enzymes.  Has known NASH.  D/w GI regarding continuing lipitor.  Discussed diet, exercise and weight loss.  Follow liver function tests.

## 2020-02-18 NOTE — Assessment & Plan Note (Signed)
Follow cbc.  

## 2020-02-18 NOTE — Assessment & Plan Note (Signed)
Low carb diet and exercise.  Follow met b and a1c.  Increase metformin to bid.  She has been on daily, but has not been taking regularly.  Discussed increase to bid.  Follow sugars.  Glucometer given.  Instructed on how to check sugars.

## 2020-02-18 NOTE — Assessment & Plan Note (Signed)
Blood pressure as outlined.  Continue lisinopril.  Follow pressures.  Follow metabolic panel.

## 2020-02-18 NOTE — Assessment & Plan Note (Signed)
Diet and exercise.  Follow.  

## 2020-03-20 ENCOUNTER — Other Ambulatory Visit: Payer: Self-pay

## 2020-03-20 ENCOUNTER — Other Ambulatory Visit (INDEPENDENT_AMBULATORY_CARE_PROVIDER_SITE_OTHER): Payer: BC Managed Care – PPO

## 2020-03-20 DIAGNOSIS — R945 Abnormal results of liver function studies: Secondary | ICD-10-CM

## 2020-03-20 LAB — HEPATIC FUNCTION PANEL
ALT: 105 U/L — ABNORMAL HIGH (ref 0–35)
AST: 74 U/L — ABNORMAL HIGH (ref 0–37)
Albumin: 4.6 g/dL (ref 3.5–5.2)
Alkaline Phosphatase: 69 U/L (ref 39–117)
Bilirubin, Direct: 0.1 mg/dL (ref 0.0–0.3)
Total Bilirubin: 0.4 mg/dL (ref 0.2–1.2)
Total Protein: 7 g/dL (ref 6.0–8.3)

## 2020-03-26 ENCOUNTER — Encounter: Payer: Self-pay | Admitting: Internal Medicine

## 2020-04-12 ENCOUNTER — Encounter: Payer: Self-pay | Admitting: Internal Medicine

## 2020-04-12 LAB — COLOGUARD: Cologuard: NEGATIVE

## 2020-04-13 NOTE — Telephone Encounter (Signed)
LMTCB

## 2020-04-13 NOTE — Telephone Encounter (Signed)
With elevated am sugars, please confirm if eating a snack at night.  Also, when does she eat her evening meal?  Since her sugars are remaining elevated in the am, can schedule an appt for [place] to discuss further treatment.    Dr Nicki Reaper

## 2020-04-16 NOTE — Telephone Encounter (Signed)
No snack at night time. Evening meal 6:30-7:00. Scheduled appt for next week to discuss

## 2020-04-17 ENCOUNTER — Encounter: Payer: Self-pay | Admitting: Internal Medicine

## 2020-04-17 ENCOUNTER — Telehealth: Payer: Self-pay | Admitting: Internal Medicine

## 2020-04-17 NOTE — Telephone Encounter (Signed)
Pt called in stated that she would make appointment on Mychart

## 2020-04-18 LAB — COLOGUARD: COLOGUARD: NEGATIVE

## 2020-04-18 NOTE — Telephone Encounter (Signed)
Pt rescheduled with Dr Nicki Reaper

## 2020-04-23 ENCOUNTER — Ambulatory Visit: Payer: BC Managed Care – PPO | Admitting: Internal Medicine

## 2020-04-26 ENCOUNTER — Other Ambulatory Visit: Payer: Self-pay | Admitting: Internal Medicine

## 2020-05-01 ENCOUNTER — Other Ambulatory Visit: Payer: Self-pay

## 2020-05-03 ENCOUNTER — Encounter: Payer: Self-pay | Admitting: Internal Medicine

## 2020-05-03 ENCOUNTER — Ambulatory Visit (INDEPENDENT_AMBULATORY_CARE_PROVIDER_SITE_OTHER): Payer: BC Managed Care – PPO | Admitting: Internal Medicine

## 2020-05-03 ENCOUNTER — Other Ambulatory Visit: Payer: Self-pay

## 2020-05-03 VITALS — BP 114/76 | HR 90 | Temp 98.4°F | Resp 16 | Ht 62.0 in | Wt 233.8 lb

## 2020-05-03 DIAGNOSIS — F439 Reaction to severe stress, unspecified: Secondary | ICD-10-CM

## 2020-05-03 DIAGNOSIS — N281 Cyst of kidney, acquired: Secondary | ICD-10-CM

## 2020-05-03 DIAGNOSIS — R945 Abnormal results of liver function studies: Secondary | ICD-10-CM

## 2020-05-03 DIAGNOSIS — D72819 Decreased white blood cell count, unspecified: Secondary | ICD-10-CM

## 2020-05-03 DIAGNOSIS — E119 Type 2 diabetes mellitus without complications: Secondary | ICD-10-CM

## 2020-05-03 DIAGNOSIS — D649 Anemia, unspecified: Secondary | ICD-10-CM

## 2020-05-03 DIAGNOSIS — E7800 Pure hypercholesterolemia, unspecified: Secondary | ICD-10-CM

## 2020-05-03 DIAGNOSIS — I10 Essential (primary) hypertension: Secondary | ICD-10-CM

## 2020-05-03 MED ORDER — METFORMIN HCL ER 500 MG PO TB24: ORAL_TABLET | ORAL | 3 refills | Status: DC

## 2020-05-03 NOTE — Progress Notes (Signed)
[name], female   DOB: 1961-01-04, 59 y.o.   MRN: [id number]   Subjective:    [name], female    DOB: [birth year], 59 y.o.   MRN: [id number]  HPI This visit occurred during the SARS-CoV-2 public health emergency.  Safety protocols were in place, including screening questions prior to the visit, additional usage of staff PPE, and extensive cleaning of exam room while observing appropriate contact time as indicated for disinfecting solutions.  Patient here for work in appt to discuss her blood sugars.  Increased stress.  Is seeing a Social worker.  They are discussing her stress, gaslighting, etc.  Overall she feels she is handling things relatively well.  Trying to stay active.  No chest pain or sob reported.  Elevated liver enzymes.  S/p liver biopsy - NASH.  Seeing GI.  Discussed diet, exercise and weight loss.  Has lost weight since previous visit.  cologuard - negative - per report.  Discussed changing metformin to XR.  Sugars elevated.  Last a1c 7.4.  Discussed CCM referral.    Past Medical History:  Diagnosis Date  . Abnormal liver function   . Allergy   . Anemia   . GERD (gastroesophageal reflux disease)   . Heart murmur   . Hypercholesterolemia   . Hyperglycemia   . Hypertension    Past Surgical History:  Procedure Laterality Date  . ABDOMINAL HYSTERECTOMY  2010  . ACHILLES TENDON REPAIR  2009  . BREAST BIOPSY Left 07/30/2006   benign  . BREAST BIOPSY Left 02/18/2007 & 03/05/2007   benign  . BREAST EXCISIONAL BIOPSY    . BREAST SURGERY  2008  . LUMBAR MICRODISCECTOMY  2004  . ROTATOR CUFF REPAIR  2006  . SEPTOPLASTY  1990  . TONSILLECTOMY AND ADENOIDECTOMY  1969   Family History  Problem Relation Age of Onset  . Hyperlipidemia Mother   . Hypertension Mother   . Stroke Mother   . Heart disease Mother   . Hypothyroidism Mother   . Pernicious anemia Mother        aunt x 2  . Hyperlipidemia Maternal Grandmother   . Ulcers Maternal  Grandmother   . Lung cancer Father        Hx smoker  . Hypertension Maternal Grandfather        MI  . Heart disease Maternal Grandfather        myocardial infarction  . Kidney cancer Paternal Grandmother   . Ovarian cancer Maternal Aunt   . Breast cancer Paternal Aunt   . Diabetes Other   . Hypothyroidism Other        aunt x 2  . Colon cancer Neg Hx    Social History   Socioeconomic History  . Marital status: Single    Spouse name: Not on file  . Number of children: 0  . Years of education: Not on file  . Highest education level: Not on file  Occupational History  . Not on file  Tobacco Use  . Smoking status: Never Smoker  . Smokeless tobacco: Never Used  Vaping Use  . Vaping Use: Never used  Substance and Sexual Activity  . Alcohol use: Yes    Alcohol/week: 0.0 standard drinks    Comment: rare  . Drug use: No  . Sexual activity: Not on file  Other Topics Concern  . Not on file  Social History Narrative   She is single and has no children. 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    Social Determinants of Health   Financial Resource Strain:   . Difficulty of Paying Living Expenses: Not on file  Food Insecurity:   . Worried About Charity fundraiser in the Last Year: Not on file  . Ran Out of Food in the Last Year: Not on file  Transportation Needs:   . Lack of Transportation (Medical): Not on file  . Lack of Transportation (Non-Medical): Not on file  Physical Activity:   . Days of Exercise per Week: Not on file  . Minutes of Exercise per Session: Not on file  Stress:   . Feeling of Stress : Not on file  Social Connections:   . Frequency of Communication with Friends and Family: Not on file  . Frequency of Social Gatherings with Friends and Family: Not on file  . Attends Religious Services: Not on file  . Active Member of Clubs or Organizations: Not on file  . Attends Archivist Meetings: Not on file  . Marital Status:  Not on file    Outpatient Encounter Medications as of 05/03/2020  Medication Sig  . albuterol (PROVENTIL HFA;VENTOLIN HFA) 108 (90 Base) MCG/ACT inhaler Inhale 2 puffs into the lungs every 6 (six) hours as needed for wheezing or shortness of breath.  Marland Kitchen atorvastatin (LIPITOR) 10 MG tablet Take 1 tablet (10 mg total) by mouth daily.  . Cholecalciferol (VITAMIN D3) 1000 units CAPS   . Cyanocobalamin (B-12) 250 MCG TABS Take 250 mcg by mouth daily.  . fluticasone (FLONASE) 50 MCG/ACT nasal spray Place 2 sprays into the nose as needed for rhinitis.  Marland Kitchen lisinopril (ZESTRIL) 20 MG tablet Take 1 tablet (20 mg total) by mouth daily.  Marland Kitchen loratadine (CLARITIN) 10 MG tablet Take 10 mg by mouth daily.  . vitamin E 400 UNIT capsule Take 400 Units by mouth daily.  . [DISCONTINUED] metFORMIN (GLUCOPHAGE) 500 MG tablet Take 1 tablet (500 mg total) by mouth 2 (two) times daily with a meal.  . metFORMIN (GLUCOPHAGE XR) 500 MG 24 hr tablet Take two tablets bid   No facility-administered encounter medications on file as of 05/03/2020.    Review of Systems  Constitutional: Negative for appetite change and unexpected weight change.  HENT: Negative for congestion and sinus pressure.   Respiratory: Negative for cough, chest tightness and shortness of breath.   Cardiovascular: Negative for chest pain, palpitations and leg swelling.  Gastrointestinal: Negative for abdominal pain, nausea and vomiting.  Genitourinary: Negative for difficulty urinating and dysuria.  Musculoskeletal: Negative for joint swelling and myalgias.  Skin: Negative for color change and rash.  Neurological: Negative for dizziness and headaches.  Psychiatric/Behavioral: Negative for agitation and dysphoric mood.       Objective:    Physical Exam Vitals reviewed.  Constitutional:      General: She is not in acute distress.    Appearance: Normal appearance.  HENT:     Head: Normocephalic and atraumatic.     Right Ear: External ear  normal.     Left Ear: External ear normal.  Eyes:     General: No scleral icterus.       Right eye: No discharge.        Left eye: No discharge.     Conjunctiva/sclera: Conjunctivae normal.  Neck:     Thyroid: No thyromegaly.  Cardiovascular:     Rate and Rhythm: Normal rate and regular rhythm.  Pulmonary:     Effort: No respiratory  distress.     Breath sounds: Normal breath sounds. No wheezing.  Abdominal:     General: Bowel sounds are normal.     Palpations: Abdomen is soft.     Tenderness: There is no abdominal tenderness.  Musculoskeletal:        General: No swelling or tenderness.     Cervical back: Neck supple. No tenderness.  Lymphadenopathy:     Cervical: No cervical adenopathy.  Skin:    Findings: No erythema or rash.  Neurological:     Mental Status: She is alert.  Psychiatric:        Mood and Affect: Mood normal.        Behavior: Behavior normal.     BP 114/76 (BP Location: Left Arm, Patient Position: Sitting, Cuff Size: Large)   Pulse 90   Temp 98.4 F (36.9 C) (Oral)   Resp 16   Ht 5' 2"  (1.575 m)   Wt 233 lb 12.8 oz (106.1 kg)   SpO2 96%   BMI 42.76 kg/m  Wt Readings from Last 3 Encounters:  05/03/20 233 lb 12.8 oz (106.1 kg)  02/17/20 240 lb (108.9 kg)  11/17/19 239 lb 3.2 oz (108.5 kg)     Lab Results  Component Value Date   WBC 3.4 (L) 02/15/2020   HGB 14.1 02/15/2020   HCT 41.6 02/15/2020   PLT 177.0 02/15/2020   GLUCOSE 199 (H) 02/15/2020   CHOL 236 (H) 02/15/2020   TRIG 205.0 (H) 02/15/2020   HDL 39.30 02/15/2020   LDLDIRECT 167.0 02/15/2020   LDLCALC 133 (H) 11/15/2019   ALT 105 (H) 03/20/2020   AST 74 (H) 03/20/2020   NA 134 (L) 02/15/2020   K 4.4 02/15/2020   CL 98 02/15/2020   CREATININE 0.84 02/15/2020   BUN 16 02/15/2020   CO2 29 02/15/2020   TSH 3.22 11/15/2019   INR 1.0 07/13/2017   HGBA1C 7.4 (H) 02/15/2020   MICROALBUR 0.7 03/30/2019    US Abdomen Complete  Result Date: 01/24/2020 CLINICAL DATA:  Nonalcoholic  steatohepatitis. EXAM: ABDOMEN ULTRASOUND COMPLETE COMPARISON:  12/04/2015 complete abdominal ultrasound. FINDINGS: Gallbladder: No wall thickening visualized. Layering calcific densities within the gallbladder measured up to 11 mm. No sonographic Murphy sign noted by sonographer. Common bile duct: Diameter: 5.4 mm Liver: No focal lesion identified. Increased parenchymal echogenicity with a coarse echotexture. Portal vein is patent on color Doppler imaging with normal direction of blood flow towards the liver. IVC: No abnormality visualized. Pancreas: Visualized portion unremarkable. Spleen: Size and appearance within normal limits, measuring 11.2 cm. Right Kidney: Length: 10.8 cm. Echogenicity within normal limits. 3.3 x 1.7 x 2.6 cm septated right interpole cyst without internal flow on Doppler imaging. No hydronephrosis visualized. Left Kidney: Length: 11.8 cm. Echogenicity within normal limits. No mass or hydronephrosis visualized. Abdominal aorta: No aneurysm visualized. Other findings: None. IMPRESSION: Hepatic steatosis with coarse echotexture.  No focal hepatic lesion. Cholelithiasis without cholecystitis. 3.3 cm interpolar right renal cyst, mildly increased in size and likely benign. Electronically Signed   By: Primitivo Gauze M.D.   On: 01/24/2020 19:24       Assessment & Plan:   Problem List Items Addressed This Visit    Stress    Continue f/u with counselor.        Renal cyst    Abdominal ultrasound with right renal cyst.  Slight increase in size.  Previously discussed evaluation by urology.  No records.  F/u to see if scheduled.  Hypertension    Continue lisinopril.  Pressures as outlined.  132/78 on my check.  Follow pressures.  Follow metabolic panel.        Hypercholesteremia    On lipitor.  Low cholesterol diet and exercise.  Follow lipid panel and liver function tests.        Relevant Orders   Hepatic function panel   Lipid panel   Diabetes mellitus without  complication (HCC)    S2A elevated - 7.4 on last check.  Discussed diet, exercise and weight loss.  Weight is down from last check.  Discussed importance of getting sugars under control with history of NASH.  Change metformin to XR.  Discussed other treatment options.  Discussed CCM referral.        Relevant Medications   metFORMIN (GLUCOPHAGE XR) 500 MG 24 hr tablet   Other Relevant Orders   Hemoglobin J6O   Basic metabolic panel   Microalbumin / creatinine urine ratio   Anemia    Follow cbc.       Abnormal liver function    S/p liver biopsy.  NASH.  Seeing GI.  Continue diet, exercise and weight loss.  Follow liver function tests.        Other Visit Diagnoses    Leukopenia, unspecified type    -  Primary   Relevant Orders   CBC with Differential/Platelet       Einar Pheasant, MD

## 2020-05-13 ENCOUNTER — Encounter: Payer: Self-pay | Admitting: Internal Medicine

## 2020-05-13 ENCOUNTER — Telehealth: Payer: Self-pay | Admitting: Internal Medicine

## 2020-05-13 DIAGNOSIS — N281 Cyst of kidney, acquired: Secondary | ICD-10-CM

## 2020-05-13 DIAGNOSIS — F339 Major depressive disorder, recurrent, unspecified: Secondary | ICD-10-CM | POA: Insufficient documentation

## 2020-05-13 DIAGNOSIS — E119 Type 2 diabetes mellitus without complications: Secondary | ICD-10-CM

## 2020-05-13 DIAGNOSIS — F439 Reaction to severe stress, unspecified: Secondary | ICD-10-CM | POA: Insufficient documentation

## 2020-05-13 NOTE — Assessment & Plan Note (Signed)
On lipitor.  Low cholesterol diet and exercise.  Follow lipid panel and liver function tests.   

## 2020-05-13 NOTE — Assessment & Plan Note (Signed)
Continue lisinopril.  Pressures as outlined.  132/78 on my check.  Follow pressures.  Follow metabolic panel.

## 2020-05-13 NOTE — Assessment & Plan Note (Signed)
Abdominal ultrasound with right renal cyst.  Slight increase in size.  Previously discussed evaluation by urology.  No records.  F/u to see if scheduled.

## 2020-05-13 NOTE — Assessment & Plan Note (Signed)
S/p liver biopsy.  NASH.  Seeing GI.  Continue diet, exercise and weight loss.  Follow liver function tests.

## 2020-05-13 NOTE — Assessment & Plan Note (Signed)
Follow cbc.  

## 2020-05-13 NOTE — Telephone Encounter (Signed)
My chart message sent to pt regarding update.

## 2020-05-13 NOTE — Assessment & Plan Note (Signed)
Continue f/u with counselor.

## 2020-05-13 NOTE — Assessment & Plan Note (Signed)
a1c elevated - 7.4 on last check.  Discussed diet, exercise and weight loss.  Weight is down from last check.  Discussed importance of getting sugars under control with history of NASH.  Change metformin to XR.  Discussed other treatment options.  Discussed CCM referral.

## 2020-05-19 NOTE — Addendum Note (Signed)
Addended by: Alisa Graff on: 05/19/2020 10:38 AM   Modules accepted: Orders

## 2020-05-19 NOTE — Telephone Encounter (Signed)
CCM referral placed and urology referral.

## 2020-05-22 ENCOUNTER — Ambulatory Visit: Payer: BC Managed Care – PPO | Admitting: Internal Medicine

## 2020-05-22 ENCOUNTER — Telehealth: Payer: Self-pay

## 2020-05-22 NOTE — Chronic Care Management (AMB) (Signed)
  Care Management   Note  05/22/2020 Name: Carly Fields MRN: [id number] DOB: 06/28/1961  Carly Fields is a 59 y.o. year old female who is a primary care patient of Einar Pheasant, MD. I reached out to Roxanne Gates by phone today in response to a referral sent by [name]'s PCP Einar Pheasant, MD    [name] was given information about care management services today including:  1. Care management services include personalized support from designated clinical staff supervised by her physician, including individualized plan of care and coordination with other care providers 2. 24/7 contact phone numbers for assistance for urgent and routine care needs. 3. The patient may stop care management services at any time by phone call to the office staff.  Patient agreed to services and verbal consent obtained.   Follow up plan: Telephone appointment with care management team member scheduled for:06/14/2020  Noreene Larsson, [place], [place], [place] [address] Direct Dial: [phone] [email or web address]

## 2020-06-11 ENCOUNTER — Other Ambulatory Visit: Payer: Self-pay | Admitting: Gastroenterology

## 2020-06-11 DIAGNOSIS — K7581 Nonalcoholic steatohepatitis (NASH): Secondary | ICD-10-CM

## 2020-06-14 ENCOUNTER — Ambulatory Visit: Payer: BC Managed Care – PPO | Admitting: Pharmacist

## 2020-06-14 DIAGNOSIS — E119 Type 2 diabetes mellitus without complications: Secondary | ICD-10-CM

## 2020-06-14 DIAGNOSIS — E7800 Pure hypercholesterolemia, unspecified: Secondary | ICD-10-CM

## 2020-06-14 NOTE — Chronic Care Management (AMB) (Signed)
Chronic Care Management   Note  06/14/2020 Name: Carly Fields MRN: [id number] DOB: 12-01-1960   Subjective:  Carly Fields is a 59 y.o. year old female who is a primary care patient of Einar Pheasant, MD. The CCM team was consulted for assistance with chronic disease management and care coordination needs.     Contacted patient for initial medication access and medication management support  Ms. Arko was given information about Chronic Care Management services today including:  1. CCM service includes personalized support from designated clinical staff supervised by her physician, including individualized plan of care and coordination with other care providers 2. Only one practitioner may furnish and bill the service in a calendar month. 3. The patient may stop CCM services at any time (effective at the end of the month) by phone call to the office staff.   Patient agreed to services and verbal consent obtained.   Review of patient status, including review of consultants reports, laboratory and other test data, was performed as part of comprehensive evaluation and provision of chronic care management services.   SDOH (Social Determinants of Health) assessments and interventions performed:  SDOH Interventions     Most Recent Value  SDOH Interventions  Financial Strain Interventions Intervention Not Indicated       Objective:  Lab Results  Component Value Date   CREATININE 0.84 02/15/2020   CREATININE 0.83 11/15/2019   CREATININE 0.88 03/30/2019    Lab Results  Component Value Date   HGBA1C 7.4 (H) 02/15/2020       Component Value Date/Time   CHOL 236 (H) 02/15/2020 0802   TRIG 205.0 (H) 02/15/2020 0802   HDL 39.30 02/15/2020 0802   CHOLHDL 6 02/15/2020 0802   VLDL 41.0 (H) 02/15/2020 0802   LDLCALC 133 (H) 11/15/2019 0801   LDLDIRECT 167.0 02/15/2020 0802    Clinical ASCVD: No  The 10-year ASCVD risk score Mikey Bussing DC Jr., et al., 2013) is: 12.7%   Values  used to calculate the score:     Age: 56 years     Sex: Female     Is Non-Hispanic African American: No     Diabetic: Yes     Tobacco smoker: No     Systolic Blood Pressure: 850 mmHg     Is BP treated: Yes     HDL Cholesterol: 39.3 mg/dL     Total Cholesterol: 236 mg/dL    BP Readings from Last 3 Encounters:  05/03/20 114/76  02/18/20 120/82  11/17/19 140/86    Allergies  Allergen Reactions   Penicillins    Tetanus Toxoids     Medications Reviewed Today    Reviewed by De Hollingshead, RPH-CPP (Pharmacist) on 06/14/20 at 1533  Med List Status: <None>  Medication Order Taking? Sig Documenting Provider Last Dose Status Informant  albuterol (PROVENTIL HFA;VENTOLIN HFA) 108 (90 Base) MCG/ACT inhaler [id number]  Inhale 2 puffs into the lungs every 6 (six) hours as needed for wheezing or shortness of breath. Einar Pheasant, MD  Active   atorvastatin (LIPITOR) 10 MG tablet [id number] Yes Take 1 tablet (10 mg total) by mouth daily. Einar Pheasant, MD Taking Active            Med Note Darnelle Maffucci, Maralyn Sago Jun 14, 2020  3:31 PM) Three days weekly   Cholecalciferol (VITAMIN D3) 1000 units CAPS [id number] Yes Take 1 capsule by mouth daily.  [provider] Taking Active   Cyanocobalamin (B-12) 250 MCG TABS [id number] Yes  Take 250 mcg by mouth daily. [provider] Taking Active   fluticasone (FLONASE) 50 MCG/ACT nasal spray [id number] Yes Place 2 sprays into the nose as needed for rhinitis. [provider] Taking Active Self  lisinopril (ZESTRIL) 20 MG tablet [id number] Yes Take 1 tablet (20 mg total) by mouth daily. Einar Pheasant, MD Taking Active   loratadine (CLARITIN) 10 MG tablet [id number] Yes Take 10 mg by mouth daily. [provider] Taking Active Self  metFORMIN (GLUCOPHAGE XR) 500 MG 24 hr tablet [id number] Yes Take two tablets bid Einar Pheasant, MD Taking Active   vitamin E 400 UNIT capsule [id number] Yes Take 400 Units by mouth daily.  [provider] Taking Active            Assessment:   Goals Addressed              This Visit's Progress     Patient Stated     PharmD "I want to stay healthy" (pt-stated)        CARE PLAN ENTRY (see longitudinal plan of care for additional care plan information)  Current Barriers:   Social, financial, community barriers:  o Denies any at this time.   Diabetes: uncontrolled, complicated by chronic medical conditions including NASH, most recent A1c 7.4%  Most recent eGFR: 69 mL/min  Current antihyperglycemic regimen: metformin XR 1000 mg BID  Current meal patterns: has been more attentive to what she is eating. Has been eating supper earlier and avoiding snacking  Current blood glucose readings: improved since increasing dose of metformin o Fasting: 117, but then 144 about an hour later after getting up and showering; in general, gastings at goal <130 o Evening: 111-120  Cardiovascular risk reduction: o Current hypertensive regimen: lisinopril 20 mg daily, last BP at goal  o Current hyperlipidemia regimen: atorvastatin 10 mg TIW; hepatic function panel upcoming. Reports that she had been on atorvastatin 10 mg daily, but LFTs trended up so the dose was reduced. Baseline LDL 176. 10 year ASCVD risk 12.7% o Current antiplatelet regimen: n/a  Eye exam: due  Nephropathy screening: due, scheduled tomorrow  Allergies: Flonase 50 mcg PRN, loratidine 10 mg daily  Pharmacist Clinical Goal(s):   Over the next 90 days, patient will work with PharmD and primary care provider to address optimized medication management  Interventions:  Comprehensive medication review performed, medication list updated in electronic medical record  Inter-disciplinary care team collaboration (see longitudinal plan of care)  Reviewed goal A1c, goal fasting, and goal post prandial glucose readings. Appears that dose increase in metformin has gotten patient's sugar to goal. Continue  metformin XR 1000 mg BID.   Discussed benefit of GLP1 for weight loss in NASH. Discussed Victoza vs Ozempic. Discussed side effects, benefits. Patient would like to see A1c results and discuss w/ Dr. Nicki Reaper prior to adding any medications. Agree with this, given patient's success in weight loss through diet and exercise. If plateau is reached, GLP1 would be an option.   Discussed need for improved lipid control. Goal LDL <70 given DM + HTN. Continue to follow LFTs and collaborate w/ GI. May be benefit in transition to alternative statin, such as rosuvastatin, or pursue of non-statin therapy.  Patient Self Care Activities:   Patient will check blood glucose BID , document, and provide at future appointments  Patient will take medications as prescribed  Patient will report any questions or concerns to provider   Initial goal documentation        Plan: -  Scheduled f/u call in ~ 5-6 weeks  Catie Darnelle Maffucci, PharmD, [place], [place] Pharmacist [place] [phone]

## 2020-06-14 NOTE — Patient Instructions (Signed)
It was great talking with you today!  Our goal A1c is less than 7%. This corresponds with fasting sugars less than 130 and 2 hour after meal sugars less than 180. Please check your blood sugar twice daily.   The next thing I would recommend is a GLP1 Receptor Agonist. This would be a daily injection called Victoza or weekly injection called Ozempic. Both of these medications help with glucose control and weight loss, which would obviously be beneficial for your NASH.   Our goal bad cholesterol, or LDL, is less than 70. We'll continue to collaborate with Dr. Nicki Reaper and Danielle Dess to determine the best balance of treatment for your cholesterol and liver safety.   Feel free to call me with any questions or concerns. I look forward to our next visit!  Catie Darnelle Maffucci, PharmD, [place], CPP Direct line: [phone] Clinic phone: [phone]  Visit Information  Goals Addressed              This Visit's Progress     Patient Stated     PharmD "I want to stay healthy" (pt-stated)        CARE PLAN ENTRY (see longitudinal plan of care for additional care plan information)  Current Barriers:   Social, financial, community barriers:  o Denies any at this time.   Diabetes: uncontrolled, complicated by chronic medical conditions including NASH, most recent A1c 7.4%  Most recent eGFR: 69 mL/min  Current antihyperglycemic regimen: metformin XR 1000 mg BID  Current meal patterns: has been more attentive to what she is eating. Has been eating supper earlier and avoiding snacking  Current blood glucose readings: improved since increasing dose of metformin o Fasting: 117, but then 144 about an hour later after getting up and showering; in general, gastings at goal <130 o Evening: 111-120  Cardiovascular risk reduction: o Current hypertensive regimen: lisinopril 20 mg daily, last BP at goal  o Current hyperlipidemia regimen: atorvastatin 10 mg TIW; hepatic function panel upcoming. Reports that  she had been on atorvastatin 10 mg daily, but LFTs trended up so the dose was reduced. Baseline LDL 176. 10 year ASCVD risk 12.7% o Current antiplatelet regimen: n/a  Eye exam: due  Nephropathy screening: due, scheduled tomorrow  Allergies: Flonase 50 mcg PRN, loratidine 10 mg daily  Pharmacist Clinical Goal(s):   Over the next 90 days, patient will work with PharmD and primary care provider to address optimized medication management  Interventions:  Comprehensive medication review performed, medication list updated in electronic medical record  Inter-disciplinary care team collaboration (see longitudinal plan of care)  Reviewed goal A1c, goal fasting, and goal post prandial glucose readings. Appears that dose increase in metformin has gotten patient's sugar to goal. Continue metformin XR 1000 mg BID.   Discussed benefit of GLP1 for weight loss in NASH. Discussed Victoza vs Ozempic. Discussed side effects, benefits. Patient would like to see A1c results and discuss w/ Dr. Nicki Reaper prior to adding any medications. Agree with this, given patient's success in weight loss through diet and exercise. If plateau is reached, GLP1 would be an option.   Discussed need for improved lipid control. Goal LDL <70 given DM + HTN. Continue to follow LFTs and collaborate w/ GI. May be benefit in transition to alternative statin, such as rosuvastatin, or pursue of non-statin therapy.  Patient Self Care Activities:   Patient will check blood glucose BID , document, and provide at future appointments  Patient will take medications as prescribed  Patient will report any  questions or concerns to provider   Initial goal documentation        Carly Fields was given information about Chronic Care Management services today including:  1. CCM service includes personalized support from designated clinical staff supervised by her physician, including individualized plan of care and coordination with other care  providers 2. 24/7 contact phone numbers for assistance for urgent and routine care needs 3. The patient may stop CCM services at any time (effective at the end of the month) by phone call to the office staff.  Patient agreed to services and verbal consent obtained.   The patient verbalized understanding of instructions provided today and agreed to receive a mailed copy of patient instruction and/or educational materials.  Plan: - Scheduled f/u call in ~ 5-6 weeks  Catie Darnelle Maffucci, PharmD, [place], [place] Pharmacist [place] [phone]

## 2020-06-14 NOTE — Progress Notes (Signed)
06/15/2020 1:56 PM   Roxanne Gates [birth year] [id number]  Referring provider: Einar Pheasant, [address] [place],  [place] [address] Chief Complaint  Patient presents with  . New Patient (Initial Visit)    renal?    HPI: Carly Fields is a 59 y.o. female who is seen today for management of right renal cyst.  Patient is followed by GI at [facility] for NASH. S/p liver biospy in 01/2016. Pathology noted steatohepatitis, brunt system grade 2, stage 3. No stainable iron seen. PAS-D was negative.   Abdominal ultrasound on 01/24/2020 noted a 3.3 cm interpolar right renal cyst, mildly increased in size and likely benign. Hepatic steatosis with coarse echotexture. No focal hepatic lesion. Cholelithiasis without cholecystitis.  She is followed by Stephens November, NP in GI for live issues.   Her paternal grandmother may have passed from kidney cancer. She will check her family records to ensure this diagnosis.   PMH: Past Medical History:  Diagnosis Date  . Abnormal liver function   . Allergy   . Anemia   . GERD (gastroesophageal reflux disease)   . Heart murmur   . Hypercholesterolemia   . Hyperglycemia   . Hypertension     Surgical History: Past Surgical History:  Procedure Laterality Date  . ABDOMINAL HYSTERECTOMY  2010  . ACHILLES TENDON REPAIR  2009  . BREAST BIOPSY Left 07/30/2006   benign  . BREAST BIOPSY Left 02/18/2007 & 03/05/2007   benign  . BREAST EXCISIONAL BIOPSY    . BREAST SURGERY  2008  . LUMBAR MICRODISCECTOMY  2004  . ROTATOR CUFF REPAIR  2006  . SEPTOPLASTY  1990  . TONSILLECTOMY AND ADENOIDECTOMY  1969    Home Medications:  Allergies as of 06/15/2020      Reactions   Penicillins    Tetanus Toxoids       Medication List       Accurate as of June 15, 2020  1:56 PM. If you have any questions, ask your nurse or doctor.        albuterol 108 (90 Base) MCG/ACT inhaler Commonly known as: VENTOLIN HFA Inhale 2 puffs  into the lungs every 6 (six) hours as needed for wheezing or shortness of breath.   atorvastatin 10 MG tablet Commonly known as: LIPITOR Take 1 tablet (10 mg total) by mouth daily.   B-12 250 MCG Tabs Take 250 mcg by mouth daily.   fluticasone 50 MCG/ACT nasal spray Commonly known as: FLONASE Place 2 sprays into the nose as needed for rhinitis.   lisinopril 20 MG tablet Commonly known as: ZESTRIL Take 1 tablet (20 mg total) by mouth daily.   loratadine 10 MG tablet Commonly known as: CLARITIN Take 10 mg by mouth daily.   metFORMIN 500 MG 24 hr tablet Commonly known as: Glucophage XR Take two tablets bid   Vitamin D3 25 MCG (1000 UT) Caps Take 1 capsule by mouth daily.   vitamin E 180 MG (400 UNITS) capsule Take 400 Units by mouth daily.       Allergies:  Allergies  Allergen Reactions  . Penicillins   . Tetanus Toxoids     Family History: Family History  Problem Relation Age of Onset  . Hyperlipidemia Mother   . Hypertension Mother   . Stroke Mother   . Heart disease Mother   . Hypothyroidism Mother   . Pernicious anemia Mother        aunt x 2  . Hyperlipidemia Maternal Grandmother   .  Ulcers Maternal Grandmother   . Lung cancer Father        Hx smoker  . Hypertension Maternal Grandfather        MI  . Heart disease Maternal Grandfather        myocardial infarction  . Kidney cancer Paternal Grandmother   . Ovarian cancer Maternal Aunt   . Breast cancer Paternal Aunt   . Diabetes Other   . Hypothyroidism Other        aunt x 2  . Colon cancer Neg Hx     Social History:  reports that she has never smoked. She has never used smokeless tobacco. She reports current alcohol use. She reports that she does not use drugs.   Physical Exam: BP (!) 161/85   Pulse 76   Ht 5' 2"  (1.575 m)   Wt 233 lb (105.7 kg)   BMI 42.62 kg/m   Constitutional:  Alert and oriented, No acute distress. HEENT: [place] AT, moist mucus membranes.  Trachea midline, no  masses. Cardiovascular: No clubbing, cyanosis, or edema. Respiratory: Normal respiratory effort, no increased work of breathing. Skin: No rashes, bruises or suspicious lesions. Neurologic: Grossly intact, no focal deficits, moving all 4 extremities. Psychiatric: Normal mood and affect.  Laboratory Data:  Lab Results  Component Value Date   CREATININE 0.89 06/15/2020    Lab Results  Component Value Date   HGBA1C 6.4 06/15/2020    Pertinent Imaging: CLINICAL DATA:  Nonalcoholic steatohepatitis.  EXAM: ABDOMEN ULTRASOUND COMPLETE  COMPARISON:  12/04/2015 complete abdominal ultrasound.  FINDINGS: Gallbladder: No wall thickening visualized. Layering calcific densities within the gallbladder measured up to 11 mm. No sonographic Murphy sign noted by sonographer.  Common bile duct: Diameter: 5.4 mm  Liver: No focal lesion identified. Increased parenchymal echogenicity with a coarse echotexture. Portal vein is patent on color Doppler imaging with normal direction of blood flow towards the liver.  IVC: No abnormality visualized.  Pancreas: Visualized portion unremarkable.  Spleen: Size and appearance within normal limits, measuring 11.2 cm.  Right Kidney: Length: 10.8 cm. Echogenicity within normal limits. 3.3 x 1.7 x 2.6 cm septated right interpole cyst without internal flow on Doppler imaging. No hydronephrosis visualized.  Left Kidney: Length: 11.8 cm. Echogenicity within normal limits. No mass or hydronephrosis visualized.  Abdominal aorta: No aneurysm visualized.  Other findings: None.  IMPRESSION: Hepatic steatosis with coarse echotexture.  No focal hepatic lesion.  Cholelithiasis without cholecystitis.  3.3 cm interpolar right renal cyst, mildly increased in size and likely benign.   Electronically Signed   By: Primitivo Gauze M.D.   On: 01/24/2020 19:24  I have personally reviewed the images and agree with radiologist  interpretation.     Assessment & Plan:    1. Simple right renal cyst  No complex features seen on [place] 01/24/20, compared to imaging in 2017 cyst was 2.37 cm in largest diameter. Over a 4 year period cyst has increased 1 centimeter in size.  We discussed a MRI versus repeat renal US in 2-3 year to assess interval growth rate.   Patient would like to have a renal US in 2 years.    [facility] [place] [address], [place], [place] [address] [phone]  I, Selena Batten, am acting as a scribe for Dr. Hollice Espy.  I have reviewed the above documentation for accuracy and completeness, and I agree with the above.   Hollice Espy, MD

## 2020-06-15 ENCOUNTER — Encounter: Payer: Self-pay | Admitting: Urology

## 2020-06-15 ENCOUNTER — Other Ambulatory Visit: Payer: Self-pay

## 2020-06-15 ENCOUNTER — Ambulatory Visit (INDEPENDENT_AMBULATORY_CARE_PROVIDER_SITE_OTHER): Payer: BC Managed Care – PPO | Admitting: Urology

## 2020-06-15 ENCOUNTER — Other Ambulatory Visit (INDEPENDENT_AMBULATORY_CARE_PROVIDER_SITE_OTHER): Payer: BC Managed Care – PPO

## 2020-06-15 VITALS — BP 161/85 | HR 76 | Ht 62.0 in | Wt 233.0 lb

## 2020-06-15 DIAGNOSIS — N281 Cyst of kidney, acquired: Secondary | ICD-10-CM

## 2020-06-15 DIAGNOSIS — E119 Type 2 diabetes mellitus without complications: Secondary | ICD-10-CM | POA: Diagnosis not present

## 2020-06-15 DIAGNOSIS — E7800 Pure hypercholesterolemia, unspecified: Secondary | ICD-10-CM

## 2020-06-15 DIAGNOSIS — D72819 Decreased white blood cell count, unspecified: Secondary | ICD-10-CM

## 2020-06-15 LAB — HEPATIC FUNCTION PANEL
ALT: 79 U/L — ABNORMAL HIGH (ref 0–35)
AST: 53 U/L — ABNORMAL HIGH (ref 0–37)
Albumin: 4.3 g/dL (ref 3.5–5.2)
Alkaline Phosphatase: 61 U/L (ref 39–117)
Bilirubin, Direct: 0.1 mg/dL (ref 0.0–0.3)
Total Bilirubin: 0.4 mg/dL (ref 0.2–1.2)
Total Protein: 6.9 g/dL (ref 6.0–8.3)

## 2020-06-15 LAB — BASIC METABOLIC PANEL
BUN: 20 mg/dL (ref 6–23)
CO2: 26 mEq/L (ref 19–32)
Calcium: 9.2 mg/dL (ref 8.4–10.5)
Chloride: 99 mEq/L (ref 96–112)
Creatinine, Ser: 0.89 mg/dL (ref 0.40–1.20)
GFR: 64.79 mL/min (ref >60.00)
Glucose, Bld: 105 mg/dL — ABNORMAL HIGH (ref 70–99)
Potassium: 4.3 mEq/L (ref 3.5–5.1)
Sodium: 134 mEq/L — ABNORMAL LOW (ref 135–145)

## 2020-06-15 LAB — CBC WITH DIFFERENTIAL/PLATELET
Basophils Absolute: 0 10*3/uL (ref 0.0–0.1)
Basophils Relative: 1 % (ref 0.0–3.0)
Eosinophils Absolute: 0.2 10*3/uL (ref 0.0–0.7)
Eosinophils Relative: 4.5 % (ref 0.0–5.0)
HCT: 41.3 % (ref 36.0–46.0)
Hemoglobin: 13.7 g/dL (ref 12.0–15.0)
Lymphocytes Relative: 29.2 % (ref 12.0–46.0)
Lymphs Abs: 1.1 10*3/uL (ref 0.7–4.0)
MCHC: 33.1 g/dL (ref 30.0–36.0)
MCV: 83.9 fl (ref 78.0–100.0)
Monocytes Absolute: 0.3 10*3/uL (ref 0.1–1.0)
Monocytes Relative: 7.9 % (ref 3.0–12.0)
Neutro Abs: 2.2 10*3/uL (ref 1.4–7.7)
Neutrophils Relative %: 57.4 % (ref 43.0–77.0)
Platelets: 179 10*3/uL (ref 150.0–400.0)
RBC: 4.93 Mil/uL (ref 3.87–5.11)
RDW: 13.6 % (ref 11.5–15.5)
WBC: 3.8 10*3/uL — ABNORMAL LOW (ref 4.0–10.5)

## 2020-06-15 LAB — LIPID PANEL
Cholesterol: 188 mg/dL (ref 0–200)
HDL: 43.2 mg/dL (ref >39.00)
LDL Cholesterol: 114 mg/dL — ABNORMAL HIGH (ref 0–99)
NonHDL: 144.62
Total CHOL/HDL Ratio: 4
Triglycerides: 151 mg/dL — ABNORMAL HIGH (ref 0.0–149.0)
VLDL: 30.2 mg/dL (ref 0.0–40.0)

## 2020-06-15 LAB — HEMOGLOBIN A1C: Hgb A1c MFr Bld: 6.4 % (ref 4.6–6.5)

## 2020-06-15 LAB — MICROALBUMIN / CREATININE URINE RATIO
Creatinine,U: 77.1 mg/dL
Microalb Creat Ratio: 1.9 mg/g (ref 0.0–30.0)
Microalb, Ur: 1.5 mg/dL (ref 0.0–1.9)

## 2020-06-21 ENCOUNTER — Other Ambulatory Visit: Payer: Self-pay

## 2020-06-21 ENCOUNTER — Encounter: Payer: Self-pay | Admitting: Internal Medicine

## 2020-06-21 ENCOUNTER — Ambulatory Visit (INDEPENDENT_AMBULATORY_CARE_PROVIDER_SITE_OTHER): Payer: BC Managed Care – PPO | Admitting: Internal Medicine

## 2020-06-21 VITALS — BP 120/70 | HR 81 | Temp 98.2°F | Resp 16 | Ht 62.0 in | Wt 230.0 lb

## 2020-06-21 DIAGNOSIS — D72819 Decreased white blood cell count, unspecified: Secondary | ICD-10-CM

## 2020-06-21 DIAGNOSIS — E559 Vitamin D deficiency, unspecified: Secondary | ICD-10-CM

## 2020-06-21 DIAGNOSIS — F439 Reaction to severe stress, unspecified: Secondary | ICD-10-CM | POA: Diagnosis not present

## 2020-06-21 DIAGNOSIS — R945 Abnormal results of liver function studies: Secondary | ICD-10-CM

## 2020-06-21 DIAGNOSIS — E1165 Type 2 diabetes mellitus with hyperglycemia: Secondary | ICD-10-CM

## 2020-06-21 DIAGNOSIS — I10 Essential (primary) hypertension: Secondary | ICD-10-CM

## 2020-06-21 DIAGNOSIS — Z23 Encounter for immunization: Secondary | ICD-10-CM

## 2020-06-21 DIAGNOSIS — R195 Other fecal abnormalities: Secondary | ICD-10-CM

## 2020-06-21 DIAGNOSIS — E7800 Pure hypercholesterolemia, unspecified: Secondary | ICD-10-CM

## 2020-06-21 DIAGNOSIS — N281 Cyst of kidney, acquired: Secondary | ICD-10-CM | POA: Diagnosis not present

## 2020-06-21 NOTE — Progress Notes (Signed)
[name], female   DOB: Jun 02, 1961, 59 y.o.   MRN: [id number]

## 2020-06-21 NOTE — Progress Notes (Signed)
[name], female   DOB: August 10, 1961, 59 y.o.   MRN: [id number]   Subjective:    [name], female    DOB: 04-Apr-1961, 59 y.o.   MRN: [id number]  HPI This visit occurred during the SARS-CoV-2 public health emergency.  Safety protocols were in place, including screening questions prior to the visit, additional usage of staff PPE, and extensive cleaning of exam room while observing appropriate contact time as indicated for disinfecting solutions.  Patient here for a scheduled follow up.  Saw GI for f/u 06/12/20 - f/u NASH - stage 3 fibrosis.  Discussed diet and exercise and recommended a f/u ultrasound in 07/2020.  She has adjusted her diet.  Lost some weight.  Sugars improved.  Triglycerides improved.  Some occasional loose stool.  States occurs once q 2 weeks.  Will monitor for triggers.  Was questioning if related to metformin.  No chest pain or sob.  No abdominal pain.  Recently saw urology.  F/u renal cyst.  Back - tires - when active.  No radicular symptoms. Discussed exercise and stretches.  Desires no further w/up at this time.  Will notify me if symptoms change or persistent problem.  Can consider PT, etc.    Past Medical History:  Diagnosis Date  . Abnormal liver function   . Allergy   . Anemia   . GERD (gastroesophageal reflux disease)   . Heart murmur   . Hypercholesterolemia   . Hyperglycemia   . Hypertension    Past Surgical History:  Procedure Laterality Date  . ABDOMINAL HYSTERECTOMY  2010  . ACHILLES TENDON REPAIR  2009  . BREAST BIOPSY Left 07/30/2006   benign  . BREAST BIOPSY Left 02/18/2007 & 03/05/2007   benign  . BREAST EXCISIONAL BIOPSY    . BREAST SURGERY  2008  . LUMBAR MICRODISCECTOMY  2004  . ROTATOR CUFF REPAIR  2006  . SEPTOPLASTY  1990  . TONSILLECTOMY AND ADENOIDECTOMY  1969   Family History  Problem Relation Age of Onset  . Hyperlipidemia Mother   . Hypertension Mother   . Stroke Mother   . Heart disease Mother   .  Hypothyroidism Mother   . Pernicious anemia Mother        aunt x 2  . Hyperlipidemia Maternal Grandmother   . Ulcers Maternal Grandmother   . Lung cancer Father        Hx smoker  . Hypertension Maternal Grandfather        MI  . Heart disease Maternal Grandfather        myocardial infarction  . Kidney cancer Paternal Grandmother   . Ovarian cancer Maternal Aunt   . Breast cancer Paternal Aunt   . Diabetes Other   . Hypothyroidism Other        aunt x 2  . Colon cancer Neg Hx    Social History   Socioeconomic History  . Marital status: Single    Spouse name: Not on file  . Number of children: 0  . Years of education: Not on file  . Highest education level: Not on file  Occupational History  . Not on file  Tobacco Use  . Smoking status: Never Smoker  . Smokeless tobacco: Never Used  Vaping Use  . Vaping Use: Never used  Substance and Sexual Activity  . Alcohol use: Yes    Alcohol/week: 0.0 standard drinks    Comment: rare  . Drug use: No  . Sexual activity: Not  on file  Other Topics Concern  . Not on file  Social History Narrative   She is single and has no children. She works at [employer] as an Web designer.    Social Determinants of Health   Financial Resource Strain: Low Risk   . Difficulty of Paying Living Expenses: Not hard at all  Food Insecurity:   . Worried About Charity fundraiser in the Last Year: Not on file  . Ran Out of Food in the Last Year: Not on file  Transportation Needs:   . Lack of Transportation (Medical): Not on file  . Lack of Transportation (Non-Medical): Not on file  Physical Activity:   . Days of Exercise per Week: Not on file  . Minutes of Exercise per Session: Not on file  Stress:   . Feeling of Stress : Not on file  Social Connections:   . Frequency of Communication with Friends and Family: Not on file  . Frequency of Social Gatherings with Friends and Family: Not on file  . Attends Religious Services:  Not on file  . Active Member of Clubs or Organizations: Not on file  . Attends Archivist Meetings: Not on file  . Marital Status: Not on file    Outpatient Encounter Medications as of 06/21/2020  Medication Sig  . albuterol (PROVENTIL HFA;VENTOLIN HFA) 108 (90 Base) MCG/ACT inhaler Inhale 2 puffs into the lungs every 6 (six) hours as needed for wheezing or shortness of breath.  Marland Kitchen atorvastatin (LIPITOR) 10 MG tablet Take 1 tablet (10 mg total) by mouth daily.  . Cholecalciferol (VITAMIN D3) 1000 units CAPS Take 1 capsule by mouth daily.   . Cyanocobalamin (B-12) 250 MCG TABS Take 250 mcg by mouth daily.  . fluticasone (FLONASE) 50 MCG/ACT nasal spray Place 2 sprays into the nose as needed for rhinitis.  Marland Kitchen lisinopril (ZESTRIL) 20 MG tablet Take 1 tablet (20 mg total) by mouth daily.  Marland Kitchen loratadine (CLARITIN) 10 MG tablet Take 10 mg by mouth daily.  . metFORMIN (GLUCOPHAGE XR) 500 MG 24 hr tablet Take two tablets bid  . vitamin E 400 UNIT capsule Take 400 Units by mouth daily.   No facility-administered encounter medications on file as of 06/21/2020.    Review of Systems  Constitutional: Negative for appetite change and unexpected weight change.  HENT: Negative for congestion and sinus pressure.   Respiratory: Negative for cough, chest tightness and shortness of breath.   Cardiovascular: Negative for chest pain, palpitations and leg swelling.  Gastrointestinal: Negative for nausea and vomiting.       Intermittent loose stool as outlined.   Genitourinary: Negative for difficulty urinating and dysuria.  Musculoskeletal: Negative for joint swelling and myalgias.  Neurological: Negative for dizziness, light-headedness and headaches.  Psychiatric/Behavioral: Negative for agitation and dysphoric mood.       Objective:    Physical Exam Vitals reviewed.  Constitutional:      General: She is not in acute distress.    Appearance: Normal appearance.  HENT:     Head: Normocephalic  and atraumatic.     Right Ear: External ear normal.     Left Ear: External ear normal.  Eyes:     General: No scleral icterus.       Right eye: No discharge.        Left eye: No discharge.     Conjunctiva/sclera: Conjunctivae normal.  Neck:     Thyroid: No thyromegaly.  Cardiovascular:  Rate and Rhythm: Normal rate and regular rhythm.  Pulmonary:     Effort: No respiratory distress.     Breath sounds: Normal breath sounds. No wheezing.  Abdominal:     General: Bowel sounds are normal.     Palpations: Abdomen is soft.     Tenderness: There is no abdominal tenderness.  Musculoskeletal:        General: No swelling or tenderness.     Cervical back: Neck supple. No tenderness.  Lymphadenopathy:     Cervical: No cervical adenopathy.  Skin:    Findings: No erythema or rash.  Neurological:     Mental Status: She is alert.  Psychiatric:        Mood and Affect: Mood normal.        Behavior: Behavior normal.     BP 120/70   Pulse 81   Temp 98.2 F (36.8 C) (Oral)   Resp 16   Ht _0  (1.575 m)   Wt 230 lb (104.3 kg)   SpO2 98%   BMI 42.07 kg/m  Wt Readings from Last 3 Encounters:  06/21/20 230 lb (104.3 kg)  06/15/20 233 lb (105.7 kg)  05/03/20 233 lb 12.8 oz (106.1 kg)     Lab Results  Component Value Date   WBC 3.8 (L) 06/15/2020   HGB 13.7 06/15/2020   HCT 41.3 06/15/2020   PLT 179.0 06/15/2020   GLUCOSE 105 (H) 06/15/2020   CHOL 188 06/15/2020   TRIG 151.0 (H) 06/15/2020   HDL 43.20 06/15/2020   LDLDIRECT 167.0 02/15/2020   LDLCALC 114 (H) 06/15/2020   ALT 79 (H) 06/15/2020   AST 53 (H) 06/15/2020   NA 134 (L) 06/15/2020   K 4.3 06/15/2020   CL 99 06/15/2020   CREATININE 0.89 06/15/2020   BUN 20 06/15/2020   CO2 26 06/15/2020   TSH 3.22 11/15/2019   INR 1.0 07/13/2017   HGBA1C 6.4 06/15/2020   MICROALBUR 1.5 06/15/2020    US Abdomen Complete  Result Date: 01/24/2020 CLINICAL DATA:  Nonalcoholic steatohepatitis. EXAM: ABDOMEN ULTRASOUND  COMPLETE COMPARISON:  12/04/2015 complete abdominal ultrasound. FINDINGS: Gallbladder: No wall thickening visualized. Layering calcific densities within the gallbladder measured up to 11 mm. No sonographic Murphy sign noted by sonographer. Common bile duct: Diameter: 5.4 mm Liver: No focal lesion identified. Increased parenchymal echogenicity with a coarse echotexture. Portal vein is patent on color Doppler imaging with normal direction of blood flow towards the liver. IVC: No abnormality visualized. Pancreas: Visualized portion unremarkable. Spleen: Size and appearance within normal limits, measuring 11.2 cm. Right Kidney: Length: 10.8 cm. Echogenicity within normal limits. 3.3 x 1.7 x 2.6 cm septated right interpole cyst without internal flow on Doppler imaging. No hydronephrosis visualized. Left Kidney: Length: 11.8 cm. Echogenicity within normal limits. No mass or hydronephrosis visualized. Abdominal aorta: No aneurysm visualized. Other findings: None. IMPRESSION: Hepatic steatosis with coarse echotexture.  No focal hepatic lesion. Cholelithiasis without cholecystitis. 3.3 cm interpolar right renal cyst, mildly increased in size and likely benign. Electronically Signed   By: Primitivo Gauze M.D.   On: 01/24/2020 19:24       Assessment & Plan:   Problem List Items Addressed This Visit    Vitamin D deficiency    Follow vitamin D level.        Relevant Orders   VITAMIN D 25 Hydroxy (Vit-D Deficiency, Fractures)   Type 2 diabetes mellitus with hyperglycemia (HCC)    Discussed low carb diet and exercise.  She has adjusted  her diet.  On metformin.  a1c just checked improved - 6.4.  Discussed importance of good sugar control in the setting of history of NASH.  Follow met b and a1c.       Relevant Orders   Hemoglobin A1c   Stress    Overall appears to be doing well.  Follow.  Counselor.        Renal cyst    Saw urology (Dr Erlene Quan) 06/15/20.  Note reviewed.  Recommended f/u renal ultrasound in  2 years.        Loose stools    Intermittent.  May only occur once q 2 weeks.  Will monitor for triggers.  Continue metformin XR.  Fiber.  Follow.       Leukopenia    Slightly decreased white count.  Recheck cbc with next labs.       Relevant Orders   CBC with Differential/Platelet   Hypertension    Blood pressure as outlined.  Doing well.  Continue lisinopril.  Follow pressures. Follow metabolic panel.       Relevant Orders   TSH   Basic metabolic panel   Hypercholesteremia    On lipitor.  Low cholesterol diet and exercise.  Follow lipid panel and liver function tests.        Relevant Orders   Lipid panel   Abnormal liver function    S/p liver biopsy - NASH.  Being followed by GI.  Discussed importance of continued diet and exercise and weight loss.  Follow liver function tests.  Most recent check - improved.   Lab Results  Component Value Date   ALT 79 (H) 06/15/2020   AST 53 (H) 06/15/2020   ALKPHOS 61 06/15/2020   BILITOT 0.4 06/15/2020        Relevant Orders   Hepatic function panel    Other Visit Diagnoses    Need for immunization against influenza    -  Primary   Relevant Orders   Flu Vaccine QUAD 36+ mos IM (Completed)       Einar Pheasant, MD

## 2020-07-01 ENCOUNTER — Encounter: Payer: Self-pay | Admitting: Internal Medicine

## 2020-07-01 DIAGNOSIS — R195 Other fecal abnormalities: Secondary | ICD-10-CM | POA: Insufficient documentation

## 2020-07-01 DIAGNOSIS — D72819 Decreased white blood cell count, unspecified: Secondary | ICD-10-CM | POA: Insufficient documentation

## 2020-07-01 NOTE — Assessment & Plan Note (Signed)
On lipitor.  Low cholesterol diet and exercise.  Follow lipid panel and liver function tests.   

## 2020-07-01 NOTE — Assessment & Plan Note (Signed)
S/p liver biopsy - NASH.  Being followed by GI.  Discussed importance of continued diet and exercise and weight loss.  Follow liver function tests.  Most recent check - improved.   Lab Results  Component Value Date   ALT 79 (H) 06/15/2020   AST 53 (H) 06/15/2020   ALKPHOS 61 06/15/2020   BILITOT 0.4 06/15/2020

## 2020-07-01 NOTE — Assessment & Plan Note (Signed)
Intermittent.  May only occur once q 2 weeks.  Will monitor for triggers.  Continue metformin XR.  Fiber.  Follow.

## 2020-07-01 NOTE — Assessment & Plan Note (Signed)
Overall appears to be doing well.  Follow.  Counselor.

## 2020-07-01 NOTE — Assessment & Plan Note (Signed)
Slightly decreased white count.  Recheck cbc with next labs.

## 2020-07-01 NOTE — Assessment & Plan Note (Signed)
Discussed low carb diet and exercise.  She has adjusted her diet.  On metformin.  a1c just checked improved - 6.4.  Discussed importance of good sugar control in the setting of history of NASH.  Follow met b and a1c.

## 2020-07-01 NOTE — Assessment & Plan Note (Signed)
Saw urology (Dr Erlene Quan) 06/15/20.  Note reviewed.  Recommended f/u renal ultrasound in 2 years.

## 2020-07-01 NOTE — Assessment & Plan Note (Signed)
Blood pressure as outlined.  Doing well.  Continue lisinopril.  Follow pressures. Follow metabolic panel.

## 2020-07-01 NOTE — Assessment & Plan Note (Signed)
Follow vitamin D level.

## 2020-07-02 ENCOUNTER — Encounter: Payer: Self-pay | Admitting: Urology

## 2020-07-19 ENCOUNTER — Ambulatory Visit
Admission: RE | Admit: 2020-07-19 | Discharge: 2020-07-19 | Disposition: A | Discharge status: Home / Self Care | Payer: BC Managed Care – PPO | Source: Ambulatory Visit | Attending: Gastroenterology | Admitting: Gastroenterology

## 2020-07-19 ENCOUNTER — Other Ambulatory Visit: Payer: Self-pay

## 2020-07-19 DIAGNOSIS — K7581 Nonalcoholic steatohepatitis (NASH): Secondary | ICD-10-CM | POA: Diagnosis present

## 2020-07-23 ENCOUNTER — Other Ambulatory Visit: Payer: Self-pay | Admitting: Internal Medicine

## 2020-07-24 ENCOUNTER — Telehealth: Payer: BC Managed Care – PPO

## 2020-07-30 ENCOUNTER — Ambulatory Visit: Payer: BC Managed Care – PPO | Admitting: Pharmacist

## 2020-07-30 DIAGNOSIS — E7800 Pure hypercholesterolemia, unspecified: Secondary | ICD-10-CM

## 2020-07-30 DIAGNOSIS — E1165 Type 2 diabetes mellitus with hyperglycemia: Secondary | ICD-10-CM

## 2020-07-30 DIAGNOSIS — I10 Essential (primary) hypertension: Secondary | ICD-10-CM

## 2020-07-30 NOTE — Progress Notes (Deleted)
Clinical ASCVD: {YES/NO:21197} The 10-year ASCVD risk score Mikey Bussing DC Jr., et al., 2013) is: 7.6%   Values used to calculate the score:     Age: 59 years     Sex: Female     Is Non-Hispanic African American: No     Diabetic: Yes     Tobacco smoker: No     Systolic Blood Pressure: 643 mmHg     Is BP treated: Yes     HDL Cholesterol: 43.2 mg/dL     Total Cholesterol: 188 mg/dL
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
## 2020-07-30 NOTE — Patient Instructions (Signed)
Visit Information  Patient Care Plan: Medication Management    Problem Identified: Diabetes, NASH     Long-Range Goal: Glycemic Management Optimized   This Visit's Progress: On track  Recent Progress: On track  Priority: High  Note:   Current Barriers:  . Suboptimal therapeutic regimen for diabetes with fatty liver disease  Pharmacist Clinical Goal(s):  Marland Kitchen Over the next 30 days, patient will maintain control of diabetes through collaboration with PharmD and provider.   Interventions: . Inter-disciplinary care team collaboration (see longitudinal plan of care) . Comprehensive medication review performed; medication list updated in electronic medical record  Diabetes: . Controlled; current treatment: metformin 1000 mg BID  . Reports weight loss ~ 10 lbs, but worries about maintaining this going into the holiday season . Praised for improvement in A1c.  . Discussed benefit of GLP1 for weight loss, therefore fatty liver disease. Discussed that we could pursue coverage and use of weekly Wegovy, and if not covered, use of Ozempic. Discussed benefits, side effects. Patient will review her insurance formulary and reach back out if she is interested  Hyperlipidemia: . Uncontrolled; current treatment: atorvastatin 10 mg TIW. Reports previously on daily atorvastatin, but was reduced to TIW and had reduction in LFTs.  . Medications previously tried: atorvastatin 10 mg daily  . Reviewed goal moderate intensity statin therapy with likely goal LDL <70 given diabetes and additional risk factors. Discussed that we could try increasing atorvastatin or alternative statin agent, such as rosuvastatin, and monitor LFTs. She will continue to discuss with Dr. Nicki Reaper and GI provider moving forward.   Hypertension: . Controlled; current treatment: lisinopril 20 mg daily . Recommended to continue current regimen  Patient Goals/Self-Care Activities . Over the next 30 days, patient will:  - continue to focus  on medication adherence   Follow Up Plan:  patient will contact me via MyChart if she is interested in continued medication therapy management, after she reviews her insurance formulary.                 The patient verbalized understanding of instructions, educational materials, and care plan provided today and declined offer to receive copy of patient instructions, educational materials, and care plan.   Plan: Closing care management case unless patient reaches back out to me  Catie Darnelle Maffucci, PharmD, [place], [place] [phone]

## 2020-10-18 ENCOUNTER — Other Ambulatory Visit: Payer: Self-pay

## 2020-10-18 ENCOUNTER — Other Ambulatory Visit (INDEPENDENT_AMBULATORY_CARE_PROVIDER_SITE_OTHER): Payer: BC Managed Care – PPO

## 2020-10-18 DIAGNOSIS — E1165 Type 2 diabetes mellitus with hyperglycemia: Secondary | ICD-10-CM

## 2020-10-18 DIAGNOSIS — E7800 Pure hypercholesterolemia, unspecified: Secondary | ICD-10-CM | POA: Diagnosis not present

## 2020-10-18 DIAGNOSIS — D72819 Decreased white blood cell count, unspecified: Secondary | ICD-10-CM

## 2020-10-18 DIAGNOSIS — R945 Abnormal results of liver function studies: Secondary | ICD-10-CM

## 2020-10-18 DIAGNOSIS — I10 Essential (primary) hypertension: Secondary | ICD-10-CM

## 2020-10-18 DIAGNOSIS — E559 Vitamin D deficiency, unspecified: Secondary | ICD-10-CM | POA: Diagnosis not present

## 2020-10-18 LAB — CBC WITH DIFFERENTIAL/PLATELET
Basophils Absolute: 0 10*3/uL (ref 0.0–0.1)
Basophils Relative: 0.8 % (ref 0.0–3.0)
Eosinophils Absolute: 0.2 10*3/uL (ref 0.0–0.7)
Eosinophils Relative: 4.4 % (ref 0.0–5.0)
HCT: 41.3 % (ref 36.0–46.0)
Hemoglobin: 13.9 g/dL (ref 12.0–15.0)
Lymphocytes Relative: 29 % (ref 12.0–46.0)
Lymphs Abs: 1 10*3/uL (ref 0.7–4.0)
MCHC: 33.7 g/dL (ref 30.0–36.0)
MCV: 82.8 fl (ref 78.0–100.0)
Monocytes Absolute: 0.3 10*3/uL (ref 0.1–1.0)
Monocytes Relative: 8.7 % (ref 3.0–12.0)
Neutro Abs: 2 10*3/uL (ref 1.4–7.7)
Neutrophils Relative %: 57.1 % (ref 43.0–77.0)
Platelets: 205 10*3/uL (ref 150.0–400.0)
RBC: 4.99 Mil/uL (ref 3.87–5.11)
RDW: 13.8 % (ref 11.5–15.5)
WBC: 3.6 10*3/uL — ABNORMAL LOW (ref 4.0–10.5)

## 2020-10-18 LAB — BASIC METABOLIC PANEL
BUN: 21 mg/dL (ref 6–23)
CO2: 29 mEq/L (ref 19–32)
Calcium: 9.9 mg/dL (ref 8.4–10.5)
Chloride: 99 mEq/L (ref 96–112)
Creatinine, Ser: 0.91 mg/dL (ref 0.40–1.20)
GFR: 68.85 mL/min (ref >60.00)
Glucose, Bld: 115 mg/dL — ABNORMAL HIGH (ref 70–99)
Potassium: 4.3 mEq/L (ref 3.5–5.1)
Sodium: 135 mEq/L (ref 135–145)

## 2020-10-18 LAB — LIPID PANEL
Cholesterol: 195 mg/dL (ref 0–200)
HDL: 40.4 mg/dL (ref >39.00)
LDL Cholesterol: 117 mg/dL — ABNORMAL HIGH (ref 0–99)
NonHDL: 154.25
Total CHOL/HDL Ratio: 5
Triglycerides: 185 mg/dL — ABNORMAL HIGH (ref 0.0–149.0)
VLDL: 37 mg/dL (ref 0.0–40.0)

## 2020-10-18 LAB — HEPATIC FUNCTION PANEL
ALT: 128 U/L — ABNORMAL HIGH (ref 0–35)
AST: 87 U/L — ABNORMAL HIGH (ref 0–37)
Albumin: 4.4 g/dL (ref 3.5–5.2)
Alkaline Phosphatase: 58 U/L (ref 39–117)
Bilirubin, Direct: 0.1 mg/dL (ref 0.0–0.3)
Total Bilirubin: 0.6 mg/dL (ref 0.2–1.2)
Total Protein: 7.3 g/dL (ref 6.0–8.3)

## 2020-10-18 LAB — VITAMIN D 25 HYDROXY (VIT D DEFICIENCY, FRACTURES): VITD: 34.55 ng/mL (ref 30.00–100.00)

## 2020-10-18 LAB — TSH: TSH: 3.33 u[IU]/mL (ref 0.35–4.50)

## 2020-10-18 LAB — HEMOGLOBIN A1C: Hgb A1c MFr Bld: 6.1 % (ref 4.6–6.5)

## 2020-10-22 ENCOUNTER — Other Ambulatory Visit: Payer: Self-pay

## 2020-10-22 ENCOUNTER — Encounter: Payer: Self-pay | Admitting: Internal Medicine

## 2020-10-22 ENCOUNTER — Ambulatory Visit: Payer: BC Managed Care – PPO | Admitting: Internal Medicine

## 2020-10-22 VITALS — BP 124/72 | HR 90 | Temp 98.6°F | Resp 16 | Ht 62.0 in | Wt 227.2 lb

## 2020-10-22 DIAGNOSIS — D649 Anemia, unspecified: Secondary | ICD-10-CM

## 2020-10-22 DIAGNOSIS — Z6841 Body Mass Index (BMI) 40.0 and over, adult: Secondary | ICD-10-CM

## 2020-10-22 DIAGNOSIS — F439 Reaction to severe stress, unspecified: Secondary | ICD-10-CM

## 2020-10-22 DIAGNOSIS — E7800 Pure hypercholesterolemia, unspecified: Secondary | ICD-10-CM

## 2020-10-22 DIAGNOSIS — E1165 Type 2 diabetes mellitus with hyperglycemia: Secondary | ICD-10-CM | POA: Diagnosis not present

## 2020-10-22 DIAGNOSIS — D72819 Decreased white blood cell count, unspecified: Secondary | ICD-10-CM

## 2020-10-22 DIAGNOSIS — R945 Abnormal results of liver function studies: Secondary | ICD-10-CM | POA: Diagnosis not present

## 2020-10-22 DIAGNOSIS — I10 Essential (primary) hypertension: Secondary | ICD-10-CM | POA: Diagnosis not present

## 2020-10-22 LAB — HEPATIC FUNCTION PANEL
ALT: 107 U/L — ABNORMAL HIGH (ref 0–35)
AST: 57 U/L — ABNORMAL HIGH (ref 0–37)
Albumin: 4.5 g/dL (ref 3.5–5.2)
Alkaline Phosphatase: 65 U/L (ref 39–117)
Bilirubin, Direct: 0.1 mg/dL (ref 0.0–0.3)
Total Bilirubin: 0.3 mg/dL (ref 0.2–1.2)
Total Protein: 7 g/dL (ref 6.0–8.3)

## 2020-10-22 LAB — HM DIABETES FOOT EXAM

## 2020-10-22 NOTE — Progress Notes (Signed)
[name], female   DOB: 1961/05/31, 60 y.o.   MRN: [id number]   Subjective:    [name], female    DOB: Mar 21, 1961, 60 y.o.   MRN: [id number]  HPI This visit occurred during the SARS-CoV-2 public health emergency.  Safety protocols were in place, including screening questions prior to the visit, additional usage of staff PPE, and extensive cleaning of exam room while observing appropriate contact time as indicated for disinfecting solutions.  Patient here for a scheduled follow up.  Here to follow up regarding her liver function tests, blood sugars and cholesterol. She reports she is doing relatively well.  Trying to stay active. Watching her diet.  Has lost weight.  No chest pain or sob reported.  No abdominal pain.  Some loose stools.   Can modify what she eats and take metformin and bowels are better.  Handling stress.  Discussed labs.  Had reaction to covid booster - 103.9 temp and persistent intermittent fever for one week.  Resolved now.  AM sugars averaging <120.     Past Medical History:  Diagnosis Date  . Abnormal liver function   . Allergy   . Anemia   . GERD (gastroesophageal reflux disease)   . Heart murmur   . Hypercholesterolemia   . Hyperglycemia   . Hypertension    Past Surgical History:  Procedure Laterality Date  . ABDOMINAL HYSTERECTOMY  2010  . ACHILLES TENDON REPAIR  2009  . BREAST BIOPSY Left 07/30/2006   benign  . BREAST BIOPSY Left 02/18/2007 & 03/05/2007   benign  . BREAST EXCISIONAL BIOPSY    . BREAST SURGERY  2008  . LUMBAR MICRODISCECTOMY  2004  . ROTATOR CUFF REPAIR  2006  . SEPTOPLASTY  1990  . TONSILLECTOMY AND ADENOIDECTOMY  1969   Family History  Problem Relation Age of Onset  . Hyperlipidemia Mother   . Hypertension Mother   . Stroke Mother   . Heart disease Mother   . Hypothyroidism Mother   . Pernicious anemia Mother        aunt x 2  . Hyperlipidemia Maternal Grandmother   . Ulcers Maternal  Grandmother   . Lung cancer Father        Hx smoker  . Hypertension Maternal Grandfather        MI  . Heart disease Maternal Grandfather        myocardial infarction  . Kidney cancer Paternal Grandmother   . Ovarian cancer Maternal Aunt   . Breast cancer Paternal Aunt   . Diabetes Other   . Hypothyroidism Other        aunt x 2  . Colon cancer Neg Hx    Social History   Socioeconomic History  . Marital status: Single    Spouse name: Not on file  . Number of children: 0  . Years of education: Not on file  . Highest education level: Not on file  Occupational History  . Not on file  Tobacco Use  . Smoking status: Never Smoker  . Smokeless tobacco: Never Used  Vaping Use  . Vaping Use: Never used  Substance and Sexual Activity  . Alcohol use: Yes    Alcohol/week: 0.0 standard drinks    Comment: rare  . Drug use: No  . Sexual activity: Not on file  Other Topics Concern  . Not on file  Social History Narrative   She is single and has no children. 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    Social Determinants of Health   Financial Resource Strain: Low Risk   . Difficulty of Paying Living Expenses: Not hard at all  Food Insecurity: Not on file  Transportation Needs: Not on file  Physical Activity: Not on file  Stress: Not on file  Social Connections: Not on file    Outpatient Encounter Medications as of 10/22/2020  Medication Sig  . albuterol (PROVENTIL HFA;VENTOLIN HFA) 108 (90 Base) MCG/ACT inhaler Inhale 2 puffs into the lungs every 6 (six) hours as needed for wheezing or shortness of breath.  Marland Kitchen atorvastatin (LIPITOR) 10 MG tablet Take 1 tablet (10 mg total) by mouth daily.  . Cholecalciferol (VITAMIN D3) 1000 units CAPS Take 1 capsule by mouth daily.   . Cyanocobalamin (B-12) 250 MCG TABS Take 250 mcg by mouth daily.  . fluticasone (FLONASE) 50 MCG/ACT nasal spray Place 2 sprays into the nose as needed for rhinitis.  Marland Kitchen loratadine  (CLARITIN) 10 MG tablet Take 10 mg by mouth daily.  . metFORMIN (GLUCOPHAGE XR) 500 MG 24 hr tablet Take two tablets bid  . vitamin E 400 UNIT capsule Take 400 Units by mouth daily.  . [DISCONTINUED] lisinopril (ZESTRIL) 20 MG tablet Take 1 tablet (20 mg total) by mouth daily.   No facility-administered encounter medications on file as of 10/22/2020.    Review of Systems  Constitutional: Negative for appetite change and unexpected weight change.       Has lost some weight.   HENT: Negative for congestion and sinus pressure.   Respiratory: Negative for cough, chest tightness and shortness of breath.   Cardiovascular: Negative for chest pain, palpitations and leg swelling.  Gastrointestinal: Negative for abdominal pain, nausea and vomiting.       Some loose stool as outlined.   Genitourinary: Negative for difficulty urinating and dysuria.  Musculoskeletal: Negative for joint swelling and myalgias.  Skin: Negative for color change and rash.  Neurological: Negative for dizziness, light-headedness and headaches.  Psychiatric/Behavioral: Negative for agitation and dysphoric mood.       Objective:    Physical Exam Vitals reviewed.  Constitutional:      General: She is not in acute distress.    Appearance: Normal appearance.  HENT:     Head: Normocephalic and atraumatic.     Right Ear: External ear normal.     Left Ear: External ear normal.     Mouth/Throat:     Mouth: Oropharynx is clear and moist.  Eyes:     General: No scleral icterus.       Right eye: No discharge.        Left eye: No discharge.     Conjunctiva/sclera: Conjunctivae normal.  Neck:     Thyroid: No thyromegaly.  Cardiovascular:     Rate and Rhythm: Normal rate and regular rhythm.  Pulmonary:     Effort: No respiratory distress.     Breath sounds: Normal breath sounds. No wheezing.  Abdominal:     General: Bowel sounds are normal.     Palpations: Abdomen is soft.     Tenderness: There is no abdominal  tenderness.  Musculoskeletal:        General: No swelling, tenderness or edema.     Cervical back: Neck supple. No tenderness.  Lymphadenopathy:     Cervical: No cervical adenopathy.  Skin:    Findings: No erythema or rash.  Neurological:     Mental Status: She is alert.  Psychiatric:  Mood and Affect: Mood normal.        Behavior: Behavior normal.     BP 124/72   Pulse 90   Temp 98.6 F (37 C) (Oral)   Resp 16   Ht 5' 2"  (1.575 m)   Wt 227 lb 3.2 oz (103.1 kg)   SpO2 98%   BMI 41.56 kg/m  Wt Readings from Last 3 Encounters:  10/22/20 227 lb 3.2 oz (103.1 kg)  06/21/20 230 lb (104.3 kg)  06/15/20 233 lb (105.7 kg)     Lab Results  Component Value Date   WBC 3.6 (L) 10/18/2020   HGB 13.9 10/18/2020   HCT 41.3 10/18/2020   PLT 205.0 10/18/2020   GLUCOSE 115 (H) 10/18/2020   CHOL 195 10/18/2020   TRIG 185.0 (H) 10/18/2020   HDL 40.40 10/18/2020   LDLDIRECT 167.0 02/15/2020   LDLCALC 117 (H) 10/18/2020   ALT 107 (H) 10/22/2020   AST 57 (H) 10/22/2020   NA 135 10/18/2020   K 4.3 10/18/2020   CL 99 10/18/2020   CREATININE 0.91 10/18/2020   BUN 21 10/18/2020   CO2 29 10/18/2020   TSH 3.33 10/18/2020   INR 1.0 07/13/2017   HGBA1C 6.1 10/18/2020   MICROALBUR 1.5 06/15/2020    US Abdomen Limited RUQ  Result Date: 07/20/2020 CLINICAL DATA:  60 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 EXAM: ULTRASOUND ABDOMEN LIMITED RIGHT UPPER QUADRANT COMPARISON:  01/24/2020 FINDINGS: Gallbladder: Echogenic structures in the gallbladder are suggestive for stones., largest measuring 0.9 cm. No wall thickening. Reportedly, the patient does not have a sonographic Murphy sign. Common bile duct: Diameter: 0.4 cm Liver: No focal liver lesion. Liver is echogenic and diffusely heterogeneous. Poor visualization of the internal architecture. Findings are compatible with steatosis. Portal vein is patent on color Doppler imaging with normal direction of blood flow towards the liver.  Other: Exophytic hypoechoic structure involving the right kidney in the interpolar region is suggestive for a cyst. Findings are similar to the prior examination. IMPRESSION: 1. Liver is echogenic and heterogeneous. Findings are compatible with steatosis. No focal liver lesion. 2. Cholelithiasis.  No biliary dilatation. Electronically Signed   By: Markus Daft M.D.   On: 07/20/2020 17:01       Assessment & Plan:   Problem List Items Addressed This Visit    Abnormal liver function    S/p liver biopsy - NASH.  Being followed by GI.  Recent elevation.  Has lost weight.  Recheck liver panel and AFP today.  Continue diet and weight loss.  Follow liver function tests.        Relevant Orders   Hepatic function panel (Completed)   AFP tumor marker (Completed)   Hepatic function panel   Anemia    Follow cbc.       BMI 40.0-44.9, adult ([place])    Has lost weight.  Discussed diet and exercise.  Follow.        Hypercholesteremia    On lipitor.  Low cholesterol diet and exercise.  Follow lipid panel and liver function tests.        Relevant Orders   Lipid panel   Hypertension    Continue lisinopril.  Blood pressure doing well.  Follow pressures.  Follow metabolic panel.       Relevant Orders   Basic metabolic panel   Leukopenia    White blood cell count just checked and stable 3.6.  Follow cbc.       Relevant Orders   CBC with Differential/Platelet  Stress    Overall appears to be handling things relatively well.  Follow.        Type 2 diabetes mellitus with hyperglycemia (HCC) - Primary    Has lost weight.  Discussed diet and exercise.  On metformin.  If adjust diet, no significant diarrhea.  Continue current medication for now.  Follow metb and a1c.        Relevant Orders   Hemoglobin A1c       Einar Pheasant, MD

## 2020-10-23 LAB — AFP TUMOR MARKER: AFP-Tumor Marker: 2.8 ng/mL

## 2020-10-26 ENCOUNTER — Other Ambulatory Visit: Payer: Self-pay | Admitting: Internal Medicine

## 2020-10-29 NOTE — Assessment & Plan Note (Signed)
Continue lisinopril.  Blood pressure doing well.  Follow pressures.  Follow metabolic panel.

## 2020-10-29 NOTE — Assessment & Plan Note (Signed)
Follow cbc.  

## 2020-10-29 NOTE — Assessment & Plan Note (Signed)
White blood cell count just checked and stable 3.6.  Follow cbc.

## 2020-10-29 NOTE — Assessment & Plan Note (Signed)
On lipitor.  Low cholesterol diet and exercise.  Follow lipid panel and liver function tests.   

## 2020-10-29 NOTE — Assessment & Plan Note (Signed)
S/p liver biopsy - NASH.  Being followed by GI.  Recent elevation.  Has lost weight.  Recheck liver panel and AFP today.  Continue diet and weight loss.  Follow liver function tests.

## 2020-10-29 NOTE — Assessment & Plan Note (Signed)
Overall appears to be handling things relatively well.  Follow.

## 2020-10-29 NOTE — Assessment & Plan Note (Signed)
Has lost weight.  Discussed diet and exercise.  On metformin.  If adjust diet, no significant diarrhea.  Continue current medication for now.  Follow metb and a1c.

## 2020-10-29 NOTE — Assessment & Plan Note (Signed)
Has lost weight.  Discussed diet and exercise.  Follow.

## 2020-11-09 ENCOUNTER — Encounter: Payer: Self-pay | Admitting: Internal Medicine

## 2020-11-09 ENCOUNTER — Other Ambulatory Visit: Payer: Self-pay | Admitting: Internal Medicine

## 2020-11-12 NOTE — Telephone Encounter (Signed)
I am not aware of this.  Can you check and let pt know.  Thanks

## 2020-11-12 NOTE — Telephone Encounter (Signed)
I called the patient to inform her that this is one of [place]'s programs to inform the patient of their prescription refill.

## 2020-11-12 NOTE — Telephone Encounter (Signed)
I am not

## 2021-01-11 ENCOUNTER — Other Ambulatory Visit: Payer: Self-pay | Admitting: Internal Medicine

## 2021-01-14 ENCOUNTER — Ambulatory Visit
Admission: EM | Admit: 2021-01-14 | Discharge: 2021-01-14 | Disposition: A | Discharge status: Home / Self Care | Payer: BC Managed Care – PPO | Attending: Family Medicine | Admitting: Family Medicine

## 2021-01-14 ENCOUNTER — Other Ambulatory Visit: Payer: Self-pay

## 2021-01-14 ENCOUNTER — Encounter: Payer: Self-pay | Admitting: Emergency Medicine

## 2021-01-14 DIAGNOSIS — R0981 Nasal congestion: Secondary | ICD-10-CM | POA: Diagnosis present

## 2021-01-14 DIAGNOSIS — J310 Chronic rhinitis: Secondary | ICD-10-CM

## 2021-01-14 DIAGNOSIS — Z7984 Long term (current) use of oral hypoglycemic drugs: Secondary | ICD-10-CM | POA: Insufficient documentation

## 2021-01-14 DIAGNOSIS — Z880 Allergy status to penicillin: Secondary | ICD-10-CM | POA: Insufficient documentation

## 2021-01-14 DIAGNOSIS — J329 Chronic sinusitis, unspecified: Secondary | ICD-10-CM | POA: Diagnosis not present

## 2021-01-14 DIAGNOSIS — Z887 Allergy status to serum and vaccine status: Secondary | ICD-10-CM | POA: Diagnosis not present

## 2021-01-14 DIAGNOSIS — Z79899 Other long term (current) drug therapy: Secondary | ICD-10-CM | POA: Diagnosis not present

## 2021-01-14 DIAGNOSIS — R519 Headache, unspecified: Secondary | ICD-10-CM | POA: Diagnosis present

## 2021-01-14 DIAGNOSIS — Z20822 Contact with and (suspected) exposure to covid-19: Secondary | ICD-10-CM | POA: Diagnosis not present

## 2021-01-14 LAB — SARS CORONAVIRUS 2 (TAT 6-24 HRS): SARS Coronavirus 2: NEGATIVE

## 2021-01-14 MED ORDER — PROMETHAZINE-DM 6.25-15 MG/5ML PO SYRP: 5.0000 mL | ORAL_SOLUTION | Freq: Four times a day (QID) | ORAL | 0 refills | Status: DC | PRN

## 2021-01-14 MED ORDER — SULFAMETHOXAZOLE-TRIMETHOPRIM 800-160 MG PO TABS: 1.0000 | ORAL_TABLET | Freq: Two times a day (BID) | ORAL | 0 refills | Status: AC

## 2021-01-14 MED ORDER — BENZONATATE 100 MG PO CAPS: 200.0000 mg | ORAL_CAPSULE | Freq: Three times a day (TID) | ORAL | 0 refills | Status: DC
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
## 2021-01-14 NOTE — Discharge Instructions (Addendum)
The Bactrim twice daily with food for 10 days for treatment of your sinusitis.  Perform sinus irrigation 2-3 times a day with a NeilMed sinus rinse kit and distilled water.  Do not use tap water.  You can use plain over-the-counter Mucinex every 6 hours to break up the stickiness of the mucus so your body can clear it.  Increase your oral fluid intake to thin out your mucus so that is also able for your body to clear more easily.  Take an over-the-counter probiotic, such as Culturelle-align-activia, 1 hour after each dose of antibiotic to prevent diarrhea.  If you develop any new or worsening symptoms return for reevaluation or see your primary care provider.

## 2021-01-14 NOTE — ED Triage Notes (Signed)
Pt presents today with c/o of "I think I have a sinus infection". She c/o of headache, sinus pressure, sore throat and cough. X 5 days.

## 2021-01-17 ENCOUNTER — Other Ambulatory Visit: Payer: Self-pay

## 2021-01-17 ENCOUNTER — Other Ambulatory Visit: Payer: BC Managed Care – PPO

## 2021-01-21 ENCOUNTER — Encounter: Payer: BC Managed Care – PPO | Admitting: Internal Medicine

## 2021-03-15 ENCOUNTER — Other Ambulatory Visit: Payer: Self-pay

## 2021-03-15 ENCOUNTER — Other Ambulatory Visit (INDEPENDENT_AMBULATORY_CARE_PROVIDER_SITE_OTHER): Payer: BC Managed Care – PPO

## 2021-03-15 DIAGNOSIS — R945 Abnormal results of liver function studies: Secondary | ICD-10-CM | POA: Diagnosis not present

## 2021-03-15 DIAGNOSIS — E7800 Pure hypercholesterolemia, unspecified: Secondary | ICD-10-CM

## 2021-03-15 DIAGNOSIS — E1165 Type 2 diabetes mellitus with hyperglycemia: Secondary | ICD-10-CM

## 2021-03-15 DIAGNOSIS — D72819 Decreased white blood cell count, unspecified: Secondary | ICD-10-CM | POA: Diagnosis not present

## 2021-03-15 DIAGNOSIS — I10 Essential (primary) hypertension: Secondary | ICD-10-CM

## 2021-03-15 LAB — LIPID PANEL
Cholesterol: 194 mg/dL (ref 0–200)
HDL: 45.7 mg/dL (ref >39.00)
LDL Cholesterol: 111 mg/dL — ABNORMAL HIGH (ref 0–99)
NonHDL: 148.11
Total CHOL/HDL Ratio: 4
Triglycerides: 188 mg/dL — ABNORMAL HIGH (ref 0.0–149.0)
VLDL: 37.6 mg/dL (ref 0.0–40.0)

## 2021-03-15 LAB — CBC WITH DIFFERENTIAL/PLATELET
Basophils Absolute: 0 10*3/uL (ref 0.0–0.1)
Basophils Relative: 0.8 % (ref 0.0–3.0)
Eosinophils Absolute: 0.2 10*3/uL (ref 0.0–0.7)
Eosinophils Relative: 5.3 % — ABNORMAL HIGH (ref 0.0–5.0)
HCT: 40.7 % (ref 36.0–46.0)
Hemoglobin: 13.8 g/dL (ref 12.0–15.0)
Lymphocytes Relative: 28.3 % (ref 12.0–46.0)
Lymphs Abs: 1.2 10*3/uL (ref 0.7–4.0)
MCHC: 33.8 g/dL (ref 30.0–36.0)
MCV: 82.6 fl (ref 78.0–100.0)
Monocytes Absolute: 0.4 10*3/uL (ref 0.1–1.0)
Monocytes Relative: 8.8 % (ref 3.0–12.0)
Neutro Abs: 2.4 10*3/uL (ref 1.4–7.7)
Neutrophils Relative %: 56.8 % (ref 43.0–77.0)
Platelets: 196 10*3/uL (ref 150.0–400.0)
RBC: 4.93 Mil/uL (ref 3.87–5.11)
RDW: 13.8 % (ref 11.5–15.5)
WBC: 4.2 10*3/uL (ref 4.0–10.5)

## 2021-03-15 LAB — BASIC METABOLIC PANEL
BUN: 15 mg/dL (ref 6–23)
CO2: 30 mEq/L (ref 19–32)
Calcium: 9.7 mg/dL (ref 8.4–10.5)
Chloride: 99 mEq/L (ref 96–112)
Creatinine, Ser: 0.9 mg/dL (ref 0.40–1.20)
GFR: 69.57 mL/min (ref >60.00)
Glucose, Bld: 125 mg/dL — ABNORMAL HIGH (ref 70–99)
Potassium: 4.9 mEq/L (ref 3.5–5.1)
Sodium: 137 mEq/L (ref 135–145)

## 2021-03-15 LAB — HEPATIC FUNCTION PANEL
ALT: 71 U/L — ABNORMAL HIGH (ref 0–35)
AST: 51 U/L — ABNORMAL HIGH (ref 0–37)
Albumin: 4.4 g/dL (ref 3.5–5.2)
Alkaline Phosphatase: 61 U/L (ref 39–117)
Bilirubin, Direct: 0.1 mg/dL (ref 0.0–0.3)
Total Bilirubin: 0.4 mg/dL (ref 0.2–1.2)
Total Protein: 7 g/dL (ref 6.0–8.3)

## 2021-03-15 LAB — HEMOGLOBIN A1C: Hgb A1c MFr Bld: 6.3 % (ref 4.6–6.5)

## 2021-03-19 ENCOUNTER — Ambulatory Visit (INDEPENDENT_AMBULATORY_CARE_PROVIDER_SITE_OTHER): Payer: BC Managed Care – PPO | Admitting: Internal Medicine

## 2021-03-19 ENCOUNTER — Other Ambulatory Visit: Payer: Self-pay

## 2021-03-19 VITALS — BP 114/70 | HR 94 | Temp 97.8°F | Resp 16 | Ht 62.0 in | Wt 225.0 lb

## 2021-03-19 DIAGNOSIS — E7800 Pure hypercholesterolemia, unspecified: Secondary | ICD-10-CM

## 2021-03-19 DIAGNOSIS — Z0000 Encounter for general adult medical examination without abnormal findings: Secondary | ICD-10-CM

## 2021-03-19 DIAGNOSIS — R945 Abnormal results of liver function studies: Secondary | ICD-10-CM

## 2021-03-19 DIAGNOSIS — F439 Reaction to severe stress, unspecified: Secondary | ICD-10-CM

## 2021-03-19 DIAGNOSIS — I10 Essential (primary) hypertension: Secondary | ICD-10-CM | POA: Diagnosis not present

## 2021-03-19 DIAGNOSIS — N281 Cyst of kidney, acquired: Secondary | ICD-10-CM

## 2021-03-19 DIAGNOSIS — E1165 Type 2 diabetes mellitus with hyperglycemia: Secondary | ICD-10-CM

## 2021-03-19 DIAGNOSIS — Z1231 Encounter for screening mammogram for malignant neoplasm of breast: Secondary | ICD-10-CM

## 2021-03-19 DIAGNOSIS — Z6841 Body Mass Index (BMI) 40.0 and over, adult: Secondary | ICD-10-CM

## 2021-03-19 NOTE — Assessment & Plan Note (Addendum)
Physical today 03/19/21.  Mammogram 11/28/19 - Briads I.  Ordered f/u mammogram.  She will schedule.  cologuard 04/12/20 - negative.

## 2021-03-19 NOTE — Progress Notes (Addendum)
Patient ID: LARIYA KINZIE, female   DOB: 09/07/1961, 60 y.o.   MRN: [id number]   Subjective:    Patient ID: IO DIEUJUSTE, female    DOB: 1961/08/03, 60 y.o.   MRN: [id number]  HPI This visit occurred during the SARS-CoV-2 public health emergency.  Safety protocols were in place, including screening questions prior to the visit, additional usage of staff PPE, and extensive cleaning of exam room while observing appropriate contact time as indicated for disinfecting solutions.   Patient here for a physical.  She reports she is doing relatively well.  Recently treated (seen at Urgent Care) for sinus infection.  No residual symptoms or problems.  Reviewed labs.  Liver function tests improved.  Still slightly elevated, but improved some.  No abdominal pain.  Sees GI - last evaluated 12/10/20.  Recommended f/u ultrasound in May.  Agreeable to schedule.  Discussed diet and exercise.  No chest pain or sob reported.  No abdominal pain.  Bowels moving.   .  Past Medical History:  Diagnosis Date   Abnormal liver function    Allergy    Anemia    GERD (gastroesophageal reflux disease)    Heart murmur    Hypercholesterolemia    Hyperglycemia    Hypertension    Past Surgical History:  Procedure Laterality Date   ABDOMINAL HYSTERECTOMY  2010   ACHILLES TENDON REPAIR  2009   BREAST BIOPSY Left 07/30/2006   benign   BREAST BIOPSY Left 02/18/2007 & 03/05/2007   benign   BREAST EXCISIONAL BIOPSY     BREAST SURGERY  2008   LUMBAR MICRODISCECTOMY  2004   ROTATOR CUFF REPAIR  2006   SEPTOPLASTY  1990   TONSILLECTOMY AND ADENOIDECTOMY  1969   Family History  Problem Relation Age of Onset   Hyperlipidemia Mother    Hypertension Mother    Stroke Mother    Heart disease Mother    Hypothyroidism Mother    Pernicious anemia Mother        aunt x 2   Hyperlipidemia Maternal Grandmother    Ulcers Maternal Grandmother    Lung cancer Father        Hx smoker   Hypertension Maternal Grandfather         MI   Heart disease Maternal Grandfather        myocardial infarction   Kidney cancer Paternal Grandmother    Ovarian cancer Maternal Aunt    Breast cancer Paternal Aunt    Diabetes Other    Hypothyroidism Other        aunt x 2   Colon cancer Neg Hx    Social History   Socioeconomic History   Marital status: Single    Spouse name: Not on file   Number of children: 0   Years of education: Not on file   Highest education level: Not on file  Occupational History   Not on file  Tobacco Use   Smoking status: Never   Smokeless tobacco: Never  Vaping Use   Vaping Use: Never used  Substance and Sexual Activity   Alcohol use: Yes    Alcohol/week: 0.0 standard drinks    Comment: rare   Drug use: No   Sexual activity: Not on file  Other Topics Concern   Not on file  Social History Narrative   She is single and has no children. She works at [employer] as an Web designer.    Social Determinants of Health   Financial  Resource Strain: Low Risk    Difficulty of Paying Living Expenses: Not hard at all  Food Insecurity: Not on file  Transportation Needs: Not on file  Physical Activity: Not on file  Stress: Not on file  Social Connections: Not on file    Review of Systems  Constitutional:  Negative for appetite change and unexpected weight change.  HENT:  Negative for congestion, sinus pressure and sore throat.   Eyes:  Negative for pain and visual disturbance.  Respiratory:  Negative for cough, chest tightness and shortness of breath.   Cardiovascular:  Negative for chest pain, palpitations and leg swelling.  Gastrointestinal:  Negative for abdominal pain, diarrhea, nausea and vomiting.  Genitourinary:  Negative for difficulty urinating and dysuria.  Musculoskeletal:  Negative for joint swelling and myalgias.  Skin:  Negative for color change and rash.  Neurological:  Negative for dizziness, light-headedness and headaches.  Hematological:  Negative for  adenopathy. Does not bruise/bleed easily.  Psychiatric/Behavioral:  Negative for agitation and dysphoric mood.       Objective:    Physical Exam Vitals reviewed.  Constitutional:      General: She is not in acute distress.    Appearance: Normal appearance. She is well-developed.  HENT:     Head: Normocephalic and atraumatic.     Right Ear: External ear normal.     Left Ear: External ear normal.  Eyes:     General: No scleral icterus.       Right eye: No discharge.        Left eye: No discharge.     Conjunctiva/sclera: Conjunctivae normal.  Neck:     Thyroid: No thyromegaly.  Cardiovascular:     Rate and Rhythm: Normal rate and regular rhythm.  Pulmonary:     Effort: No tachypnea, accessory muscle usage or respiratory distress.     Breath sounds: Normal breath sounds. No decreased breath sounds or wheezing.  Chest:  Breasts:    Right: No inverted nipple, mass, nipple discharge or tenderness (no axillary adenopathy).     Left: No inverted nipple, mass, nipple discharge or tenderness (no axilarry adenopathy).  Abdominal:     General: Bowel sounds are normal.     Palpations: Abdomen is soft.     Tenderness: There is no abdominal tenderness.  Musculoskeletal:        General: No swelling or tenderness.     Cervical back: Neck supple.  Lymphadenopathy:     Cervical: No cervical adenopathy.  Skin:    Findings: No erythema or rash.  Neurological:     Mental Status: She is alert and oriented to person, place, and time.  Psychiatric:        Mood and Affect: Mood normal.        Behavior: Behavior normal.    BP 114/70   Pulse 94   Temp 97.8 F (36.6 C)   Resp 16   Ht _0  (1.575 m)   Wt 225 lb (102.1 kg)   SpO2 98%   BMI 41.15 kg/m  Wt Readings from Last 3 Encounters:  03/19/21 225 lb (102.1 kg)  10/22/20 227 lb 3.2 oz (103.1 kg)  06/21/20 230 lb (104.3 kg)    Outpatient Encounter Medications as of 03/19/2021  Medication Sig   albuterol (PROVENTIL HFA;VENTOLIN  HFA) 108 (90 Base) MCG/ACT inhaler Inhale 2 puffs into the lungs every 6 (six) hours as needed for wheezing or shortness of breath.   atorvastatin (LIPITOR) 10 MG tablet Take 1 tablet (  10 mg total) by mouth daily.   Calcium Citrate-Vitamin D (CITRACAL + D PO) Take by mouth.   Cholecalciferol (VITAMIN D3) 1000 units CAPS Take 1 capsule by mouth daily.    Cyanocobalamin (B-12) 250 MCG TABS Take 250 mcg by mouth daily.   fluticasone (FLONASE) 50 MCG/ACT nasal spray Place 2 sprays into the nose as needed for rhinitis.   lisinopril (ZESTRIL) 20 MG tablet Take 1 tablet (20 mg total) by mouth daily.   loratadine (CLARITIN) 10 MG tablet Take 10 mg by mouth daily.   metFORMIN (GLUCOPHAGE-XR) 500 MG 24 hr tablet TAKE (2) TABLETS TWICE DAILY.   vitamin E 400 UNIT capsule Take 400 Units by mouth daily.   [DISCONTINUED] benzonatate (TESSALON) 100 MG capsule Take 2 capsules (200 mg total) by mouth every 8 (eight) hours.   [DISCONTINUED] promethazine-dextromethorphan (PROMETHAZINE-DM) 6.25-15 MG/5ML syrup Take 5 mLs by mouth 4 (four) times daily as needed.   No facility-administered encounter medications on file as of 03/19/2021.     Lab Results  Component Value Date   WBC 4.2 03/15/2021   HGB 13.8 03/15/2021   HCT 40.7 03/15/2021   PLT 196.0 03/15/2021   GLUCOSE 125 (H) 03/15/2021   CHOL 194 03/15/2021   TRIG 188.0 (H) 03/15/2021   HDL 45.70 03/15/2021   LDLDIRECT 167.0 02/15/2020   LDLCALC 111 (H) 03/15/2021   ALT 71 (H) 03/15/2021   AST 51 (H) 03/15/2021   NA 137 03/15/2021   K 4.9 03/15/2021   CL 99 03/15/2021   CREATININE 0.90 03/15/2021   BUN 15 03/15/2021   CO2 30 03/15/2021   TSH 3.33 10/18/2020   INR 1.0 07/13/2017   HGBA1C 6.3 03/15/2021   MICROALBUR 1.5 06/15/2020       Assessment & Plan:   Problem List Items Addressed This Visit     Abnormal liver function    S/p liver biopsy - NASH.  Being followed by GI.  Has had extensive w/up.  Just evaluated 11/2020.  Note reviewed.   Recommended f/u ultrasound in 01/2021.  Pt agreeable to schedule.  Recent liver panel check slightly improved.  Follow.        Relevant Orders   US Abdomen Complete   BMI 40.0-44.9, adult ([place])    Has lost some weight.  Discussed low carb diet and exercise.  Follow.        Health care maintenance    Physical today 03/19/21.  Mammogram 11/28/19 - [facility].  Ordered f/u mammogram.  She will schedule.  cologuard 04/12/20 - negative.        Hypercholesteremia    On lipitor.  Low cholesterol diet and exercise.  Follow lipid panel and liver function tests.         Relevant Orders   Hepatic function panel   Lipid panel   Hypertension    Continue lisinopril.  Blood pressure doing well.  Follow pressures.  Follow metabolic panel.        Renal cyst    Saw urology (Dr Erlene Quan) 06/15/20.  Note reviewed.  Recommended f/u renal ultrasound in 2 years.         Stress    Overall appears to be handling things relatively well.  Follow.         Type 2 diabetes mellitus with hyperglycemia (HCC)    Has lost weight.  Discussed diet and exercise.  On metformin.  a1c just checked:  6.3.  Continue low carb diet and exercise.  Follow met b and a1c.  Relevant Orders   Hemoglobin O2H   Basic metabolic panel   Microalbumin / creatinine urine ratio   Other Visit Diagnoses     Routine general medical examination at a health care facility    -  Primary   Visit for screening mammogram       Relevant Orders   MM 3D SCREEN BREAST BILATERAL        Einar Pheasant, MD

## 2021-03-22 ENCOUNTER — Encounter: Payer: Self-pay | Admitting: Internal Medicine

## 2021-03-22 NOTE — Addendum Note (Signed)
Addended by: Alisa Graff on: 03/22/2021 08:42 AM   Modules accepted: Orders

## 2021-03-22 NOTE — Assessment & Plan Note (Signed)
On lipitor.  Low cholesterol diet and exercise.  Follow lipid panel and liver function tests.   

## 2021-03-22 NOTE — Assessment & Plan Note (Signed)
Saw urology (Dr Erlene Quan) 06/15/20.  Note reviewed.  Recommended f/u renal ultrasound in 2 years.

## 2021-03-22 NOTE — Assessment & Plan Note (Signed)
Has lost weight.  Discussed diet and exercise.  On metformin.  a1c just checked:  6.3.  Continue low carb diet and exercise.  Follow met b and a1c.

## 2021-03-22 NOTE — Assessment & Plan Note (Signed)
Has lost some weight.  Discussed low carb diet and exercise.  Follow.

## 2021-03-22 NOTE — Assessment & Plan Note (Signed)
Overall appears to be handling things relatively well.  Follow.

## 2021-03-22 NOTE — Assessment & Plan Note (Signed)
Continue lisinopril.  Blood pressure doing well.  Follow pressures.  Follow metabolic panel.

## 2021-03-22 NOTE — Assessment & Plan Note (Signed)
S/p liver biopsy - NASH.  Being followed by GI.  Has had extensive w/up.  Just evaluated 11/2020.  Note reviewed.  Recommended f/u ultrasound in 01/2021.  Pt agreeable to schedule.  Recent liver panel check slightly improved.  Follow.

## 2021-04-22 ENCOUNTER — Other Ambulatory Visit: Payer: Self-pay

## 2021-04-22 ENCOUNTER — Ambulatory Visit
Admission: RE | Admit: 2021-04-22 | Discharge: 2021-04-22 | Disposition: A | Discharge status: Home / Self Care | Payer: BC Managed Care – PPO | Source: Ambulatory Visit | Attending: Internal Medicine | Admitting: Internal Medicine

## 2021-04-22 DIAGNOSIS — R945 Abnormal results of liver function studies: Secondary | ICD-10-CM | POA: Insufficient documentation

## 2021-04-23 ENCOUNTER — Telehealth: Payer: Self-pay

## 2021-04-23 ENCOUNTER — Other Ambulatory Visit: Payer: Self-pay | Admitting: Internal Medicine

## 2021-04-23 NOTE — Telephone Encounter (Signed)
LMTCB about abdominal ultrasound

## 2021-05-20 ENCOUNTER — Telehealth: Payer: Self-pay | Admitting: Internal Medicine

## 2021-05-20 NOTE — Telephone Encounter (Signed)
Mammogram has been ordered.  Needs to schedule.  Thanks.

## 2021-05-21 NOTE — Telephone Encounter (Signed)
Placed call to pt. Pt states she will call and schedule.

## 2021-06-13 ENCOUNTER — Other Ambulatory Visit: Payer: Self-pay | Admitting: Internal Medicine

## 2021-06-24 ENCOUNTER — Other Ambulatory Visit: Payer: Self-pay | Admitting: Internal Medicine

## 2021-06-27 ENCOUNTER — Other Ambulatory Visit: Payer: Self-pay

## 2021-06-27 ENCOUNTER — Ambulatory Visit
Admission: RE | Admit: 2021-06-27 | Discharge: 2021-06-27 | Disposition: A | Discharge status: Home / Self Care | Payer: BC Managed Care – PPO | Source: Ambulatory Visit | Attending: Internal Medicine | Admitting: Internal Medicine

## 2021-06-27 DIAGNOSIS — Z1231 Encounter for screening mammogram for malignant neoplasm of breast: Secondary | ICD-10-CM | POA: Insufficient documentation

## 2021-07-19 ENCOUNTER — Other Ambulatory Visit: Payer: Self-pay

## 2021-07-19 ENCOUNTER — Other Ambulatory Visit (INDEPENDENT_AMBULATORY_CARE_PROVIDER_SITE_OTHER): Payer: BC Managed Care – PPO

## 2021-07-19 DIAGNOSIS — E1165 Type 2 diabetes mellitus with hyperglycemia: Secondary | ICD-10-CM | POA: Diagnosis not present

## 2021-07-19 DIAGNOSIS — E7800 Pure hypercholesterolemia, unspecified: Secondary | ICD-10-CM | POA: Diagnosis not present

## 2021-07-19 LAB — BASIC METABOLIC PANEL
BUN: 17 mg/dL (ref 6–23)
CO2: 29 mEq/L (ref 19–32)
Calcium: 9.5 mg/dL (ref 8.4–10.5)
Chloride: 98 mEq/L (ref 96–112)
Creatinine, Ser: 0.92 mg/dL (ref 0.40–1.20)
GFR: 67.59 mL/min (ref >60.00)
Glucose, Bld: 106 mg/dL — ABNORMAL HIGH (ref 70–99)
Potassium: 4.7 mEq/L (ref 3.5–5.1)
Sodium: 135 mEq/L (ref 135–145)

## 2021-07-19 LAB — HEPATIC FUNCTION PANEL
ALT: 52 U/L — ABNORMAL HIGH (ref 0–35)
AST: 40 U/L — ABNORMAL HIGH (ref 0–37)
Albumin: 4.5 g/dL (ref 3.5–5.2)
Alkaline Phosphatase: 52 U/L (ref 39–117)
Bilirubin, Direct: 0.1 mg/dL (ref 0.0–0.3)
Total Bilirubin: 0.4 mg/dL (ref 0.2–1.2)
Total Protein: 6.7 g/dL (ref 6.0–8.3)

## 2021-07-19 LAB — LIPID PANEL
Cholesterol: 175 mg/dL (ref 0–200)
HDL: 47 mg/dL (ref >39.00)
LDL Cholesterol: 95 mg/dL (ref 0–99)
NonHDL: 128.07
Total CHOL/HDL Ratio: 4
Triglycerides: 163 mg/dL — ABNORMAL HIGH (ref 0.0–149.0)
VLDL: 32.6 mg/dL (ref 0.0–40.0)

## 2021-07-19 LAB — MICROALBUMIN / CREATININE URINE RATIO
Creatinine,U: 38.4 mg/dL
Microalb Creat Ratio: 1.8 mg/g (ref 0.0–30.0)
Microalb, Ur: <0.7 mg/dL (ref 0.0–1.9)

## 2021-07-19 LAB — HEMOGLOBIN A1C: Hgb A1c MFr Bld: 6 % (ref 4.6–6.5)

## 2021-07-22 ENCOUNTER — Other Ambulatory Visit: Payer: Self-pay

## 2021-07-22 ENCOUNTER — Ambulatory Visit: Payer: BC Managed Care – PPO | Admitting: Internal Medicine

## 2021-07-22 VITALS — BP 112/70 | HR 74 | Temp 97.9°F | Resp 16 | Ht 62.0 in | Wt 211.0 lb

## 2021-07-22 DIAGNOSIS — E7800 Pure hypercholesterolemia, unspecified: Secondary | ICD-10-CM

## 2021-07-22 DIAGNOSIS — F439 Reaction to severe stress, unspecified: Secondary | ICD-10-CM

## 2021-07-22 DIAGNOSIS — N281 Cyst of kidney, acquired: Secondary | ICD-10-CM | POA: Diagnosis not present

## 2021-07-22 DIAGNOSIS — I10 Essential (primary) hypertension: Secondary | ICD-10-CM | POA: Diagnosis not present

## 2021-07-22 DIAGNOSIS — E1165 Type 2 diabetes mellitus with hyperglycemia: Secondary | ICD-10-CM | POA: Diagnosis not present

## 2021-07-22 DIAGNOSIS — Z23 Encounter for immunization: Secondary | ICD-10-CM | POA: Diagnosis not present

## 2021-07-22 DIAGNOSIS — R945 Abnormal results of liver function studies: Secondary | ICD-10-CM

## 2021-07-22 DIAGNOSIS — Z0000 Encounter for general adult medical examination without abnormal findings: Secondary | ICD-10-CM

## 2021-07-22 MED ORDER — METFORMIN HCL ER 500 MG PO TB24: 500.0000 mg | ORAL_TABLET | Freq: Two times a day (BID) | ORAL | 3 refills | Status: DC

## 2021-07-22 NOTE — Progress Notes (Signed)
[name], female   DOB: 09/26/1960, 60 y.o.   MRN: [id number]   Subjective:    [name], female    DOB: 05-26-1961, 60 y.o.   MRN: [id number]  This visit occurred during the SARS-CoV-2 public health emergency.  Safety protocols were in place, including screening questions prior to the visit, additional usage of staff PPE, and extensive cleaning of exam room while observing appropriate contact time as indicated for disinfecting solutions.   Patient here for a scheduled follow up.   Chief Complaint  Patient presents with   Hyperlipidemia   Diabetes   Hypertension   .   HPI She has adjusted her diet.  Not eating late.  Has lost weight.  Discussed labs.  Liver function tests improved.  Triglycerides and a1c improved.  Discussed continued diet and exercise.  No chest pain or sob reported.  No abdominal pain or bowel change reported.  Work is some better.  Blood pressures averaging 120s/70s.  Handling stress.    Past Medical History:  Diagnosis Date   Abnormal liver function    Allergy    Anemia    GERD (gastroesophageal reflux disease)    Heart murmur    Hypercholesterolemia    Hyperglycemia    Hypertension    Past Surgical History:  Procedure Laterality Date   ABDOMINAL HYSTERECTOMY  2010   ACHILLES TENDON REPAIR  2009   BREAST BIOPSY Left 07/30/2006   benign   BREAST BIOPSY Left 02/18/2007 & 03/05/2007   benign   BREAST EXCISIONAL BIOPSY     BREAST SURGERY  2008   LUMBAR MICRODISCECTOMY  2004   ROTATOR CUFF REPAIR  2006   SEPTOPLASTY  1990   TONSILLECTOMY AND ADENOIDECTOMY  1969   Family History  Problem Relation Age of Onset   Hyperlipidemia Mother    Hypertension Mother    Stroke Mother    Heart disease Mother    Hypothyroidism Mother    Pernicious anemia Mother        aunt x 2   Hyperlipidemia Maternal Grandmother    Ulcers Maternal Grandmother    Lung cancer Father        Hx smoker   Hypertension Maternal Grandfather         MI   Heart disease Maternal Grandfather        myocardial infarction   Kidney cancer Paternal Grandmother    Ovarian cancer Maternal Aunt    Breast cancer Paternal Aunt    Diabetes Other    Hypothyroidism Other        aunt x 2   Colon cancer Neg Hx    Social History   Socioeconomic History   Marital status: Single    Spouse name: Not on file   Number of children: 0   Years of education: Not on file   Highest education level: Not on file  Occupational History   Not on file  Tobacco Use   Smoking status: Never   Smokeless tobacco: Never  Vaping Use   Vaping Use: Never used  Substance and Sexual Activity   Alcohol use: Yes    Alcohol/week: 0.0 standard drinks    Comment: rare   Drug use: No   Sexual activity: Not on file  Other Topics Concern   Not on file  Social History Narrative   She is single and has no children. She works at [employer] as an Web designer.    Social Determinants of  Health   Financial Resource Strain: Not on file  Food Insecurity: Not on file  Transportation Needs: Not on file  Physical Activity: Not on file  Stress: Not on file  Social Connections: Not on file     Review of Systems  Constitutional:  Negative for appetite change and unexpected weight change.  HENT:  Negative for congestion and sinus pressure.   Respiratory:  Negative for cough, chest tightness and shortness of breath.   Cardiovascular:  Negative for chest pain, palpitations and leg swelling.  Gastrointestinal:  Negative for abdominal pain, diarrhea, nausea and vomiting.  Genitourinary:  Negative for difficulty urinating and dysuria.  Musculoskeletal:  Negative for joint swelling and myalgias.  Skin:  Negative for color change and rash.  Neurological:  Negative for dizziness, light-headedness and headaches.  Psychiatric/Behavioral:  Negative for agitation and dysphoric mood.       Objective:     BP 112/70   Pulse 74   Temp 97.9 F (36.6 C)    Resp 16   Ht _0  (1.575 m)   Wt 211 lb (95.7 kg)   SpO2 98%   BMI 38.59 kg/m  Wt Readings from Last 3 Encounters:  07/22/21 211 lb (95.7 kg)  03/19/21 225 lb (102.1 kg)  10/22/20 227 lb 3.2 oz (103.1 kg)    Physical Exam Vitals reviewed.  Constitutional:      General: She is not in acute distress.    Appearance: Normal appearance.  HENT:     Head: Normocephalic and atraumatic.     Right Ear: External ear normal.     Left Ear: External ear normal.  Eyes:     General: No scleral icterus.       Right eye: No discharge.        Left eye: No discharge.     Conjunctiva/sclera: Conjunctivae normal.  Neck:     Thyroid: No thyromegaly.  Cardiovascular:     Rate and Rhythm: Normal rate and regular rhythm.  Pulmonary:     Effort: No respiratory distress.     Breath sounds: Normal breath sounds. No wheezing.  Abdominal:     General: Bowel sounds are normal.     Palpations: Abdomen is soft.     Tenderness: There is no abdominal tenderness.  Musculoskeletal:        General: No swelling or tenderness.     Cervical back: Neck supple. No tenderness.  Lymphadenopathy:     Cervical: No cervical adenopathy.  Skin:    Findings: No erythema or rash.  Neurological:     Mental Status: She is alert.  Psychiatric:        Mood and Affect: Mood normal.        Behavior: Behavior normal.     Outpatient Encounter Medications as of 07/22/2021  Medication Sig   albuterol (PROVENTIL HFA;VENTOLIN HFA) 108 (90 Base) MCG/ACT inhaler Inhale 2 puffs into the lungs every 6 (six) hours as needed for wheezing or shortness of breath.   atorvastatin (LIPITOR) 10 MG tablet Take 1 tablet (10 mg total) by mouth daily.   Calcium Citrate-Vitamin D (CITRACAL + D PO) Take by mouth.   Cholecalciferol (VITAMIN D3) 1000 units CAPS Take 1 capsule by mouth daily.    Cyanocobalamin (B-12) 250 MCG TABS Take 250 mcg by mouth daily.   fluticasone (FLONASE) 50 MCG/ACT nasal spray Place 2 sprays into the nose as needed  for rhinitis.   lisinopril (ZESTRIL) 20 MG tablet Take 1 tablet (20 mg total)  by mouth daily.   loratadine (CLARITIN) 10 MG tablet Take 10 mg by mouth daily.   metFORMIN (GLUCOPHAGE-XR) 500 MG 24 hr tablet Take 1 tablet (500 mg total) by mouth 2 (two) times daily.   vitamin E 400 UNIT capsule Take 400 Units by mouth daily.   [DISCONTINUED] metFORMIN (GLUCOPHAGE-XR) 500 MG 24 hr tablet TAKE (2) TABLETS TWICE DAILY.   No facility-administered encounter medications on file as of 07/22/2021.     Lab Results  Component Value Date   WBC 4.2 03/15/2021   HGB 13.8 03/15/2021   HCT 40.7 03/15/2021   PLT 196.0 03/15/2021   GLUCOSE 106 (H) 07/19/2021   CHOL 175 07/19/2021   TRIG 163.0 (H) 07/19/2021   HDL 47.00 07/19/2021   LDLDIRECT 167.0 02/15/2020   LDLCALC 95 07/19/2021   ALT 52 (H) 07/19/2021   AST 40 (H) 07/19/2021   NA 135 07/19/2021   K 4.7 07/19/2021   CL 98 07/19/2021   CREATININE 0.92 07/19/2021   BUN 17 07/19/2021   CO2 29 07/19/2021   TSH 3.33 10/18/2020   INR 1.0 07/13/2017   HGBA1C 6.0 07/19/2021   MICROALBUR <0.7 07/19/2021    MM 3D SCREEN BREAST BILATERAL  Result Date: 07/03/2021 CLINICAL DATA:  Screening. EXAM: DIGITAL SCREENING BILATERAL MAMMOGRAM WITH TOMOSYNTHESIS AND CAD TECHNIQUE: Bilateral screening digital craniocaudal and mediolateral oblique mammograms were obtained. Bilateral screening digital breast tomosynthesis was performed. The images were evaluated with computer-aided detection. COMPARISON:  Previous exam(s). ACR Breast Density Category b: There are scattered areas of fibroglandular density. FINDINGS: There are no findings suspicious for malignancy. IMPRESSION: No mammographic evidence of malignancy. A result letter of this screening mammogram will be mailed directly to the patient. RECOMMENDATION: Screening mammogram in one year. (Code:SM-B-01Y) BI-RADS CATEGORY  1: Negative. Electronically Signed   By: Abelardo Diesel M.D.   On: 07/03/2021 10:37       Assessment & Plan:   Problem List Items Addressed This Visit     Abnormal liver function    S/p liver biopsy - NASH.  Being followed by GI.   Has lost weight.  Continue diet and weight loss.  Follow liver function tests.  Recheck check improved.  Continue diet and weight loss.        Health care maintenance   Hypercholesteremia    On lipitor.  Low cholesterol diet and exercise.  Follow lipid panel and liver function tests.        Relevant Orders   Hepatic function panel   Lipid panel   Hypertension    Continue lisinopril.  Blood pressure doing well.  Follow pressures.  Follow metabolic panel.       Relevant Orders   Basic metabolic panel   TSH   Renal cyst    Saw urology (Dr Erlene Quan) 06/15/20.  Note reviewed.  Recommended f/u renal ultrasound in 2 years.        Stress    Overall appears to be handling things relatively well.  Follow.        Type 2 diabetes mellitus with hyperglycemia (HCC) - Primary    Has lost weight.  Discussed diet and exercise.  On metformin.  a1c just checked - improved - 6.0.   Continue low carb diet and exercise.  Follow met b and a1c.  Can decrease metformin to one bid.       Relevant Medications   metFORMIN (GLUCOPHAGE-XR) 500 MG 24 hr tablet   Other Relevant Orders   Hemoglobin A1c  Other Visit Diagnoses     Need for immunization against influenza       Relevant Orders   Flu Vaccine QUAD 28moIM (Fluarix, Fluzone & Alfiuria Quad PF) (Completed)        CEinar Pheasant MD

## 2021-07-28 ENCOUNTER — Encounter: Payer: Self-pay | Admitting: Internal Medicine

## 2021-07-28 NOTE — Assessment & Plan Note (Signed)
S/p liver biopsy - NASH.  Being followed by GI.   Has lost weight.  Continue diet and weight loss.  Follow liver function tests.  Recheck check improved.  Continue diet and weight loss.

## 2021-07-28 NOTE — Assessment & Plan Note (Signed)
On lipitor.  Low cholesterol diet and exercise.  Follow lipid panel and liver function tests.   

## 2021-07-28 NOTE — Assessment & Plan Note (Signed)
Saw urology (Dr Erlene Quan) 06/15/20.  Note reviewed.  Recommended f/u renal ultrasound in 2 years.

## 2021-07-28 NOTE — Assessment & Plan Note (Signed)
Continue lisinopril.  Blood pressure doing well.  Follow pressures.  Follow metabolic panel.

## 2021-07-28 NOTE — Assessment & Plan Note (Addendum)
Has lost weight.  Discussed diet and exercise.  On metformin.  a1c just checked - improved - 6.0.   Continue low carb diet and exercise.  Follow met b and a1c.  Can decrease metformin to one bid.

## 2021-07-28 NOTE — Assessment & Plan Note (Signed)
Overall appears to be handling things relatively well.  Follow.

## 2021-08-03 ENCOUNTER — Other Ambulatory Visit: Payer: Self-pay | Admitting: Internal Medicine

## 2021-08-06 ENCOUNTER — Encounter: Payer: Self-pay | Admitting: Internal Medicine

## 2021-08-06 ENCOUNTER — Telehealth (INDEPENDENT_AMBULATORY_CARE_PROVIDER_SITE_OTHER): Payer: BC Managed Care – PPO | Admitting: Internal Medicine

## 2021-08-06 DIAGNOSIS — U071 COVID-19: Secondary | ICD-10-CM | POA: Diagnosis not present

## 2021-08-06 DIAGNOSIS — E1165 Type 2 diabetes mellitus with hyperglycemia: Secondary | ICD-10-CM

## 2021-08-06 DIAGNOSIS — I10 Essential (primary) hypertension: Secondary | ICD-10-CM | POA: Diagnosis not present

## 2021-08-06 MED ORDER — MOLNUPIRAVIR EUA 200MG CAPSULE: 4.0000 | ORAL_CAPSULE | Freq: Two times a day (BID) | ORAL | 0 refills | Status: AC

## 2021-08-06 NOTE — Telephone Encounter (Signed)
Patient scheduled with Dr Scott.  

## 2021-08-06 NOTE — Telephone Encounter (Signed)
Pt called in stating that she did a at home test. Pt stated that the test came back positive. Pt is experiencing cough, fever of 100.7, and congestion. Pt was wondering if she can be prescribe something today. Dr. Nicki Reaper has no avail appt.

## 2021-08-06 NOTE — Progress Notes (Signed)
[name], female   DOB: 04/01/1961, 60 y.o.   MRN: [id number]   Virtual Visit via video Note  This visit type was conducted due to national recommendations for restrictions regarding the COVID-19 pandemic (e.g. social distancing).  This format is felt to be most appropriate for this patient at this time.  All issues noted in this document were discussed and addressed.  No physical exam was performed (except for noted visual exam findings with Video Visits).   I connected with Angie Riviere by a video enabled telemedicine application and verified that I am speaking with the correct person using two identifiers. Location patient: home Location provider: work Persons participating in the virtual visit: patient, provider  The limitations, risks, security and privacy concerns of performing an evaluation and management service by video and the availability of in person appointments have been discussed.  It has also been discussed with the patient that there may be a patient responsible charge related to this service. The patient expressed understanding and agreed to proceed.   Reason for visit: work in appt  HPI: Work in with covid.  Her uncle was sick.  Tested positive for covid.  She started having symptoms Sat/Sunday.  Developed scratchy throat and headache.  Initial covid test - negative.  Developed sneezing. Cough.  Increased nasal congestion.  No sore throat now.  No significant headache now. Tmax 100.8. Clear mucus production.  Tested positive for covid this am.  Started mucinex yesterday.  Reports dry cough.  No chest congestion or sob.  No chest pain.  No nausea, vomiting or diarrhea.  No increased cough.  No significant drainage now.     ROS: See pertinent positives and negatives per HPI.  Past Medical History:  Diagnosis Date   Abnormal liver function    Allergy    Anemia    GERD (gastroesophageal reflux disease)    Heart murmur    Hypercholesterolemia     Hyperglycemia    Hypertension     Past Surgical History:  Procedure Laterality Date   ABDOMINAL HYSTERECTOMY  2010   ACHILLES TENDON REPAIR  2009   BREAST BIOPSY Left 07/30/2006   benign   BREAST BIOPSY Left 02/18/2007 & 03/05/2007   benign   BREAST EXCISIONAL BIOPSY     BREAST SURGERY  2008   LUMBAR MICRODISCECTOMY  2004   ROTATOR CUFF REPAIR  2006   SEPTOPLASTY  1990   TONSILLECTOMY AND ADENOIDECTOMY  1969    Family History  Problem Relation Age of Onset   Hyperlipidemia Mother    Hypertension Mother    Stroke Mother    Heart disease Mother    Hypothyroidism Mother    Pernicious anemia Mother        aunt x 2   Hyperlipidemia Maternal Grandmother    Ulcers Maternal Grandmother    Lung cancer Father        Hx smoker   Hypertension Maternal Grandfather        MI   Heart disease Maternal Grandfather        myocardial infarction   Kidney cancer Paternal Grandmother    Ovarian cancer Maternal Aunt    Breast cancer Paternal Aunt    Diabetes Other    Hypothyroidism Other        aunt x 2   Colon cancer Neg Hx     SOCIAL HX: reviewed.    Current Outpatient Medications:    molnupiravir EUA (LAGEVRIO) 200 mg CAPS capsule, Take 4  capsules (800 mg total) by mouth 2 (two) times daily for 5 days., Disp: 40 capsule, Rfl: 0   albuterol (PROVENTIL HFA;VENTOLIN HFA) 108 (90 Base) MCG/ACT inhaler, Inhale 2 puffs into the lungs every 6 (six) hours as needed for wheezing or shortness of breath., Disp: 1 Inhaler, Rfl: 2   atorvastatin (LIPITOR) 10 MG tablet, Take 1 tablet (10 mg total) by mouth daily., Disp: 90 tablet, Rfl: 0   Calcium Citrate-Vitamin D (CITRACAL + D PO), Take by mouth., Disp: , Rfl:    Cholecalciferol (VITAMIN D3) 1000 units CAPS, Take 1 capsule by mouth daily. , Disp: , Rfl:    Cyanocobalamin (B-12) 250 MCG TABS, Take 250 mcg by mouth daily., Disp: , Rfl:    fluticasone (FLONASE) 50 MCG/ACT nasal spray, Place 2 sprays into the nose as needed for rhinitis., Disp: ,  Rfl:    lisinopril (ZESTRIL) 20 MG tablet, Take 1 tablet (20 mg total) by mouth daily., Disp: 90 tablet, Rfl: 0   loratadine (CLARITIN) 10 MG tablet, Take 10 mg by mouth daily., Disp: , Rfl:    metFORMIN (GLUCOPHAGE-XR) 500 MG 24 hr tablet, Take 1 tablet (500 mg total) by mouth 2 (two) times daily., Disp: 60 tablet, Rfl: 3   vitamin E 400 UNIT capsule, Take 400 Units by mouth daily., Disp: , Rfl:   EXAM:  GENERAL: alert, oriented, appears well and in no acute distress  HEENT: atraumatic, conjunttiva clear, no obvious abnormalities on inspection of external nose and ears  NECK: normal movements of the head and neck  LUNGS: on inspection no signs of respiratory distress, breathing rate appears normal, no obvious gross SOB, gasping or wheezing  CV: no obvious cyanosis  PSYCH/NEURO: pleasant and cooperative, no obvious depression or anxiety, speech and thought processing grossly intact  ASSESSMENT AND PLAN:  Discussed the following assessment and plan:  Problem List Items Addressed This Visit     COVID-19 virus infection    Symptoms as outlined.  Tested positive today.  mucinex as directed.  Saline nasal spray and steroid nasal spray. discussed oral antiviral medication.  Discussed EUA.  Discussed possible side effects of medication.   molnupiravir as directed.  Follow.  Call with update.  Discussed quarantine guidelines.        Relevant Medications   molnupiravir EUA (LAGEVRIO) 200 mg CAPS capsule   Hypertension    Continue lisinopril.  Follow pressures.  Follow metabolic panel.       Type 2 diabetes mellitus with hyperglycemia (HCC)     On metformin.  a1c just checked - improved - 6.0.   Continue low carb diet and exercise.  Follow met b and a1c.        Return if symptoms worsen or fail to improve.   I discussed the assessment and treatment plan with the patient. The patient was provided an opportunity to ask questions and all were answered. The patient agreed with the plan  and demonstrated an understanding of the instructions.   The patient was advised to call back or seek an in-person evaluation if the symptoms worsen or if the condition fails to improve as anticipated.    Einar Pheasant, MD

## 2021-08-11 ENCOUNTER — Encounter: Payer: Self-pay | Admitting: Internal Medicine

## 2021-08-11 DIAGNOSIS — U071 COVID-19: Secondary | ICD-10-CM | POA: Insufficient documentation

## 2021-08-11 DIAGNOSIS — Z8616 Personal history of COVID-19: Secondary | ICD-10-CM | POA: Insufficient documentation

## 2021-08-11 NOTE — Assessment & Plan Note (Signed)
Continue lisinopril.  Follow pressures.  Follow metabolic panel.

## 2021-08-11 NOTE — Assessment & Plan Note (Signed)
Symptoms as outlined.  Tested positive today.  mucinex as directed.  Saline nasal spray and steroid nasal spray. discussed oral antiviral medication.  Discussed EUA.  Discussed possible side effects of medication.   molnupiravir as directed.  Follow.  Call with update.  Discussed quarantine guidelines.

## 2021-08-11 NOTE — Assessment & Plan Note (Signed)
On metformin.  a1c just checked - improved - 6.0.   Continue low carb diet and exercise.  Follow met b and a1c.

## 2021-11-09 ENCOUNTER — Other Ambulatory Visit: Payer: Self-pay | Admitting: Internal Medicine

## 2021-11-19 ENCOUNTER — Other Ambulatory Visit: Payer: Self-pay

## 2021-11-19 ENCOUNTER — Other Ambulatory Visit (INDEPENDENT_AMBULATORY_CARE_PROVIDER_SITE_OTHER): Payer: BC Managed Care – PPO

## 2021-11-19 ENCOUNTER — Telehealth: Payer: Self-pay | Admitting: Internal Medicine

## 2021-11-19 ENCOUNTER — Other Ambulatory Visit
Admission: RE | Admit: 2021-11-19 | Discharge: 2021-11-19 | Disposition: A | Discharge status: Home / Self Care | Payer: BC Managed Care – PPO | Source: Ambulatory Visit | Attending: Internal Medicine | Admitting: Internal Medicine

## 2021-11-19 DIAGNOSIS — E7800 Pure hypercholesterolemia, unspecified: Secondary | ICD-10-CM | POA: Diagnosis not present

## 2021-11-19 DIAGNOSIS — E1165 Type 2 diabetes mellitus with hyperglycemia: Secondary | ICD-10-CM | POA: Diagnosis not present

## 2021-11-19 DIAGNOSIS — I10 Essential (primary) hypertension: Secondary | ICD-10-CM | POA: Diagnosis not present

## 2021-11-19 DIAGNOSIS — E875 Hyperkalemia: Secondary | ICD-10-CM

## 2021-11-19 LAB — LIPID PANEL
Cholesterol: 161 mg/dL (ref 0–200)
HDL: 48.9 mg/dL (ref >39.00)
LDL Cholesterol: 79 mg/dL (ref 0–99)
NonHDL: 111.94
Total CHOL/HDL Ratio: 3
Triglycerides: 165 mg/dL — ABNORMAL HIGH (ref 0.0–149.0)
VLDL: 33 mg/dL (ref 0.0–40.0)

## 2021-11-19 LAB — BASIC METABOLIC PANEL
BUN: 17 mg/dL (ref 6–23)
CO2: 28 mEq/L (ref 19–32)
Calcium: 9.4 mg/dL (ref 8.4–10.5)
Chloride: 98 mEq/L (ref 96–112)
Creatinine, Ser: 0.86 mg/dL (ref 0.40–1.20)
GFR: 73.12 mL/min (ref >60.00)
Glucose, Bld: 97 mg/dL (ref 70–99)
Potassium: 5.3 mEq/L — ABNORMAL HIGH (ref 3.5–5.1)
Sodium: 134 mEq/L — ABNORMAL LOW (ref 135–145)

## 2021-11-19 LAB — HEPATIC FUNCTION PANEL
ALT: 30 U/L (ref 0–35)
AST: 25 U/L (ref 0–37)
Albumin: 4.5 g/dL (ref 3.5–5.2)
Alkaline Phosphatase: 58 U/L (ref 39–117)
Bilirubin, Direct: 0.1 mg/dL (ref 0.0–0.3)
Total Bilirubin: 0.4 mg/dL (ref 0.2–1.2)
Total Protein: 6.8 g/dL (ref 6.0–8.3)

## 2021-11-19 LAB — HEMOGLOBIN A1C: Hgb A1c MFr Bld: 5.9 % (ref 4.6–6.5)

## 2021-11-19 LAB — POTASSIUM: Potassium: 4.4 mmol/L (ref 3.5–5.1)

## 2021-11-19 LAB — TSH: TSH: 1.88 u[IU]/mL (ref 0.35–5.50)

## 2021-11-19 NOTE — Telephone Encounter (Signed)
Pt calling to make sure orders put in ?

## 2021-11-19 NOTE — Telephone Encounter (Signed)
Pt called in to speak with you in regards to her labs. Pt requesting callback  ?

## 2021-11-19 NOTE — Addendum Note (Signed)
Addended by: Lars Masson on: 11/19/2021 03:12 PM ? ? Modules accepted: Orders ? ?

## 2021-11-21 ENCOUNTER — Other Ambulatory Visit: Payer: Self-pay

## 2021-11-21 ENCOUNTER — Ambulatory Visit (INDEPENDENT_AMBULATORY_CARE_PROVIDER_SITE_OTHER): Payer: BC Managed Care – PPO | Admitting: Internal Medicine

## 2021-11-21 VITALS — BP 118/70 | HR 76 | Temp 97.9°F | Resp 16 | Ht 62.0 in | Wt 200.0 lb

## 2021-11-21 DIAGNOSIS — N281 Cyst of kidney, acquired: Secondary | ICD-10-CM

## 2021-11-21 DIAGNOSIS — D649 Anemia, unspecified: Secondary | ICD-10-CM

## 2021-11-21 DIAGNOSIS — I10 Essential (primary) hypertension: Secondary | ICD-10-CM | POA: Diagnosis not present

## 2021-11-21 DIAGNOSIS — Z114 Encounter for screening for human immunodeficiency virus [HIV]: Secondary | ICD-10-CM

## 2021-11-21 DIAGNOSIS — Z8616 Personal history of COVID-19: Secondary | ICD-10-CM | POA: Diagnosis not present

## 2021-11-21 DIAGNOSIS — R945 Abnormal results of liver function studies: Secondary | ICD-10-CM

## 2021-11-21 DIAGNOSIS — Z1159 Encounter for screening for other viral diseases: Secondary | ICD-10-CM

## 2021-11-21 DIAGNOSIS — F439 Reaction to severe stress, unspecified: Secondary | ICD-10-CM

## 2021-11-21 DIAGNOSIS — E1165 Type 2 diabetes mellitus with hyperglycemia: Secondary | ICD-10-CM

## 2021-11-21 DIAGNOSIS — U071 COVID-19: Secondary | ICD-10-CM

## 2021-11-21 DIAGNOSIS — E7800 Pure hypercholesterolemia, unspecified: Secondary | ICD-10-CM | POA: Diagnosis not present

## 2021-11-21 LAB — HM DIABETES FOOT EXAM

## 2021-11-21 NOTE — Progress Notes (Unsigned)
[name], female   DOB: 05/18/1961, 61 y.o.   MRN: [id number]   Subjective:    [name], female    DOB: 08-May-1961, 61 y.o.   MRN: [id number]  This visit occurred during the SARS-CoV-2 public health emergency.  Safety protocols were in place, including screening questions prior to the visit, additional usage of staff PPE, and extensive cleaning of exam room while observing appropriate contact time as indicated for disinfecting solutions.   Patient here for a scheduled follow up.   Chief Complaint  Patient presents with   Diabetes   Hyperlipidemia   Hypertension   .   HPI Follow up regarding her blood pressure, blood sugar, cholesterol and liver function.  She is doing well.  Has lost more weight.  Feels good. Is more active.  Time is changing and she plans to start walking more.  No chest pain or sob reported.  No abdominal pain or bowel change reported.  Some occasional leg cramps (left leg), but only occurs once or twice a month.  Since losing weight, back is not as stiff.  Joints not as stiff.    Past Medical History:  Diagnosis Date   Abnormal liver function    Allergy    Anemia    GERD (gastroesophageal reflux disease)    Heart murmur    Hypercholesterolemia    Hyperglycemia    Hypertension    Past Surgical History:  Procedure Laterality Date   ABDOMINAL HYSTERECTOMY  2010   ACHILLES TENDON REPAIR  2009   BREAST BIOPSY Left 07/30/2006   benign   BREAST BIOPSY Left 02/18/2007 & 03/05/2007   benign   BREAST EXCISIONAL BIOPSY     BREAST SURGERY  2008   LUMBAR MICRODISCECTOMY  2004   ROTATOR CUFF REPAIR  2006   SEPTOPLASTY  1990   TONSILLECTOMY AND ADENOIDECTOMY  1969   Family History  Problem Relation Age of Onset   Hyperlipidemia Mother    Hypertension Mother    Stroke Mother    Heart disease Mother    Hypothyroidism Mother    Pernicious anemia Mother        aunt x 2   Hyperlipidemia Maternal Grandmother    Ulcers Maternal  Grandmother    Lung cancer Father        Hx smoker   Hypertension Maternal Grandfather        MI   Heart disease Maternal Grandfather        myocardial infarction   Kidney cancer Paternal Grandmother    Ovarian cancer Maternal Aunt    Breast cancer Paternal Aunt    Diabetes Other    Hypothyroidism Other        aunt x 2   Colon cancer Neg Hx    Social History   Socioeconomic History   Marital status: Single    Spouse name: Not on file   Number of children: 0   Years of education: Not on file   Highest education level: Not on file  Occupational History   Not on file  Tobacco Use   Smoking status: Never   Smokeless tobacco: Never  Vaping Use   Vaping Use: Never used  Substance and Sexual Activity   Alcohol use: Yes    Alcohol/week: 0.0 standard drinks    Comment: rare   Drug use: No   Sexual activity: Not on file  Other Topics Concern   Not on file  Social History Narrative  She is single and has no children. She works at [employer] as an Web designer.    Social Determinants of Health   Financial Resource Strain: Not on file  Food Insecurity: Not on file  Transportation Needs: Not on file  Physical Activity: Not on file  Stress: Not on file  Social Connections: Not on file     Review of Systems  Constitutional:  Negative for appetite change and unexpected weight change.  HENT:  Negative for congestion and sinus pressure.   Respiratory:  Negative for cough, chest tightness and shortness of breath.   Cardiovascular:  Negative for chest pain, palpitations and leg swelling.  Gastrointestinal:  Negative for abdominal pain, diarrhea, nausea and vomiting.  Genitourinary:  Negative for difficulty urinating and dysuria.  Musculoskeletal:  Negative for joint swelling and myalgias.       Occasional leg cramps as outlined.   Skin:  Negative for color change and rash.  Neurological:  Negative for dizziness, light-headedness and headaches.   Psychiatric/Behavioral:  Negative for agitation and dysphoric mood.       Objective:     BP 118/70    Pulse 76    Temp 97.9 F (36.6 C)    Resp 16    Ht 5' 2"  (1.575 m)    Wt 200 lb (90.7 kg)    SpO2 98%    BMI 36.58 kg/m  Wt Readings from Last 3 Encounters:  11/21/21 200 lb (90.7 kg)  07/22/21 211 lb (95.7 kg)  03/19/21 225 lb (102.1 kg)    Physical Exam Vitals reviewed.  Constitutional:      General: She is not in acute distress.    Appearance: Normal appearance.  HENT:     Head: Normocephalic and atraumatic.     Right Ear: External ear normal.     Left Ear: External ear normal.  Eyes:     General: No scleral icterus.       Right eye: No discharge.        Left eye: No discharge.     Conjunctiva/sclera: Conjunctivae normal.  Neck:     Thyroid: No thyromegaly.  Cardiovascular:     Rate and Rhythm: Normal rate and regular rhythm.  Pulmonary:     Effort: No respiratory distress.     Breath sounds: Normal breath sounds. No wheezing.  Abdominal:     General: Bowel sounds are normal.     Palpations: Abdomen is soft.     Tenderness: There is no abdominal tenderness.  Musculoskeletal:        General: No swelling or tenderness.     Cervical back: Neck supple. No tenderness.  Lymphadenopathy:     Cervical: No cervical adenopathy.  Skin:    Findings: No erythema or rash.  Neurological:     Mental Status: She is alert.  Psychiatric:        Mood and Affect: Mood normal.        Behavior: Behavior normal.     Outpatient Encounter Medications as of 11/21/2021  Medication Sig   albuterol (PROVENTIL HFA;VENTOLIN HFA) 108 (90 Base) MCG/ACT inhaler Inhale 2 puffs into the lungs every 6 (six) hours as needed for wheezing or shortness of breath.   atorvastatin (LIPITOR) 10 MG tablet Take 1 tablet (10 mg total) by mouth daily.   Calcium Citrate-Vitamin D (CITRACAL + D PO) Take by mouth.   Cholecalciferol (VITAMIN D3) 1000 units CAPS Take 1 capsule by mouth daily.     Cyanocobalamin (B-12)  250 MCG TABS Take 250 mcg by mouth daily.   fluticasone (FLONASE) 50 MCG/ACT nasal spray Place 2 sprays into the nose as needed for rhinitis.   lisinopril (ZESTRIL) 20 MG tablet Take 1 tablet (20 mg total) by mouth daily.   loratadine (CLARITIN) 10 MG tablet Take 10 mg by mouth daily.   metFORMIN (GLUCOPHAGE-XR) 500 MG 24 hr tablet Take 1 tablet (500 mg total) by mouth 2 (two) times daily.   vitamin E 400 UNIT capsule Take 400 Units by mouth daily.   No facility-administered encounter medications on file as of 11/21/2021.     Lab Results  Component Value Date   WBC 4.2 03/15/2021   HGB 13.8 03/15/2021   HCT 40.7 03/15/2021   PLT 196.0 03/15/2021   GLUCOSE 97 11/19/2021   CHOL 161 11/19/2021   TRIG 165.0 (H) 11/19/2021   HDL 48.90 11/19/2021   LDLDIRECT 167.0 02/15/2020   LDLCALC 79 11/19/2021   ALT 30 11/19/2021   AST 25 11/19/2021   NA 134 (L) 11/19/2021   K 4.4 11/19/2021   CL 98 11/19/2021   CREATININE 0.86 11/19/2021   BUN 17 11/19/2021   CO2 28 11/19/2021   TSH 1.88 11/19/2021   INR 1.0 07/13/2017   HGBA1C 5.9 11/19/2021   MICROALBUR <0.7 07/19/2021       Assessment & Plan:   Problem List Items Addressed This Visit   None    Einar Pheasant, MD

## 2021-12-01 ENCOUNTER — Encounter: Payer: Self-pay | Admitting: Internal Medicine

## 2021-12-01 NOTE — Assessment & Plan Note (Signed)
Continue lisinopril.  Follow pressures.  Follow metabolic panel.  ?

## 2021-12-01 NOTE — Assessment & Plan Note (Signed)
Saw urology (Dr Erlene Quan) 06/15/20.  Note reviewed.  Recommended f/u renal ultrasound in 2 years.   ?

## 2021-12-01 NOTE — Assessment & Plan Note (Signed)
No residual problems.   ?

## 2021-12-01 NOTE — Assessment & Plan Note (Signed)
Follow cbc.  

## 2021-12-01 NOTE — Assessment & Plan Note (Signed)
S/p liver biopsy - NASH.  Being followed by GI.   Has lost weight.  Continue diet and weight loss.  Follow liver function tests.  Recheck check improved/normal.  Continue diet and weight loss.   ?

## 2021-12-01 NOTE — Assessment & Plan Note (Signed)
On metformin.  a1c just checked - improved - 5.9.   Continue low carb diet and exercise.  Follow met b and a1c.  ?

## 2021-12-01 NOTE — Assessment & Plan Note (Signed)
Overall appears to be handling things relatively well.  Follow.   ?

## 2021-12-01 NOTE — Assessment & Plan Note (Signed)
On lipitor.  Low cholesterol diet and exercise.  Follow lipid panel and liver function tests.   

## 2021-12-18 LAB — HM DIABETES EYE EXAM

## 2021-12-24 ENCOUNTER — Other Ambulatory Visit: Payer: Self-pay | Admitting: Internal Medicine

## 2021-12-25 ENCOUNTER — Ambulatory Visit
Admission: RE | Admit: 2021-12-25 | Discharge: 2021-12-25 | Disposition: A | Discharge status: Home / Self Care | Payer: BC Managed Care – PPO | Source: Ambulatory Visit | Attending: Emergency Medicine | Admitting: Emergency Medicine

## 2021-12-25 VITALS — BP 146/79 | HR 68 | Temp 98.3°F | Resp 20 | Ht 62.0 in | Wt 197.0 lb

## 2021-12-25 DIAGNOSIS — J0100 Acute maxillary sinusitis, unspecified: Secondary | ICD-10-CM

## 2021-12-25 MED ORDER — DOXYCYCLINE HYCLATE 100 MG PO CAPS: 100.0000 mg | ORAL_CAPSULE | Freq: Two times a day (BID) | ORAL | 0 refills | Status: AC
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
## 2021-12-25 NOTE — Discharge Instructions (Signed)
Your symptoms today are most likely being caused by a sinus infection, sinus infections are typically caused by viruses meaning they must resolve on their own with time however when symptoms are prolonged it is reasonable to suspect possible bacterial involvement therefore we can use an antibiotic ? ?Continue use of Mucinex, this medication helps to thin secretions ? ?Begin use of a daily antihistamine such as Claritin or Zyrtec, this medication helps to reduce the amount of secretions produced by the body ? ?Begin use of Flonase twice daily, this medication will help to clear congestion from the sinus passages ? ?On Sunday, December 29, 2021 if symptoms have continued to persist without any improvement you may begin use of doxycycline twice daily for the next 10 days to cover for bacteria ? ?You may follow-up with urgent care as needed ?

## 2021-12-25 NOTE — ED Triage Notes (Signed)
Patient is here for "ha, congestion, now s/t, low grade fever at times" & "hoarse voice". Slight cough "at times" (no sob). Symptoms started "Monday evening with scratchy throat". No new/unexplained rash.  ?

## 2022-01-07 ENCOUNTER — Other Ambulatory Visit: Payer: Self-pay | Admitting: Internal Medicine

## 2022-02-03 ENCOUNTER — Other Ambulatory Visit: Payer: Self-pay | Admitting: Internal Medicine

## 2022-02-19 ENCOUNTER — Encounter: Payer: Self-pay | Admitting: Internal Medicine

## 2022-02-21 ENCOUNTER — Encounter: Payer: Self-pay | Admitting: Internal Medicine

## 2022-02-21 ENCOUNTER — Ambulatory Visit: Payer: BC Managed Care – PPO | Admitting: Internal Medicine

## 2022-02-21 ENCOUNTER — Other Ambulatory Visit (HOSPITAL_COMMUNITY)
Admission: RE | Admit: 2022-02-21 | Discharge: 2022-02-21 | Disposition: A | Discharge status: Home / Self Care | Payer: BC Managed Care – PPO | Source: Ambulatory Visit | Attending: Internal Medicine | Admitting: Internal Medicine

## 2022-02-21 DIAGNOSIS — I10 Essential (primary) hypertension: Secondary | ICD-10-CM | POA: Diagnosis not present

## 2022-02-21 DIAGNOSIS — E1165 Type 2 diabetes mellitus with hyperglycemia: Secondary | ICD-10-CM

## 2022-02-21 DIAGNOSIS — D649 Anemia, unspecified: Secondary | ICD-10-CM

## 2022-02-21 DIAGNOSIS — N939 Abnormal uterine and vaginal bleeding, unspecified: Secondary | ICD-10-CM | POA: Insufficient documentation

## 2022-02-21 NOTE — Progress Notes (Incomplete Revision)
[name], female   DOB: Aug 21, 1961, 61 y.o.   MRN: [id number]   Subjective:    [name], female    DOB: Jun 08, 1961, 61 y.o.   MRN: [id number]   Patient here for work in appt.   Chief Complaint  Patient presents with  . Follow-up    Vaginal spotting   .   HPI Noticed a couple of days ago - Wednesday - some constipation.  Some straining.  Noticed some spotting in underwear.  Wiped - noticed vaginal bleeding.  Denies any rectal bleeding.  States - when wiped - noticed a minimal amount of BRB on tissue.  No bleeding since.  No chest pain or sob reported.  No abdominal pain or cramping.  Bowels moving better now.  No cough  or congestion. No chest pain or sob.  No abdominal pain or cramping.  No dysuria.  No hematuria.     Past Medical History:  Diagnosis Date  . Abnormal liver function   . Allergy   . Anemia   . GERD (gastroesophageal reflux disease)   . Heart murmur   . Hypercholesterolemia   . Hyperglycemia   . Hypertension    Past Surgical History:  Procedure Laterality Date  . ABDOMINAL HYSTERECTOMY  2010  . ACHILLES TENDON REPAIR  2009  . BREAST BIOPSY Left 07/30/2006   benign  . BREAST BIOPSY Left 02/18/2007 & 03/05/2007   benign  . BREAST EXCISIONAL BIOPSY    . BREAST SURGERY  2008  . LUMBAR MICRODISCECTOMY  2004  . ROTATOR CUFF REPAIR  2006  . SEPTOPLASTY  1990  . TONSILLECTOMY AND ADENOIDECTOMY  1969   Family History  Problem Relation Age of Onset  . Hyperlipidemia Mother   . Hypertension Mother   . Stroke Mother   . Heart disease Mother   . Hypothyroidism Mother   . Pernicious anemia Mother        aunt x 2  . Hyperlipidemia Maternal Grandmother   . Ulcers Maternal Grandmother   . Lung cancer Father        Hx smoker  . Hypertension Maternal Grandfather        MI  . Heart disease Maternal Grandfather        myocardial infarction  . Kidney cancer Paternal Grandmother   . Ovarian cancer Maternal Aunt   . Breast cancer  Paternal Aunt   . Diabetes Other   . Hypothyroidism Other        aunt x 2  . Colon cancer Neg Hx    Social History   Socioeconomic History  . Marital status: Single    Spouse name: Not on file  . Number of children: 0  . Years of education: Not on file  . Highest education level: Not on file  Occupational History  . Not on file  Tobacco Use  . Smoking status: Never  . Smokeless tobacco: Never  Vaping Use  . Vaping Use: Never used  Substance and Sexual Activity  . Alcohol use: Not Currently    Comment: rare  . Drug use: No  . Sexual activity: Not on file  Other Topics Concern  . Not on file  Social History Narrative   She is single and has no children. She works at [employer] as an Web designer.    Social Determinants of Health   Financial Resource Strain: Low Risk  (06/14/2020)   Overall Financial Resource Strain (CARDIA)   . Difficulty  of Paying Living Expenses: Not hard at all  Food Insecurity: Not on file  Transportation Needs: Not on file  Physical Activity: Not on file  Stress: Not on file  Social Connections: Not on file     Review of Systems  Constitutional:  Negative for appetite change and unexpected weight change.  HENT:  Negative for congestion and sinus pressure.   Respiratory:  Negative for cough, chest tightness and shortness of breath.   Cardiovascular:  Negative for chest pain, palpitations and leg swelling.  Gastrointestinal:  Negative for abdominal pain, diarrhea, nausea and vomiting.  Genitourinary:  Negative for difficulty urinating and dysuria.  Musculoskeletal:  Negative for joint swelling and myalgias.  Skin:  Negative for color change and rash.  Neurological:  Negative for dizziness, light-headedness and headaches.  Psychiatric/Behavioral:  Negative for agitation and dysphoric mood.        Objective:     BP 140/78 (BP Location: Left Arm, Patient Position: Sitting, Cuff Size: Small)   Pulse 65   Temp 98.7 F  (37.1 C) (Temporal)   Resp 16   Ht _0  (1.575 m)   Wt 196 lb 9.6 oz (89.2 kg)   SpO2 100%   BMI 35.96 kg/m  Wt Readings from Last 3 Encounters:  02/21/22 196 lb 9.6 oz (89.2 kg)  12/25/21 197 lb (89.4 kg)  11/21/21 200 lb (90.7 kg)    Physical Exam Vitals reviewed.  Constitutional:      General: She is not in acute distress.    Appearance: Normal appearance.  HENT:     Head: Normocephalic and atraumatic.     Right Ear: External ear normal.     Left Ear: External ear normal.  Eyes:     General: No scleral icterus.       Right eye: No discharge.        Left eye: No discharge.     Conjunctiva/sclera: Conjunctivae normal.  Neck:     Thyroid: No thyromegaly.  Cardiovascular:     Rate and Rhythm: Normal rate and regular rhythm.  Pulmonary:     Effort: No respiratory distress.     Breath sounds: Normal breath sounds. No wheezing.  Abdominal:     General: Bowel sounds are normal.     Palpations: Abdomen is soft.     Tenderness: There is no abdominal tenderness.  Genitourinary:    Comments: Rectal exam:  heme negative. Normal external genitalia.  Vaginal vault without lesions.  S/p hysterectomy.  Cervix identified.  Pap performed. Could not appreciate any adnexal masses or tenderness.  No vaginal lesions.  Musculoskeletal:        General: No swelling or tenderness.     Cervical back: Neck supple. No tenderness.  Lymphadenopathy:     Cervical: No cervical adenopathy.  Skin:    Findings: No erythema or rash.  Neurological:     Mental Status: She is alert.  Psychiatric:        Mood and Affect: Mood normal.        Behavior: Behavior normal.     Outpatient Encounter Medications as of 02/21/2022  Medication Sig  . albuterol (PROVENTIL HFA;VENTOLIN HFA) 108 (90 Base) MCG/ACT inhaler Inhale 2 puffs into the lungs every 6 (six) hours as needed for wheezing or shortness of breath.  Marland Kitchen atorvastatin (LIPITOR) 10 MG tablet Take 1 tablet (10 mg total) by mouth daily.  . Calcium  Citrate-Vitamin D (CITRACAL + D PO) Take by mouth.  . Cholecalciferol (VITAMIN D3) 1000  units CAPS Take 1 capsule by mouth daily.   . Cyanocobalamin (B-12) 250 MCG TABS Take 250 mcg by mouth daily.  . fluticasone (FLONASE) 50 MCG/ACT nasal spray Place 2 sprays into the nose as needed for rhinitis.  Marland Kitchen lisinopril (ZESTRIL) 20 MG tablet Take 1 tablet (20 mg total) by mouth daily.  Marland Kitchen loratadine (CLARITIN) 10 MG tablet Take 10 mg by mouth daily.  . metFORMIN (GLUCOPHAGE-XR) 500 MG 24 hr tablet Take 1 tablet (500 mg total) by mouth 2 (two) times daily.  . vitamin E 400 UNIT capsule Take 400 Units by mouth daily.   No facility-administered encounter medications on file as of 02/21/2022.     Lab Results  Component Value Date   WBC 4.2 03/15/2021   HGB 13.8 03/15/2021   HCT 40.7 03/15/2021   PLT 196.0 03/15/2021   GLUCOSE 97 11/19/2021   CHOL 161 11/19/2021   TRIG 165.0 (H) 11/19/2021   HDL 48.90 11/19/2021   LDLDIRECT 167.0 02/15/2020   LDLCALC 79 11/19/2021   ALT 30 11/19/2021   AST 25 11/19/2021   NA 134 (L) 11/19/2021   K 4.4 11/19/2021   CL 98 11/19/2021   CREATININE 0.86 11/19/2021   BUN 17 11/19/2021   CO2 28 11/19/2021   TSH 1.88 11/19/2021   INR 1.0 07/13/2017   HGBA1C 5.9 11/19/2021   MICROALBUR <0.7 07/19/2021       Assessment & Plan:   Problem List Items Addressed This Visit     Anemia    Follow cbc.       Essential (primary) hypertension    Continue lisinopril.  Follow pressures.  Follow metabolic panel.       Type 2 diabetes mellitus with hyperglycemia (HCC)     On metformin.  Continue low carb diet and exercise.  Follow met b and a1c.       Vaginal bleeding    Reported noticing vaginal bleeding.  Is s/p hysterectomy.  Reported felt was coming from vaginal area.  Denies rectal bleeding.  Heme negative on exam.  Check urine to confirm no hematuria.  Keep bowels moving.       Relevant Orders   Urinalysis, Routine w reflex microscopic (Completed)    Cytology - PAP( [facility])     Einar Pheasant, MD

## 2022-02-21 NOTE — Progress Notes (Unsigned)
[name], female   DOB: 1960/10/23, 61 y.o.   MRN: [id number]   Subjective:    [name], female    DOB: Jun 20, 1961, 61 y.o.   MRN: [id number]  This visit occurred during the SARS-CoV-2 public health emergency.  Safety protocols were in place, including screening questions prior to the visit, additional usage of staff PPE, and extensive cleaning of exam room while observing appropriate contact time as indicated for disinfecting solutions.   Patient here for  Chief Complaint  Patient presents with   Follow-up    Vaginal spotting   .   HPI    Past Medical History:  Diagnosis Date   Abnormal liver function    Allergy    Anemia    GERD (gastroesophageal reflux disease)    Heart murmur    Hypercholesterolemia    Hyperglycemia    Hypertension    Past Surgical History:  Procedure Laterality Date   ABDOMINAL HYSTERECTOMY  2010   ACHILLES TENDON REPAIR  2009   BREAST BIOPSY Left 07/30/2006   benign   BREAST BIOPSY Left 02/18/2007 & 03/05/2007   benign   BREAST EXCISIONAL BIOPSY     BREAST SURGERY  2008   LUMBAR MICRODISCECTOMY  2004   ROTATOR CUFF REPAIR  2006   SEPTOPLASTY  1990   TONSILLECTOMY AND ADENOIDECTOMY  1969   Family History  Problem Relation Age of Onset   Hyperlipidemia Mother    Hypertension Mother    Stroke Mother    Heart disease Mother    Hypothyroidism Mother    Pernicious anemia Mother        aunt x 2   Hyperlipidemia Maternal Grandmother    Ulcers Maternal Grandmother    Lung cancer Father        Hx smoker   Hypertension Maternal Grandfather        MI   Heart disease Maternal Grandfather        myocardial infarction   Kidney cancer Paternal Grandmother    Ovarian cancer Maternal Aunt    Breast cancer Paternal Aunt    Diabetes Other    Hypothyroidism Other        aunt x 2   Colon cancer Neg Hx    Social History   Socioeconomic History   Marital status: Single    Spouse name: Not on file   Number of  children: 0   Years of education: Not on file   Highest education level: Not on file  Occupational History   Not on file  Tobacco Use   Smoking status: Never   Smokeless tobacco: Never  Vaping Use   Vaping Use: Never used  Substance and Sexual Activity   Alcohol use: Not Currently    Comment: rare   Drug use: No   Sexual activity: Not on file  Other Topics Concern   Not on file  Social History Narrative   She is single and has no children. She works at [employer] as an Web designer.    Social Determinants of Health   Financial Resource Strain: Low Risk  (06/14/2020)   Overall Financial Resource Strain (CARDIA)    Difficulty of Paying Living Expenses: Not hard at all  Food Insecurity: Not on file  Transportation Needs: Not on file  Physical Activity: Not on file  Stress: Not on file  Social Connections: Not on file     Review of Systems     Objective:  BP 140/78 (BP Location: Left Arm, Patient Position: Sitting, Cuff Size: Small)   Pulse 65   Temp 98.7 F (37.1 C) (Temporal)   Resp 16   Ht 5' 2"  (1.575 m)   Wt 196 lb 9.6 oz (89.2 kg)   SpO2 100%   BMI 35.96 kg/m  Wt Readings from Last 3 Encounters:  02/21/22 196 lb 9.6 oz (89.2 kg)  12/25/21 197 lb (89.4 kg)  11/21/21 200 lb (90.7 kg)    Physical Exam   Outpatient Encounter Medications as of 02/21/2022  Medication Sig   albuterol (PROVENTIL HFA;VENTOLIN HFA) 108 (90 Base) MCG/ACT inhaler Inhale 2 puffs into the lungs every 6 (six) hours as needed for wheezing or shortness of breath.   atorvastatin (LIPITOR) 10 MG tablet Take 1 tablet (10 mg total) by mouth daily.   Calcium Citrate-Vitamin D (CITRACAL + D PO) Take by mouth.   Cholecalciferol (VITAMIN D3) 1000 units CAPS Take 1 capsule by mouth daily.    Cyanocobalamin (B-12) 250 MCG TABS Take 250 mcg by mouth daily.   fluticasone (FLONASE) 50 MCG/ACT nasal spray Place 2 sprays into the nose as needed for rhinitis.   lisinopril  (ZESTRIL) 20 MG tablet Take 1 tablet (20 mg total) by mouth daily.   loratadine (CLARITIN) 10 MG tablet Take 10 mg by mouth daily.   metFORMIN (GLUCOPHAGE-XR) 500 MG 24 hr tablet Take 1 tablet (500 mg total) by mouth 2 (two) times daily.   vitamin E 400 UNIT capsule Take 400 Units by mouth daily.   No facility-administered encounter medications on file as of 02/21/2022.     Lab Results  Component Value Date   WBC 4.2 03/15/2021   HGB 13.8 03/15/2021   HCT 40.7 03/15/2021   PLT 196.0 03/15/2021   GLUCOSE 97 11/19/2021   CHOL 161 11/19/2021   TRIG 165.0 (H) 11/19/2021   HDL 48.90 11/19/2021   LDLDIRECT 167.0 02/15/2020   LDLCALC 79 11/19/2021   ALT 30 11/19/2021   AST 25 11/19/2021   NA 134 (L) 11/19/2021   K 4.4 11/19/2021   CL 98 11/19/2021   CREATININE 0.86 11/19/2021   BUN 17 11/19/2021   CO2 28 11/19/2021   TSH 1.88 11/19/2021   INR 1.0 07/13/2017   HGBA1C 5.9 11/19/2021   MICROALBUR <0.7 07/19/2021       Assessment & Plan:   Problem List Items Addressed This Visit   None    Einar Pheasant, MD

## 2022-02-22 ENCOUNTER — Encounter: Payer: Self-pay | Admitting: Internal Medicine

## 2022-02-22 LAB — URINALYSIS, ROUTINE W REFLEX MICROSCOPIC
Bilirubin, UA: NEGATIVE
Glucose, UA: NEGATIVE
Ketones, UA: NEGATIVE
Leukocytes,UA: NEGATIVE
Nitrite, UA: NEGATIVE
Protein,UA: NEGATIVE
RBC, UA: NEGATIVE
Specific Gravity, UA: 1.01 (ref 1.005–1.030)
Urobilinogen, Ur: 0.2 mg/dL (ref 0.2–1.0)
pH, UA: 5.5 (ref 5.0–7.5)

## 2022-02-22 NOTE — Assessment & Plan Note (Addendum)
Reported noticing vaginal bleeding.  Is s/p hysterectomy.  Reported felt was coming from vaginal area.  Denies rectal bleeding.  Heme negative on exam.  Check urine to confirm no hematuria.  Keep bowels moving.

## 2022-02-22 NOTE — Assessment & Plan Note (Signed)
Follow cbc.  

## 2022-02-22 NOTE — Assessment & Plan Note (Signed)
Continue lisinopril.  Follow pressures.  Follow metabolic panel.

## 2022-02-22 NOTE — Assessment & Plan Note (Signed)
On metformin.  Continue low carb diet and exercise.  Follow met b and a1c.

## 2022-02-24 ENCOUNTER — Telehealth: Payer: Self-pay

## 2022-02-24 NOTE — Telephone Encounter (Signed)
I tried to reach patient but her VM was set up to LM.

## 2022-02-25 LAB — CYTOLOGY - PAP
Comment: NEGATIVE
Diagnosis: NEGATIVE
High risk HPV: NEGATIVE

## 2022-03-04 ENCOUNTER — Other Ambulatory Visit: Payer: Self-pay

## 2022-03-04 DIAGNOSIS — N939 Abnormal uterine and vaginal bleeding, unspecified: Secondary | ICD-10-CM

## 2022-03-25 ENCOUNTER — Other Ambulatory Visit: Payer: BC Managed Care – PPO

## 2022-03-27 ENCOUNTER — Encounter: Payer: BC Managed Care – PPO | Admitting: Internal Medicine

## 2022-03-28 ENCOUNTER — Encounter: Payer: BC Managed Care – PPO | Admitting: Internal Medicine

## 2022-05-02 ENCOUNTER — Encounter: Payer: BC Managed Care – PPO | Admitting: Internal Medicine

## 2022-05-02 ENCOUNTER — Other Ambulatory Visit (INDEPENDENT_AMBULATORY_CARE_PROVIDER_SITE_OTHER): Payer: BC Managed Care – PPO

## 2022-05-02 DIAGNOSIS — Z114 Encounter for screening for human immunodeficiency virus [HIV]: Secondary | ICD-10-CM

## 2022-05-02 DIAGNOSIS — I10 Essential (primary) hypertension: Secondary | ICD-10-CM | POA: Diagnosis not present

## 2022-05-02 DIAGNOSIS — E7800 Pure hypercholesterolemia, unspecified: Secondary | ICD-10-CM

## 2022-05-02 DIAGNOSIS — Z1159 Encounter for screening for other viral diseases: Secondary | ICD-10-CM | POA: Diagnosis not present

## 2022-05-02 DIAGNOSIS — E1165 Type 2 diabetes mellitus with hyperglycemia: Secondary | ICD-10-CM | POA: Diagnosis not present

## 2022-05-02 LAB — CBC WITH DIFFERENTIAL/PLATELET
Basophils Absolute: 0 10*3/uL (ref 0.0–0.1)
Basophils Relative: 1 % (ref 0.0–3.0)
Eosinophils Absolute: 0.2 10*3/uL (ref 0.0–0.7)
Eosinophils Relative: 4.6 % (ref 0.0–5.0)
HCT: 40 % (ref 36.0–46.0)
Hemoglobin: 13.2 g/dL (ref 12.0–15.0)
Lymphocytes Relative: 23.9 % (ref 12.0–46.0)
Lymphs Abs: 1 10*3/uL (ref 0.7–4.0)
MCHC: 33.1 g/dL (ref 30.0–36.0)
MCV: 83.6 fl (ref 78.0–100.0)
Monocytes Absolute: 0.3 10*3/uL (ref 0.1–1.0)
Monocytes Relative: 7 % (ref 3.0–12.0)
Neutro Abs: 2.7 10*3/uL (ref 1.4–7.7)
Neutrophils Relative %: 63.5 % (ref 43.0–77.0)
Platelets: 247 10*3/uL (ref 150.0–400.0)
RBC: 4.79 Mil/uL (ref 3.87–5.11)
RDW: 13.7 % (ref 11.5–15.5)
WBC: 4.2 10*3/uL (ref 4.0–10.5)

## 2022-05-02 LAB — BASIC METABOLIC PANEL
BUN: 18 mg/dL (ref 6–23)
CO2: 26 mEq/L (ref 19–32)
Calcium: 9.2 mg/dL (ref 8.4–10.5)
Chloride: 98 mEq/L (ref 96–112)
Creatinine, Ser: 0.9 mg/dL (ref 0.40–1.20)
GFR: 69.02 mL/min (ref >60.00)
Glucose, Bld: 99 mg/dL (ref 70–99)
Potassium: 4.3 mEq/L (ref 3.5–5.1)
Sodium: 136 mEq/L (ref 135–145)

## 2022-05-02 LAB — LDL CHOLESTEROL, DIRECT: Direct LDL: 126 mg/dL

## 2022-05-02 LAB — LIPID PANEL
Cholesterol: 201 mg/dL — ABNORMAL HIGH (ref 0–200)
HDL: 46 mg/dL (ref >39.00)
NonHDL: 154.63
Total CHOL/HDL Ratio: 4
Triglycerides: 210 mg/dL — ABNORMAL HIGH (ref 0.0–149.0)
VLDL: 42 mg/dL — ABNORMAL HIGH (ref 0.0–40.0)

## 2022-05-02 LAB — HEPATIC FUNCTION PANEL
ALT: 18 U/L (ref 0–35)
AST: 20 U/L (ref 0–37)
Albumin: 4.1 g/dL (ref 3.5–5.2)
Alkaline Phosphatase: 52 U/L (ref 39–117)
Bilirubin, Direct: 0.1 mg/dL (ref 0.0–0.3)
Total Bilirubin: 0.4 mg/dL (ref 0.2–1.2)
Total Protein: 6.4 g/dL (ref 6.0–8.3)

## 2022-05-02 LAB — HEMOGLOBIN A1C: Hgb A1c MFr Bld: 6.1 % (ref 4.6–6.5)

## 2022-05-05 LAB — HIV ANTIBODY (ROUTINE TESTING W REFLEX): HIV 1&2 Ab, 4th Generation: NONREACTIVE

## 2022-05-05 LAB — HEPATITIS C ANTIBODY: Hepatitis C Ab: NONREACTIVE

## 2022-05-06 ENCOUNTER — Encounter: Payer: Self-pay | Admitting: Internal Medicine

## 2022-05-06 ENCOUNTER — Ambulatory Visit (INDEPENDENT_AMBULATORY_CARE_PROVIDER_SITE_OTHER): Payer: BC Managed Care – PPO | Admitting: Internal Medicine

## 2022-05-06 VITALS — BP 126/78 | HR 60 | Temp 97.6°F | Resp 16 | Ht 62.0 in | Wt 194.4 lb

## 2022-05-06 DIAGNOSIS — N281 Cyst of kidney, acquired: Secondary | ICD-10-CM

## 2022-05-06 DIAGNOSIS — I10 Essential (primary) hypertension: Secondary | ICD-10-CM | POA: Diagnosis not present

## 2022-05-06 DIAGNOSIS — N939 Abnormal uterine and vaginal bleeding, unspecified: Secondary | ICD-10-CM | POA: Diagnosis not present

## 2022-05-06 DIAGNOSIS — E1165 Type 2 diabetes mellitus with hyperglycemia: Secondary | ICD-10-CM

## 2022-05-06 DIAGNOSIS — E7800 Pure hypercholesterolemia, unspecified: Secondary | ICD-10-CM

## 2022-05-06 DIAGNOSIS — F439 Reaction to severe stress, unspecified: Secondary | ICD-10-CM

## 2022-05-06 DIAGNOSIS — D72819 Decreased white blood cell count, unspecified: Secondary | ICD-10-CM

## 2022-05-06 DIAGNOSIS — Z1231 Encounter for screening mammogram for malignant neoplasm of breast: Secondary | ICD-10-CM

## 2022-05-06 NOTE — Progress Notes (Signed)
[name], female   DOB: [birth year], 61 y.o.   MRN: [id number]   Subjective:    [name], female    DOB: September 26, 1960, 61 y.o.   MRN: [id number]   Patient here for her physical exam   Chief Complaint  Patient presents with   Annual Exam   .   HPI Reports she is doing relatively well.  Mucinex/robitussin DM - post nasal drainage.  Discussed stress.  Reclaim counseling - 1x/week.  Overall appears to be handling things well.  No chest pain or sob reported.  No abdominal pain.  Bowels moving.  She has lost some weight.    Past Medical History:  Diagnosis Date   Abnormal liver function    Allergy    Anemia    GERD (gastroesophageal reflux disease)    Heart murmur    Hypercholesterolemia    Hyperglycemia    Hypertension    Past Surgical History:  Procedure Laterality Date   ABDOMINAL HYSTERECTOMY  2010   ACHILLES TENDON REPAIR  2009   BREAST BIOPSY Left 07/30/2006   benign   BREAST BIOPSY Left 02/18/2007 & 03/05/2007   benign   BREAST EXCISIONAL BIOPSY     BREAST SURGERY  2008   LUMBAR MICRODISCECTOMY  2004   ROTATOR CUFF REPAIR  2006   SEPTOPLASTY  1990   TONSILLECTOMY AND ADENOIDECTOMY  1969   Family History  Problem Relation Age of Onset   Hyperlipidemia Mother    Hypertension Mother    Stroke Mother    Heart disease Mother    Hypothyroidism Mother    Pernicious anemia Mother        aunt x 2   Hyperlipidemia Maternal Grandmother    Ulcers Maternal Grandmother    Lung cancer Father        Hx smoker   Hypertension Maternal Grandfather        MI   Heart disease Maternal Grandfather        myocardial infarction   Kidney cancer Paternal Grandmother    Ovarian cancer Maternal Aunt    Breast cancer Paternal Aunt    Diabetes Other    Hypothyroidism Other        aunt x 2   Colon cancer Neg Hx    Social History   Socioeconomic History   Marital status: Single    Spouse name: Not on file   Number of children: 0   Years of  education: Not on file   Highest education level: Not on file  Occupational History   Not on file  Tobacco Use   Smoking status: Never   Smokeless tobacco: Never  Vaping Use   Vaping Use: Never used  Substance and Sexual Activity   Alcohol use: Not Currently    Comment: rare   Drug use: No   Sexual activity: Not on file  Other Topics Concern   Not on file  Social History Narrative   She is single and has no children. She works at [employer] as an Web designer.    Social Determinants of Health   Financial Resource Strain: Low Risk  (06/14/2020)   Overall Financial Resource Strain (CARDIA)    Difficulty of Paying Living Expenses: Not hard at all  Food Insecurity: Not on file  Transportation Needs: Not on file  Physical Activity: Not on file  Stress: Not on file  Social Connections: Not on file     Review of Systems  Constitutional:  Negative for appetite change and unexpected weight change.  HENT:  Negative for congestion and sinus pressure.   Respiratory:  Negative for cough, chest tightness and shortness of breath.   Cardiovascular:  Negative for chest pain, palpitations and leg swelling.  Gastrointestinal:  Negative for abdominal pain, diarrhea, nausea and vomiting.  Genitourinary:  Negative for difficulty urinating and dysuria.  Musculoskeletal:  Negative for joint swelling and myalgias.  Skin:  Negative for color change and rash.  Neurological:  Negative for dizziness, light-headedness and headaches.  Psychiatric/Behavioral:  Negative for agitation and dysphoric mood.        Objective:     BP 126/78   Pulse 60   Temp 97.6 F (36.4 C) (Temporal)   Resp 16   Ht _0  (1.575 m)   Wt 194 lb 6.4 oz (88.2 kg)   SpO2 98%   BMI 35.56 kg/m  Wt Readings from Last 3 Encounters:  05/06/22 194 lb 6.4 oz (88.2 kg)  02/21/22 196 lb 9.6 oz (89.2 kg)  12/25/21 197 lb (89.4 kg)    Physical Exam Vitals reviewed.  Constitutional:      General:  She is not in acute distress.    Appearance: Normal appearance. She is well-developed.  HENT:     Head: Normocephalic and atraumatic.     Right Ear: External ear normal.     Left Ear: External ear normal.  Eyes:     General: No scleral icterus.       Right eye: No discharge.        Left eye: No discharge.     Conjunctiva/sclera: Conjunctivae normal.  Neck:     Thyroid: No thyromegaly.  Cardiovascular:     Rate and Rhythm: Normal rate and regular rhythm.  Pulmonary:     Effort: No tachypnea, accessory muscle usage or respiratory distress.     Breath sounds: Normal breath sounds. No decreased breath sounds or wheezing.  Chest:  Breasts:    Right: No inverted nipple, mass, nipple discharge or tenderness (no axillary adenopathy).     Left: No inverted nipple, mass, nipple discharge or tenderness (no axilarry adenopathy).  Abdominal:     General: Bowel sounds are normal.     Palpations: Abdomen is soft.     Tenderness: There is no abdominal tenderness.  Genitourinary:    Comments: Just performed by gyn.  Musculoskeletal:        General: No swelling or tenderness.     Cervical back: Neck supple. No tenderness.  Lymphadenopathy:     Cervical: No cervical adenopathy.  Skin:    Findings: No erythema or rash.  Neurological:     Mental Status: She is alert and oriented to person, place, and time.  Psychiatric:        Mood and Affect: Mood normal.        Behavior: Behavior normal.      Outpatient Encounter Medications as of 05/06/2022  Medication Sig   albuterol (PROVENTIL HFA;VENTOLIN HFA) 108 (90 Base) MCG/ACT inhaler Inhale 2 puffs into the lungs every 6 (six) hours as needed for wheezing or shortness of breath.   atorvastatin (LIPITOR) 10 MG tablet Take 1 tablet (10 mg total) by mouth daily.   Biotin 1000 MCG CHEW Chew by mouth.   Calcium Citrate-Vitamin D (CITRACAL + D PO) Take by mouth.   Cholecalciferol (VITAMIN D3) 1000 units CAPS Take 1 capsule by mouth daily.     Cyanocobalamin (B-12) 250 MCG TABS Take 250 mcg by mouth  daily.   fluticasone (FLONASE) 50 MCG/ACT nasal spray Place 2 sprays into the nose as needed for rhinitis.   lisinopril (ZESTRIL) 20 MG tablet Take 1 tablet (20 mg total) by mouth daily.   loratadine (CLARITIN) 10 MG tablet Take 10 mg by mouth daily.   metFORMIN (GLUCOPHAGE-XR) 500 MG 24 hr tablet Take 1 tablet (500 mg total) by mouth 2 (two) times daily.   vitamin E 400 UNIT capsule Take 400 Units by mouth daily.   No facility-administered encounter medications on file as of 05/06/2022.     Lab Results  Component Value Date   WBC 4.2 05/02/2022   HGB 13.2 05/02/2022   HCT 40.0 05/02/2022   PLT 247.0 05/02/2022   GLUCOSE 99 05/02/2022   CHOL 201 (H) 05/02/2022   TRIG 210.0 (H) 05/02/2022   HDL 46.00 05/02/2022   LDLDIRECT 126.0 05/02/2022   LDLCALC 79 11/19/2021   ALT 18 05/02/2022   AST 20 05/02/2022   NA 136 05/02/2022   K 4.3 05/02/2022   CL 98 05/02/2022   CREATININE 0.90 05/02/2022   BUN 18 05/02/2022   CO2 26 05/02/2022   TSH 1.88 11/19/2021   INR 1.0 07/13/2017   HGBA1C 6.1 05/02/2022   MICROALBUR <0.7 07/19/2021       Assessment & Plan:   Problem List Items Addressed This Visit     Essential (primary) hypertension    Continue lisinopril.  Follow pressures.  Follow metabolic panel.       Leukopenia    Follow cbc.       Pure hypercholesterolemia    On lipitor.  Low cholesterol diet and exercise.  Follow lipid panel and liver function tests.        Renal cyst    Saw urology (Dr Erlene Quan) 06/15/20.  Note reviewed.  Recommended f/u renal ultrasound in 2 years.        Stress    Overall appears to be handling things relatively well.  Follow.        Type 2 diabetes mellitus with hyperglycemia (HCC)     On metformin.  Continue low carb diet and exercise.  Follow met b and a1c.       Vaginal bleeding    Is s/p hysterectomy.  Saw gyn.  Recommended to follow.       Other Visit Diagnoses      Encounter for screening mammogram for malignant neoplasm of breast    -  Primary   Relevant Orders   MM 3D SCREEN BREAST BILATERAL        Einar Pheasant, MD

## 2022-05-11 ENCOUNTER — Encounter: Payer: Self-pay | Admitting: Internal Medicine

## 2022-05-11 NOTE — Assessment & Plan Note (Signed)
On metformin.  Continue low carb diet and exercise.  Follow met b and a1c.  

## 2022-05-11 NOTE — Assessment & Plan Note (Signed)
On lipitor.  Low cholesterol diet and exercise.  Follow lipid panel and liver function tests.   

## 2022-05-11 NOTE — Assessment & Plan Note (Signed)
Is s/p hysterectomy.  Saw gyn.  Recommended to follow.

## 2022-05-11 NOTE — Assessment & Plan Note (Signed)
Continue lisinopril.  Follow pressures.  Follow metabolic panel.

## 2022-05-11 NOTE — Assessment & Plan Note (Signed)
Follow cbc.  

## 2022-05-11 NOTE — Assessment & Plan Note (Signed)
Overall appears to be handling things relatively well.  Follow.

## 2022-05-11 NOTE — Assessment & Plan Note (Signed)
Saw urology (Dr Erlene Quan) 06/15/20.  Note reviewed.  Recommended f/u renal ultrasound in 2 years.

## 2022-06-07 ENCOUNTER — Other Ambulatory Visit: Payer: Self-pay | Admitting: Internal Medicine

## 2022-06-30 ENCOUNTER — Ambulatory Visit
Admission: RE | Admit: 2022-06-30 | Discharge: 2022-06-30 | Disposition: A | Discharge status: Home / Self Care | Payer: BC Managed Care – PPO | Source: Ambulatory Visit | Attending: Internal Medicine | Admitting: Internal Medicine

## 2022-06-30 ENCOUNTER — Ambulatory Visit (INDEPENDENT_AMBULATORY_CARE_PROVIDER_SITE_OTHER): Payer: BC Managed Care – PPO

## 2022-06-30 DIAGNOSIS — Z23 Encounter for immunization: Secondary | ICD-10-CM

## 2022-06-30 DIAGNOSIS — Z1231 Encounter for screening mammogram for malignant neoplasm of breast: Secondary | ICD-10-CM | POA: Diagnosis present

## 2022-07-03 ENCOUNTER — Other Ambulatory Visit: Payer: Self-pay | Admitting: Internal Medicine

## 2022-07-30 ENCOUNTER — Encounter: Payer: Self-pay | Admitting: Internal Medicine

## 2022-08-09 ENCOUNTER — Other Ambulatory Visit: Payer: Self-pay | Admitting: Internal Medicine

## 2022-08-18 ENCOUNTER — Other Ambulatory Visit: Payer: Self-pay | Admitting: Internal Medicine

## 2022-09-02 ENCOUNTER — Other Ambulatory Visit: Payer: BC Managed Care – PPO

## 2022-09-05 ENCOUNTER — Other Ambulatory Visit: Payer: Self-pay | Admitting: Internal Medicine

## 2022-09-05 ENCOUNTER — Ambulatory Visit: Payer: BC Managed Care – PPO | Admitting: Internal Medicine

## 2022-09-17 ENCOUNTER — Other Ambulatory Visit (INDEPENDENT_AMBULATORY_CARE_PROVIDER_SITE_OTHER): Payer: BC Managed Care – PPO

## 2022-09-17 DIAGNOSIS — E7800 Pure hypercholesterolemia, unspecified: Secondary | ICD-10-CM

## 2022-09-17 DIAGNOSIS — I10 Essential (primary) hypertension: Secondary | ICD-10-CM | POA: Diagnosis not present

## 2022-09-17 DIAGNOSIS — E1165 Type 2 diabetes mellitus with hyperglycemia: Secondary | ICD-10-CM

## 2022-09-17 LAB — BASIC METABOLIC PANEL
BUN: 20 mg/dL (ref 6–23)
CO2: 27 mEq/L (ref 19–32)
Calcium: 9.5 mg/dL (ref 8.4–10.5)
Chloride: 100 mEq/L (ref 96–112)
Creatinine, Ser: 0.87 mg/dL (ref 0.40–1.20)
GFR: 71.69 mL/min (ref >60.00)
Glucose, Bld: 112 mg/dL — ABNORMAL HIGH (ref 70–99)
Potassium: 4.7 mEq/L (ref 3.5–5.1)
Sodium: 137 mEq/L (ref 135–145)

## 2022-09-17 LAB — LIPID PANEL
Cholesterol: 185 mg/dL (ref 0–200)
HDL: 52.5 mg/dL (ref >39.00)
LDL Cholesterol: 106 mg/dL — ABNORMAL HIGH (ref 0–99)
NonHDL: 132.87
Total CHOL/HDL Ratio: 4
Triglycerides: 132 mg/dL (ref 0.0–149.0)
VLDL: 26.4 mg/dL (ref 0.0–40.0)

## 2022-09-17 LAB — HEPATIC FUNCTION PANEL
ALT: 19 U/L (ref 0–35)
AST: 16 U/L (ref 0–37)
Albumin: 4.2 g/dL (ref 3.5–5.2)
Alkaline Phosphatase: 59 U/L (ref 39–117)
Bilirubin, Direct: 0.1 mg/dL (ref 0.0–0.3)
Total Bilirubin: 0.3 mg/dL (ref 0.2–1.2)
Total Protein: 6.4 g/dL (ref 6.0–8.3)

## 2022-09-17 LAB — HEMOGLOBIN A1C: Hgb A1c MFr Bld: 6.1 % (ref 4.6–6.5)

## 2022-09-17 LAB — TSH: TSH: 3.31 u[IU]/mL (ref 0.35–5.50)

## 2022-09-19 ENCOUNTER — Encounter: Payer: Self-pay | Admitting: Internal Medicine

## 2022-09-19 ENCOUNTER — Ambulatory Visit (INDEPENDENT_AMBULATORY_CARE_PROVIDER_SITE_OTHER): Payer: BC Managed Care – PPO | Admitting: Internal Medicine

## 2022-09-19 VITALS — BP 138/84 | HR 64 | Temp 97.9°F | Ht 62.0 in | Wt 191.0 lb

## 2022-09-19 DIAGNOSIS — F439 Reaction to severe stress, unspecified: Secondary | ICD-10-CM

## 2022-09-19 DIAGNOSIS — E7800 Pure hypercholesterolemia, unspecified: Secondary | ICD-10-CM | POA: Diagnosis not present

## 2022-09-19 DIAGNOSIS — D649 Anemia, unspecified: Secondary | ICD-10-CM

## 2022-09-19 DIAGNOSIS — E1165 Type 2 diabetes mellitus with hyperglycemia: Secondary | ICD-10-CM

## 2022-09-19 DIAGNOSIS — K7689 Other specified diseases of liver: Secondary | ICD-10-CM

## 2022-09-19 DIAGNOSIS — I10 Essential (primary) hypertension: Secondary | ICD-10-CM | POA: Diagnosis not present

## 2022-09-19 DIAGNOSIS — N281 Cyst of kidney, acquired: Secondary | ICD-10-CM

## 2022-09-19 MED ORDER — SERTRALINE HCL 50 MG PO TABS: ORAL_TABLET | ORAL | 2 refills | Status: DC

## 2022-09-19 NOTE — Progress Notes (Signed)
Subjective:    [name], female    DOB: Jul 28, 1961, 62 y.o.   MRN: [id number]  Patient here for  Chief Complaint  Patient presents with   Diabetes   Hypertension    HPI Reports increased stress.  Discussed.  Has been seeing a therapist for the last couple of years. Planning to see a new therapist next week.  She has been clinching her teeth.  Hard to relax.  Will wake up at times - heart racing - feels related to increased stress anxiety.  Feels needs to be on something to help level things out.  Discussed treatment options.  No chest pain or sob reported.  No abdominal pain or bowel change reported.  Had local reaction to flu vaccine.  Discussed recent labs.  Has adjusted diet and been watching what she eats.    Past Medical History:  Diagnosis Date   Abnormal liver function    Allergy    Anemia    GERD (gastroesophageal reflux disease)    Heart murmur    Hypercholesterolemia    Hyperglycemia    Hypertension    Past Surgical History:  Procedure Laterality Date   ABDOMINAL HYSTERECTOMY  2010   ACHILLES TENDON REPAIR  2009   BREAST BIOPSY Left 07/30/2006   benign   BREAST BIOPSY Left 02/18/2007 & 03/05/2007   benign   BREAST EXCISIONAL BIOPSY     BREAST SURGERY  2008   LUMBAR MICRODISCECTOMY  2004   ROTATOR CUFF REPAIR  2006   SEPTOPLASTY  1990   TONSILLECTOMY AND ADENOIDECTOMY  1969   Family History  Problem Relation Age of Onset   Hyperlipidemia Mother    Hypertension Mother    Stroke Mother    Heart disease Mother    Hypothyroidism Mother    Pernicious anemia Mother        aunt x 2   Hyperlipidemia Maternal Grandmother    Ulcers Maternal Grandmother    Lung cancer Father        Hx smoker   Hypertension Maternal Grandfather        MI   Heart disease Maternal Grandfather        myocardial infarction   Kidney cancer Paternal Grandmother    Ovarian cancer Maternal Aunt    Breast cancer Paternal Aunt    Diabetes Other    Hypothyroidism  Other        aunt x 2   Colon cancer Neg Hx    Social History   Socioeconomic History   Marital status: Single    Spouse name: Not on file   Number of children: 0   Years of education: Not on file   Highest education level: Not on file  Occupational History   Not on file  Tobacco Use   Smoking status: Never   Smokeless tobacco: Never  Vaping Use   Vaping Use: Never used  Substance and Sexual Activity   Alcohol use: Not Currently    Comment: rare   Drug use: No   Sexual activity: Not on file  Other Topics Concern   Not on file  Social History Narrative   She is single and has no children. 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    Social Determinants of Health   Financial Resource Strain: Low Risk  (06/14/2020)   Overall Financial Resource Strain (CARDIA)    Difficulty of Paying Living Expenses: Not hard at all  Food Insecurity: Not on  file  Transportation Needs: Not on file  Physical Activity: Not on file  Stress: Not on file  Social Connections: Not on file     Review of Systems  Constitutional:  Negative for appetite change and unexpected weight change.  HENT:  Negative for congestion and sinus pressure.   Respiratory:  Negative for cough, chest tightness and shortness of breath.   Cardiovascular:  Negative for chest pain, palpitations and leg swelling.  Gastrointestinal:  Negative for abdominal pain, diarrhea, nausea and vomiting.  Genitourinary:  Negative for difficulty urinating and dysuria.  Musculoskeletal:  Negative for joint swelling and myalgias.  Skin:  Negative for color change and rash.  Neurological:  Negative for dizziness and headaches.  Psychiatric/Behavioral:  Negative for agitation.        Increased stress and anxiety as outlined.        Objective:     BP 138/84   Pulse 64   Temp 97.9 F (36.6 C)   Ht 5\' 2"  (1.575 m)   Wt 191 lb (86.6 kg)   SpO2 99%   BMI 34.93 kg/m  Wt Readings from Last 3 Encounters:   09/19/22 191 lb (86.6 kg)  05/06/22 194 lb 6.4 oz (88.2 kg)  02/21/22 196 lb 9.6 oz (89.2 kg)    Physical Exam Vitals reviewed.  Constitutional:      General: She is not in acute distress.    Appearance: Normal appearance.  HENT:     Head: Normocephalic and atraumatic.     Right Ear: External ear normal.     Left Ear: External ear normal.  Eyes:     General: No scleral icterus.       Right eye: No discharge.        Left eye: No discharge.     Conjunctiva/sclera: Conjunctivae normal.  Neck:     Thyroid: No thyromegaly.  Cardiovascular:     Rate and Rhythm: Normal rate and regular rhythm.  Pulmonary:     Effort: No respiratory distress.     Breath sounds: Normal breath sounds. No wheezing.  Abdominal:     General: Bowel sounds are normal.     Palpations: Abdomen is soft.     Tenderness: There is no abdominal tenderness.  Musculoskeletal:        General: No swelling or tenderness.     Cervical back: Neck supple. No tenderness.  Lymphadenopathy:     Cervical: No cervical adenopathy.  Skin:    Findings: No erythema or rash.  Neurological:     Mental Status: She is alert.  Psychiatric:        Mood and Affect: Mood normal.        Behavior: Behavior normal.      Outpatient Encounter Medications as of 09/19/2022  Medication Sig   albuterol (PROVENTIL HFA;VENTOLIN HFA) 108 (90 Base) MCG/ACT inhaler Inhale 2 puffs into the lungs every 6 (six) hours as needed for wheezing or shortness of breath.   atorvastatin (LIPITOR) 10 MG tablet Take 1 tablet (10 mg total) by mouth daily.   Biotin 1000 MCG CHEW Chew by mouth.   Cholecalciferol (VITAMIN D3) 1000 units CAPS Take 1 capsule by mouth daily.    Cyanocobalamin (B-12) 250 MCG TABS Take 250 mcg by mouth daily.   fluticasone (FLONASE) 50 MCG/ACT nasal spray Place 2 sprays into the nose as needed for rhinitis.   lisinopril (ZESTRIL) 20 MG tablet Take 1 tablet (20 mg total) by mouth daily.   loratadine (CLARITIN) 10 MG  tablet Take  10 mg by mouth daily.   metFORMIN (GLUCOPHAGE-XR) 500 MG 24 hr tablet Take 1 tablet (500 mg total) by mouth 2 (two) times daily.   sertraline (ZOLOFT) 50 MG tablet 1/2 tablet q day x 1 week and then one po q day   vitamin E 400 UNIT capsule Take 400 Units by mouth daily.   [DISCONTINUED] Calcium Citrate-Vitamin D (CITRACAL + D PO) Take by mouth. (Patient not taking: Reported on 09/19/2022)   No facility-administered encounter medications on file as of 09/19/2022.     Lab Results  Component Value Date   WBC 4.2 05/02/2022   HGB 13.2 05/02/2022   HCT 40.0 05/02/2022   PLT 247.0 05/02/2022   GLUCOSE 112 (H) 09/17/2022   CHOL 185 09/17/2022   TRIG 132.0 09/17/2022   HDL 52.50 09/17/2022   LDLDIRECT 126.0 05/02/2022   LDLCALC 106 (H) 09/17/2022   ALT 19 09/17/2022   AST 16 09/17/2022   NA 137 09/17/2022   K 4.7 09/17/2022   CL 100 09/17/2022   CREATININE 0.87 09/17/2022   BUN 20 09/17/2022   CO2 27 09/17/2022   TSH 3.31 09/17/2022   INR 1.0 07/13/2017   HGBA1C 6.1 09/17/2022   MICROALBUR <0.7 07/19/2021    MM 3D SCREEN BREAST BILATERAL  Result Date: 07/02/2022 CLINICAL DATA:  Screening. EXAM: DIGITAL SCREENING BILATERAL MAMMOGRAM WITH TOMOSYNTHESIS AND CAD TECHNIQUE: Bilateral screening digital craniocaudal and mediolateral oblique mammograms were obtained. Bilateral screening digital breast tomosynthesis was performed. The images were evaluated with computer-aided detection. COMPARISON:  Previous exam(s). ACR Breast Density Category b: There are scattered areas of fibroglandular density. FINDINGS: There are no findings suspicious for malignancy. IMPRESSION: No mammographic evidence of malignancy. A result letter of this screening mammogram will be mailed directly to the patient. RECOMMENDATION: Screening mammogram in one year. (Code:SM-B-01Y) BI-RADS CATEGORY  1: Negative. Electronically Signed   By: Edwin Cap M.D.   On: 07/02/2022 09:09       Assessment & Plan:  Essential  (primary) hypertension Assessment & Plan: Continues on lisinopril.  Treat stress and anxiety as outlined.  See if blood pressure improves.  Hold on making changes at this time.  Follow.  Spot check pressures and send in.  Follow metabolic panel.    Anemia, unspecified type Assessment & Plan: Follow cbc.    Other specified diseases of liver Assessment & Plan: S/p liver biopsy - NASH.  Being followed by GI.   Has lost weight.  Continue diet and weight loss.  Follow liver function tests.  Recheck normal.  Continue diet and weight loss.     Pure hypercholesterolemia Assessment & Plan: On lipitor.  Low cholesterol diet and exercise.  Follow lipid panel and liver function tests.     Renal cyst Assessment & Plan: Saw urology (Dr Apolinar Junes) 06/15/20.  Note reviewed.  Recommended f/u renal ultrasound in 2 years.  Had abdominal ultrasound 2022 - stable cyst.  Will need continued f/u with urology.    Stress Assessment & Plan: Increased stress and anxiety as outlined.  Feels needs something to help level things out.  Discussed treatment options.  Start zoloft as directed.  Follow closely. Planning to see a new therapist next week.    Type 2 diabetes mellitus with hyperglycemia, without long-term current use of insulin ([facility]) Assessment & Plan:  On metformin.  Continue low carb diet and exercise.  Follow met b and a1c.  Lab Results  Component Value Date   HGBA1C 6.1 09/17/2022  Other orders -     Sertraline HCl; 1/2 tablet q day x 1 week and then one po q day  Dispense: 30 tablet; Refill: 2     Dale [place], MD

## 2022-09-21 ENCOUNTER — Telehealth: Payer: Self-pay | Admitting: Internal Medicine

## 2022-09-21 ENCOUNTER — Encounter: Payer: Self-pay | Admitting: Internal Medicine

## 2022-09-21 NOTE — Telephone Encounter (Signed)
Needs a 6 week f/u. Please call and schedule.  Thanks.

## 2022-09-21 NOTE — Assessment & Plan Note (Addendum)
Saw urology (Dr Brandon) 06/15/20.  Note reviewed.  Recommended f/u renal ultrasound in 2 years.  Had abdominal ultrasound 2022 - stable cyst.  Will need continued f/u with urology.  

## 2022-09-21 NOTE — Assessment & Plan Note (Signed)
S/p liver biopsy - NASH.  Being followed by GI.   Has lost weight.  Continue diet and weight loss.  Follow liver function tests.  Recheck normal.  Continue diet and weight loss.

## 2022-09-21 NOTE — Assessment & Plan Note (Addendum)
Increased stress and anxiety as outlined.  Feels needs something to help level things out.  Discussed treatment options.  Start zoloft as directed.  Follow closely. Planning to see a new therapist next week.

## 2022-09-21 NOTE — Assessment & Plan Note (Signed)
On lipitor.  Low cholesterol diet and exercise.  Follow lipid panel and liver function tests.   

## 2022-09-21 NOTE — Assessment & Plan Note (Signed)
Continues on lisinopril.  Treat stress and anxiety as outlined.  See if blood pressure improves.  Hold on making changes at this time.  Follow.  Spot check pressures and send in.  Follow metabolic panel.

## 2022-09-21 NOTE — Assessment & Plan Note (Signed)
Follow cbc.  

## 2022-09-21 NOTE — Assessment & Plan Note (Addendum)
On metformin.  Continue low carb diet and exercise.  Follow met b and a1c.  Lab Results  Component Value Date   HGBA1C 6.1 09/17/2022    

## 2022-09-22 NOTE — Telephone Encounter (Signed)
Called and lm to call the office to schedule a 6w follow up from 09/19/22

## 2022-10-06 ENCOUNTER — Other Ambulatory Visit: Payer: Self-pay | Admitting: Internal Medicine

## 2022-11-07 ENCOUNTER — Other Ambulatory Visit: Payer: Self-pay | Admitting: Internal Medicine

## 2022-11-11 ENCOUNTER — Ambulatory Visit: Payer: BC Managed Care – PPO | Admitting: Internal Medicine

## 2022-11-14 ENCOUNTER — Encounter: Payer: Self-pay | Admitting: Internal Medicine

## 2022-11-14 ENCOUNTER — Ambulatory Visit: Payer: BC Managed Care – PPO | Admitting: Internal Medicine

## 2022-11-14 VITALS — BP 122/70 | HR 74 | Temp 98.0°F | Resp 16 | Ht 63.0 in | Wt 190.4 lb

## 2022-11-14 DIAGNOSIS — D649 Anemia, unspecified: Secondary | ICD-10-CM

## 2022-11-14 DIAGNOSIS — N281 Cyst of kidney, acquired: Secondary | ICD-10-CM

## 2022-11-14 DIAGNOSIS — E7800 Pure hypercholesterolemia, unspecified: Secondary | ICD-10-CM

## 2022-11-14 DIAGNOSIS — Z1211 Encounter for screening for malignant neoplasm of colon: Secondary | ICD-10-CM | POA: Diagnosis not present

## 2022-11-14 DIAGNOSIS — I10 Essential (primary) hypertension: Secondary | ICD-10-CM | POA: Diagnosis not present

## 2022-11-14 DIAGNOSIS — D72819 Decreased white blood cell count, unspecified: Secondary | ICD-10-CM

## 2022-11-14 DIAGNOSIS — F439 Reaction to severe stress, unspecified: Secondary | ICD-10-CM

## 2022-11-14 DIAGNOSIS — E1165 Type 2 diabetes mellitus with hyperglycemia: Secondary | ICD-10-CM

## 2022-11-14 MED ORDER — SERTRALINE HCL 50 MG PO TABS: 50.0000 mg | ORAL_TABLET | Freq: Every day | ORAL | 1 refills | Status: DC

## 2022-11-14 NOTE — Progress Notes (Unsigned)
Subjective:    [name], female    DOB: 10/08/1960, 62 y.o.   MRN: [id number]  Patient here for  Chief Complaint  Patient presents with   Medical Management of Chronic Issues    HPI Here to follow up regarding increased stress.  Was started on zoloft last visit.  Feels is helping.  Has more energy.  Sleeping better.  Overall feels is helping her handle the increased stress.  Still with stress, but doing better and feels better overall.  No chest pain or sob reported.  No cough or congestion.  No abdominal pain or bowel issues reported.     Past Medical History:  Diagnosis Date   Abnormal liver function    Allergy    Anemia    GERD (gastroesophageal reflux disease)    Heart murmur    Hypercholesterolemia    Hyperglycemia    Hypertension    Past Surgical History:  Procedure Laterality Date   ABDOMINAL HYSTERECTOMY  2010   ACHILLES TENDON REPAIR  2009   BREAST BIOPSY Left 07/30/2006   benign   BREAST BIOPSY Left 02/18/2007 & 03/05/2007   benign   BREAST EXCISIONAL BIOPSY     BREAST SURGERY  2008   LUMBAR MICRODISCECTOMY  2004   ROTATOR CUFF REPAIR  2006   SEPTOPLASTY  1990   TONSILLECTOMY AND ADENOIDECTOMY  1969   Family History  Problem Relation Age of Onset   Hyperlipidemia Mother    Hypertension Mother    Stroke Mother    Heart disease Mother    Hypothyroidism Mother    Pernicious anemia Mother        aunt x 2   Hyperlipidemia Maternal Grandmother    Ulcers Maternal Grandmother    Lung cancer Father        Hx smoker   Hypertension Maternal Grandfather        MI   Heart disease Maternal Grandfather        myocardial infarction   Kidney cancer Paternal Grandmother    Ovarian cancer Maternal Aunt    Breast cancer Paternal Aunt    Diabetes Other    Hypothyroidism Other        aunt x 2   Colon cancer Neg Hx    Social History   Socioeconomic History   Marital status: Single    Spouse name: Not on file   Number of children: 0   Years  of education: Not on file   Highest education level: Not on file  Occupational History   Not on file  Tobacco Use   Smoking status: Never   Smokeless tobacco: Never  Vaping Use   Vaping Use: Never used  Substance and Sexual Activity   Alcohol use: Not Currently    Comment: rare   Drug use: No   Sexual activity: Not on file  Other Topics Concern   Not on file  Social History Narrative   She is single and has no children. She works at [employer] as an Web designer.    Social Determinants of Health   Financial Resource Strain: Low Risk  (06/14/2020)   Overall Financial Resource Strain (CARDIA)    Difficulty of Paying Living Expenses: Not hard at all  Food Insecurity: Not on file  Transportation Needs: Not on file  Physical Activity: Not on file  Stress: Not on file  Social Connections: Not on file     Review of Systems  Constitutional:  Negative for  appetite change and unexpected weight change.  HENT:  Negative for congestion and sinus pressure.   Respiratory:  Negative for cough, chest tightness and shortness of breath.   Cardiovascular:  Negative for chest pain and palpitations.  Gastrointestinal:  Negative for abdominal pain, diarrhea, nausea and vomiting.  Genitourinary:  Negative for difficulty urinating and dysuria.  Musculoskeletal:  Negative for joint swelling and myalgias.  Skin:  Negative for color change and rash.  Neurological:  Negative for dizziness and headaches.  Psychiatric/Behavioral:  Negative for agitation and dysphoric mood.        Objective:     BP 122/70   Pulse 74   Temp 98 F (36.7 C)   Resp 16   Ht '5\' 3"'$  (1.6 m)   Wt 190 lb 6.4 oz (86.4 kg)   SpO2 97%   BMI 33.73 kg/m  Wt Readings from Last 3 Encounters:  11/14/22 190 lb 6.4 oz (86.4 kg)  09/19/22 191 lb (86.6 kg)  05/06/22 194 lb 6.4 oz (88.2 kg)    Physical Exam Vitals reviewed.  Constitutional:      General: She is not in acute distress.     Appearance: Normal appearance.  HENT:     Head: Normocephalic and atraumatic.     Right Ear: External ear normal.     Left Ear: External ear normal.  Eyes:     General: No scleral icterus.       Right eye: No discharge.        Left eye: No discharge.     Conjunctiva/sclera: Conjunctivae normal.  Neck:     Thyroid: No thyromegaly.  Cardiovascular:     Rate and Rhythm: Normal rate and regular rhythm.  Pulmonary:     Effort: No respiratory distress.     Breath sounds: Normal breath sounds. No wheezing.  Abdominal:     General: Bowel sounds are normal.     Palpations: Abdomen is soft.     Tenderness: There is no abdominal tenderness.  Musculoskeletal:        General: No swelling or tenderness.     Cervical back: Neck supple. No tenderness.  Lymphadenopathy:     Cervical: No cervical adenopathy.  Skin:    Findings: No erythema or rash.  Neurological:     Mental Status: She is alert.  Psychiatric:        Mood and Affect: Mood normal.        Behavior: Behavior normal.      Outpatient Encounter Medications as of 11/14/2022  Medication Sig   albuterol (PROVENTIL HFA;VENTOLIN HFA) 108 (90 Base) MCG/ACT inhaler Inhale 2 puffs into the lungs every 6 (six) hours as needed for wheezing or shortness of breath.   atorvastatin (LIPITOR) 10 MG tablet Take 1 tablet (10 mg total) by mouth daily.   Biotin 1000 MCG CHEW Chew by mouth.   Cholecalciferol (VITAMIN D3) 1000 units CAPS Take 1 capsule by mouth daily.    Cyanocobalamin (B-12) 250 MCG TABS Take 250 mcg by mouth daily.   fluticasone (FLONASE) 50 MCG/ACT nasal spray Place 2 sprays into the nose as needed for rhinitis.   lisinopril (ZESTRIL) 20 MG tablet Take 1 tablet (20 mg total) by mouth daily.   loratadine (CLARITIN) 10 MG tablet Take 10 mg by mouth daily.   metFORMIN (GLUCOPHAGE-XR) 500 MG 24 hr tablet Take 1 tablet (500 mg total) by mouth 2 (two) times daily.   sertraline (ZOLOFT) 50 MG tablet Take 1 tablet (50 mg total) by  mouth  daily.   vitamin E 400 UNIT capsule Take 400 Units by mouth daily.   [DISCONTINUED] sertraline (ZOLOFT) 50 MG tablet 1/2 tablet q day x 1 week and then one po q day   No facility-administered encounter medications on file as of 11/14/2022.     Lab Results  Component Value Date   WBC 4.2 05/02/2022   HGB 13.2 05/02/2022   HCT 40.0 05/02/2022   PLT 247.0 05/02/2022   GLUCOSE 112 (H) 09/17/2022   CHOL 185 09/17/2022   TRIG 132.0 09/17/2022   HDL 52.50 09/17/2022   LDLDIRECT 126.0 05/02/2022   LDLCALC 106 (H) 09/17/2022   ALT 19 09/17/2022   AST 16 09/17/2022   NA 137 09/17/2022   K 4.7 09/17/2022   CL 100 09/17/2022   CREATININE 0.87 09/17/2022   BUN 20 09/17/2022   CO2 27 09/17/2022   TSH 3.31 09/17/2022   INR 1.0 07/13/2017   HGBA1C 6.1 09/17/2022   MICROALBUR <0.7 07/19/2021    MM 3D SCREEN BREAST BILATERAL  Result Date: 07/02/2022 CLINICAL DATA:  Screening. EXAM: DIGITAL SCREENING BILATERAL MAMMOGRAM WITH TOMOSYNTHESIS AND CAD TECHNIQUE: Bilateral screening digital craniocaudal and mediolateral oblique mammograms were obtained. Bilateral screening digital breast tomosynthesis was performed. The images were evaluated with computer-aided detection. COMPARISON:  Previous exam(s). ACR Breast Density Category b: There are scattered areas of fibroglandular density. FINDINGS: There are no findings suspicious for malignancy. IMPRESSION: No mammographic evidence of malignancy. A result letter of this screening mammogram will be mailed directly to the patient. RECOMMENDATION: Screening mammogram in one year. (Code:SM-B-01Y) BI-RADS CATEGORY  1: Negative. Electronically Signed   By: Everlean Alstrom M.D.   On: 07/02/2022 09:09       Assessment & Plan:  Colon cancer screening -     Cologuard  Essential (primary) hypertension Assessment & Plan: Continues on lisinopril. Spot check pressures.  Follow metabolic panel. Blood pressure as outlined.   Orders: -     Basic metabolic panel;  Future  Pure hypercholesterolemia Assessment & Plan: On lipitor.  Low cholesterol diet and exercise.  Follow lipid panel and liver function tests.    Orders: -     Hepatic function panel; Future -     Lipid panel; Future  Type 2 diabetes mellitus with hyperglycemia, without long-term current use of insulin ([place]) Assessment & Plan:  On metformin.  Continue low carb diet and exercise.  Follow met b and a1c.  Lab Results  Component Value Date   HGBA1C 6.1 09/17/2022     Orders: -     Hemoglobin A1c; Future -     Microalbumin / creatinine urine ratio; Future  Anemia, unspecified type Assessment & Plan: Follow cbc.    Leukopenia, unspecified type Assessment & Plan: Follow cbc.    Renal cyst Assessment & Plan: Saw urology (Dr Erlene Quan) 06/15/20.  Note reviewed.  Recommended f/u renal ultrasound in 2 years.  Had abdominal ultrasound 2022 - stable cyst.  Will need continued f/u with urology.    Stress Assessment & Plan: Increased stress and anxiety as outlined. On zoloft.  Doing better.  Feels better.  Follow closely.  Continue same zoloft dose.    Other orders -     Sertraline HCl; Take 1 tablet (50 mg total) by mouth daily.  Dispense: 90 tablet; Refill: 1     Einar Pheasant, MD

## 2022-11-15 ENCOUNTER — Encounter: Payer: Self-pay | Admitting: Internal Medicine

## 2022-11-15 NOTE — Assessment & Plan Note (Signed)
Follow cbc.  

## 2022-11-15 NOTE — Assessment & Plan Note (Signed)
On metformin.  Continue low carb diet and exercise.  Follow met b and a1c.  Lab Results  Component Value Date   HGBA1C 6.1 09/17/2022

## 2022-11-15 NOTE — Assessment & Plan Note (Signed)
Saw urology (Dr Erlene Quan) 06/15/20.  Note reviewed.  Recommended f/u renal ultrasound in 2 years.  Had abdominal ultrasound 2022 - stable cyst.  Will need continued f/u with urology.

## 2022-11-15 NOTE — Assessment & Plan Note (Signed)
On lipitor.  Low cholesterol diet and exercise.  Follow lipid panel and liver function tests.   

## 2022-11-15 NOTE — Assessment & Plan Note (Signed)
Continues on lisinopril. Spot check pressures.  Follow metabolic panel. Blood pressure as outlined.

## 2022-11-15 NOTE — Assessment & Plan Note (Signed)
Increased stress and anxiety as outlined. On zoloft.  Doing better.  Feels better.  Follow closely.  Continue same zoloft dose.

## 2022-11-22 ENCOUNTER — Other Ambulatory Visit: Payer: Self-pay | Admitting: Internal Medicine

## 2022-12-22 LAB — HM DIABETES EYE EXAM

## 2022-12-26 ENCOUNTER — Other Ambulatory Visit: Payer: Self-pay | Admitting: Internal Medicine

## 2023-01-02 ENCOUNTER — Encounter: Payer: Self-pay | Admitting: Internal Medicine

## 2023-02-04 ENCOUNTER — Other Ambulatory Visit (INDEPENDENT_AMBULATORY_CARE_PROVIDER_SITE_OTHER): Payer: BC Managed Care – PPO

## 2023-02-04 DIAGNOSIS — E1165 Type 2 diabetes mellitus with hyperglycemia: Secondary | ICD-10-CM | POA: Diagnosis not present

## 2023-02-04 DIAGNOSIS — E7800 Pure hypercholesterolemia, unspecified: Secondary | ICD-10-CM

## 2023-02-04 DIAGNOSIS — I10 Essential (primary) hypertension: Secondary | ICD-10-CM | POA: Diagnosis not present

## 2023-02-04 DIAGNOSIS — Z7984 Long term (current) use of oral hypoglycemic drugs: Secondary | ICD-10-CM | POA: Diagnosis not present

## 2023-02-04 LAB — HEPATIC FUNCTION PANEL
ALT: 18 U/L (ref 0–35)
AST: 20 U/L (ref 0–37)
Albumin: 4.3 g/dL (ref 3.5–5.2)
Alkaline Phosphatase: 56 U/L (ref 39–117)
Bilirubin, Direct: 0.1 mg/dL (ref 0.0–0.3)
Total Bilirubin: 0.5 mg/dL (ref 0.2–1.2)
Total Protein: 7 g/dL (ref 6.0–8.3)

## 2023-02-04 LAB — BASIC METABOLIC PANEL
BUN: 15 mg/dL (ref 6–23)
CO2: 29 mEq/L (ref 19–32)
Calcium: 9.5 mg/dL (ref 8.4–10.5)
Chloride: 98 mEq/L (ref 96–112)
Creatinine, Ser: 0.91 mg/dL (ref 0.40–1.20)
GFR: 67.75 mL/min (ref >60.00)
Glucose, Bld: 105 mg/dL — ABNORMAL HIGH (ref 70–99)
Potassium: 4.4 mEq/L (ref 3.5–5.1)
Sodium: 134 mEq/L — ABNORMAL LOW (ref 135–145)

## 2023-02-04 LAB — LIPID PANEL
Cholesterol: 194 mg/dL (ref 0–200)
HDL: 50.3 mg/dL (ref >39.00)
LDL Cholesterol: 112 mg/dL — ABNORMAL HIGH (ref 0–99)
NonHDL: 143.86
Total CHOL/HDL Ratio: 4
Triglycerides: 160 mg/dL — ABNORMAL HIGH (ref 0.0–149.0)
VLDL: 32 mg/dL (ref 0.0–40.0)

## 2023-02-04 LAB — MICROALBUMIN / CREATININE URINE RATIO
Creatinine,U: 21.5 mg/dL
Microalb Creat Ratio: 3.3 mg/g (ref 0.0–30.0)
Microalb, Ur: <0.7 mg/dL (ref 0.0–1.9)

## 2023-02-04 LAB — HEMOGLOBIN A1C: Hgb A1c MFr Bld: 5.8 % (ref 4.6–6.5)

## 2023-02-11 ENCOUNTER — Ambulatory Visit (INDEPENDENT_AMBULATORY_CARE_PROVIDER_SITE_OTHER): Payer: BC Managed Care – PPO | Admitting: Internal Medicine

## 2023-02-11 VITALS — BP 138/82 | HR 68 | Temp 98.1°F | Ht 63.0 in | Wt 195.0 lb

## 2023-02-11 DIAGNOSIS — N898 Other specified noninflammatory disorders of vagina: Secondary | ICD-10-CM

## 2023-02-11 DIAGNOSIS — E7800 Pure hypercholesterolemia, unspecified: Secondary | ICD-10-CM | POA: Diagnosis not present

## 2023-02-11 DIAGNOSIS — Z7984 Long term (current) use of oral hypoglycemic drugs: Secondary | ICD-10-CM

## 2023-02-11 DIAGNOSIS — E871 Hypo-osmolality and hyponatremia: Secondary | ICD-10-CM

## 2023-02-11 DIAGNOSIS — I10 Essential (primary) hypertension: Secondary | ICD-10-CM

## 2023-02-11 DIAGNOSIS — E1165 Type 2 diabetes mellitus with hyperglycemia: Secondary | ICD-10-CM | POA: Diagnosis not present

## 2023-02-11 DIAGNOSIS — F439 Reaction to severe stress, unspecified: Secondary | ICD-10-CM

## 2023-02-11 MED ORDER — ATORVASTATIN CALCIUM 10 MG PO TABS: 10.0000 mg | ORAL_TABLET | Freq: Every day | ORAL | 3 refills | Status: DC

## 2023-02-11 MED ORDER — DOXYCYCLINE HYCLATE 100 MG PO TABS
100.0000 mg | ORAL_TABLET | Freq: Two times a day (BID) | ORAL | 0 refills | Status: DC
Start: 2023-02-11 — End: 2023-04-08

## 2023-02-11 MED ORDER — SERTRALINE HCL 25 MG PO TABS: ORAL_TABLET | ORAL | 2 refills | Status: DC

## 2023-02-11 NOTE — Patient Instructions (Signed)
Take a probiotic daily while on the antibiotic and for two weeks after (eat actvia)

## 2023-02-11 NOTE — Progress Notes (Signed)
Subjective:    [name], female    DOB: 1961/07/01, 62 y.o.   MRN: [id number]  Patient here for  Chief Complaint  Patient presents with   Medical Management of Chronic Issues    12 week follow up     HPI Here to follow up regarding hypercholesterolemia, diabetes and hypertension.  Also increased stress and sleep issues. On zoloft.  Taking 1/4 tablet.  Sleeping better.  Work stress.  Discussed.  Discussed therapist.  No chest pain or sob reported.  No abdominal pain or bowel change reported.  Lesion - perivaginal area. Noticed yesterday.     Past Medical History:  Diagnosis Date   Abnormal liver function    Allergy    Anemia    GERD (gastroesophageal reflux disease)    Heart murmur    Hypercholesterolemia    Hyperglycemia    Hypertension    Past Surgical History:  Procedure Laterality Date   ABDOMINAL HYSTERECTOMY  2010   ACHILLES TENDON REPAIR  2009   BREAST BIOPSY Left 07/30/2006   benign   BREAST BIOPSY Left 02/18/2007 & 03/05/2007   benign   BREAST EXCISIONAL BIOPSY     BREAST SURGERY  2008   LUMBAR MICRODISCECTOMY  2004   ROTATOR CUFF REPAIR  2006   SEPTOPLASTY  1990   TONSILLECTOMY AND ADENOIDECTOMY  1969   Family History  Problem Relation Age of Onset   Hyperlipidemia Mother    Hypertension Mother    Stroke Mother    Heart disease Mother    Hypothyroidism Mother    Pernicious anemia Mother        aunt x 2   Hyperlipidemia Maternal Grandmother    Ulcers Maternal Grandmother    Lung cancer Father        Hx smoker   Hypertension Maternal Grandfather        MI   Heart disease Maternal Grandfather        myocardial infarction   Kidney cancer Paternal Grandmother    Ovarian cancer Maternal Aunt    Breast cancer Paternal Aunt    Diabetes Other    Hypothyroidism Other        aunt x 2   Colon cancer Neg Hx    Social History   Socioeconomic History   Marital status: Single    Spouse name: Not on file   Number of children: 0    Years of education: Not on file   Highest education level: Associate degree: occupational, Scientist, product/process development, or vocational program  Occupational History   Not on file  Tobacco Use   Smoking status: Never   Smokeless tobacco: Never  Vaping Use   Vaping Use: Never used  Substance and Sexual Activity   Alcohol use: Not Currently    Comment: rare   Drug use: No   Sexual activity: Not on file  Other Topics Concern   Not on file  Social History Narrative   She is single and has no children. 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    Social Determinants of Health   Financial Resource Strain: Low Risk  (02/11/2023)   Overall Financial Resource Strain (CARDIA)    Difficulty of Paying Living Expenses: Not hard at all  Food Insecurity: No Food Insecurity (02/11/2023)   Hunger Vital Sign    Worried About Running Out of Food in the Last Year: Never true    Ran Out of Food in the Last Year: Never true  Transportation Needs: No Transportation Needs (02/11/2023)   PRAPARE - Administrator, Civil Service (Medical): No    Lack of Transportation (Non-Medical): No  Physical Activity: Insufficiently Active (02/11/2023)   Exercise Vital Sign    Days of Exercise per Week: 1 day    Minutes of Exercise per Session: 40 min  Stress: No Stress Concern Present (02/11/2023)   [employer] of Occupational Health - Occupational Stress Questionnaire    Feeling of Stress : Only a little  Social Connections: Moderately Integrated (02/11/2023)   Social Connection and Isolation Panel [NHANES]    Frequency of Communication with Friends and Family: More than three times a week    Frequency of Social Gatherings with Friends and Family: Three times a week    Attends Religious Services: More than 4 times per year    Active Member of Clubs or Organizations: Yes    Attends Banker Meetings: More than 4 times per year    Marital Status: Never married     Review of  Systems  Constitutional:  Negative for appetite change and unexpected weight change.  HENT:  Negative for congestion and sinus pressure.   Respiratory:  Negative for cough, chest tightness and shortness of breath.   Cardiovascular:  Negative for chest pain and palpitations.  Gastrointestinal:  Negative for abdominal pain, diarrhea, nausea and vomiting.  Genitourinary:  Negative for difficulty urinating and dysuria.  Musculoskeletal:  Negative for joint swelling and myalgias.  Skin:  Negative for color change and rash.       Perivaginal lesion.   Neurological:  Negative for dizziness and headaches.  Psychiatric/Behavioral:  Negative for agitation and dysphoric mood.        Objective:     BP 138/82   Pulse 68   Temp 98.1 F (36.7 C) (Oral)   Ht 5\' 3"  (1.6 m)   Wt 195 lb (88.5 kg)   SpO2 99%   BMI 34.54 kg/m  Wt Readings from Last 3 Encounters:  02/11/23 195 lb (88.5 kg)  11/14/22 190 lb 6.4 oz (86.4 kg)  09/19/22 191 lb (86.6 kg)    Physical Exam Vitals reviewed.  Constitutional:      General: She is not in acute distress.    Appearance: Normal appearance.  HENT:     Head: Normocephalic and atraumatic.     Right Ear: External ear normal.     Left Ear: External ear normal.  Eyes:     General: No scleral icterus.       Right eye: No discharge.        Left eye: No discharge.     Conjunctiva/sclera: Conjunctivae normal.  Neck:     Thyroid: No thyromegaly.  Cardiovascular:     Rate and Rhythm: Normal rate and regular rhythm.  Pulmonary:     Effort: No respiratory distress.     Breath sounds: Normal breath sounds. No wheezing.  Abdominal:     General: Bowel sounds are normal.     Palpations: Abdomen is soft.     Tenderness: There is no abdominal tenderness.  Genitourinary:    Comments: Small raised perivaginal lesion- no surrounding erythema.  Musculoskeletal:        General: No swelling or tenderness.     Cervical back: Neck supple. No tenderness.   Lymphadenopathy:     Cervical: No cervical adenopathy.  Skin:    Findings: No erythema or rash.  Neurological:     Mental Status: She is alert.  Psychiatric:        Mood and Affect: Mood normal.        Behavior: Behavior normal.      Outpatient Encounter Medications as of 02/11/2023  Medication Sig   albuterol (PROVENTIL HFA;VENTOLIN HFA) 108 (90 Base) MCG/ACT inhaler Inhale 2 puffs into the lungs every 6 (six) hours as needed for wheezing or shortness of breath.   Biotin 1000 MCG CHEW Chew by mouth.   Cholecalciferol (VITAMIN D3) 1000 units CAPS Take 1 capsule by mouth daily.    Cyanocobalamin (B-12) 250 MCG TABS Take 250 mcg by mouth daily.   doxycycline (VIBRA-TABS) 100 MG tablet Take 1 tablet (100 mg total) by mouth 2 (two) times daily.   fluticasone (FLONASE) 50 MCG/ACT nasal spray Place 2 sprays into the nose as needed for rhinitis.   lisinopril (ZESTRIL) 20 MG tablet Take 1 tablet (20 mg total) by mouth daily.   loratadine (CLARITIN) 10 MG tablet Take 10 mg by mouth daily.   metFORMIN (GLUCOPHAGE-XR) 500 MG 24 hr tablet Take 1 tablet (500 mg total) by mouth 2 (two) times daily.   sertraline (ZOLOFT) 25 MG tablet 1/2 - 1 tablet q day   vitamin E 400 UNIT capsule Take 400 Units by mouth daily.   [DISCONTINUED] atorvastatin (LIPITOR) 10 MG tablet Take 1 tablet (10 mg total) by mouth daily.   [DISCONTINUED] sertraline (ZOLOFT) 50 MG tablet Take 1 tablet (50 mg total) by mouth daily.   atorvastatin (LIPITOR) 10 MG tablet Take 1 tablet (10 mg total) by mouth daily.   No facility-administered encounter medications on file as of 02/11/2023.     Lab Results  Component Value Date   WBC 4.2 05/02/2022   HGB 13.2 05/02/2022   HCT 40.0 05/02/2022   PLT 247.0 05/02/2022   GLUCOSE 105 (H) 02/04/2023   CHOL 194 02/04/2023   TRIG 160.0 (H) 02/04/2023   HDL 50.30 02/04/2023   LDLDIRECT 126.0 05/02/2022   LDLCALC 112 (H) 02/04/2023   ALT 18 02/04/2023   AST 20 02/04/2023   NA 134  (L) 02/04/2023   K 4.4 02/04/2023   CL 98 02/04/2023   CREATININE 0.91 02/04/2023   BUN 15 02/04/2023   CO2 29 02/04/2023   TSH 3.31 09/17/2022   INR 1.0 07/13/2017   HGBA1C 5.8 02/04/2023   MICROALBUR <0.7 02/04/2023    MM 3D SCREEN BREAST BILATERAL  Result Date: 07/02/2022 CLINICAL DATA:  Screening. EXAM: DIGITAL SCREENING BILATERAL MAMMOGRAM WITH TOMOSYNTHESIS AND CAD TECHNIQUE: Bilateral screening digital craniocaudal and mediolateral oblique mammograms were obtained. Bilateral screening digital breast tomosynthesis was performed. The images were evaluated with computer-aided detection. COMPARISON:  Previous exam(s). ACR Breast Density Category b: There are scattered areas of fibroglandular density. FINDINGS: There are no findings suspicious for malignancy. IMPRESSION: No mammographic evidence of malignancy. A result letter of this screening mammogram will be mailed directly to the patient. RECOMMENDATION: Screening mammogram in one year. (Code:SM-B-01Y) BI-RADS CATEGORY  1: Negative. Electronically Signed   By: Edwin Cap M.D.   On: 07/02/2022 09:09       Assessment & Plan:  Type 2 diabetes mellitus with hyperglycemia, without long-term current use of insulin (HCC) Assessment & Plan:  On metformin.  Continue low carb diet and exercise.  Follow met b and a1c.  Lab Results  Component Value Date   HGBA1C 5.8 02/04/2023     Orders: -     Hemoglobin A1c; Future -     Basic metabolic panel; Future -  Hepatic function panel; Future  Pure hypercholesterolemia Assessment & Plan: On lipitor.  Low cholesterol diet and exercise.  Follow lipid panel and liver function tests.    Orders: -     Hepatic function panel; Future -     Lipid panel; Future  Hyponatremia -     Sodium; Future  Stress Assessment & Plan: Increased stress and anxiety as outlined. Discussed zoloft 25mg  - 1/2 tablet per day.  Doing  some better.  Discussed therapy.    Essential (primary)  hypertension Assessment & Plan: Continues on lisinopril. Spot check pressures.  Follow metabolic panel. Blood pressure as outlined.    Vaginal lesion Assessment & Plan: Warm compresses.  Doxycycline as directed.  Follow.  Call with update.    Other orders -     Atorvastatin Calcium; Take 1 tablet (10 mg total) by mouth daily.  Dispense: 90 tablet; Refill: 3 -     Doxycycline Hyclate; Take 1 tablet (100 mg total) by mouth 2 (two) times daily.  Dispense: 14 tablet; Refill: 0 -     Sertraline HCl; 1/2 - 1 tablet q day  Dispense: 30 tablet; Refill: 2     Dale Acequia, MD

## 2023-02-13 ENCOUNTER — Encounter: Payer: Self-pay | Admitting: Internal Medicine

## 2023-02-15 ENCOUNTER — Encounter: Payer: Self-pay | Admitting: Internal Medicine

## 2023-02-15 DIAGNOSIS — N898 Other specified noninflammatory disorders of vagina: Secondary | ICD-10-CM | POA: Insufficient documentation

## 2023-02-15 NOTE — Assessment & Plan Note (Signed)
Continues on lisinopril. Spot check pressures.  Follow metabolic panel. Blood pressure as outlined.  

## 2023-02-15 NOTE — Assessment & Plan Note (Signed)
Warm compresses.  Doxycycline as directed.  Follow.  Call with update.

## 2023-02-15 NOTE — Assessment & Plan Note (Signed)
On lipitor.  Low cholesterol diet and exercise.  Follow lipid panel and liver function tests.   

## 2023-02-15 NOTE — Assessment & Plan Note (Signed)
Increased stress and anxiety as outlined. Discussed zoloft 25mg  - 1/2 tablet per day.  Doing  some better.  Discussed therapy.

## 2023-02-15 NOTE — Assessment & Plan Note (Signed)
On metformin.  Continue low carb diet and exercise.  Follow met b and a1c.  Lab Results  Component Value Date   HGBA1C 5.8 02/04/2023

## 2023-02-20 NOTE — Telephone Encounter (Signed)
If persistent, can refer to gyn to evaluate

## 2023-02-24 NOTE — Telephone Encounter (Signed)
Patient called back, Dr Roby Lofts question was ask. Her pimple is getting smaller. She will call back it get larger.

## 2023-02-24 NOTE — Telephone Encounter (Signed)
Noted  

## 2023-02-24 NOTE — Telephone Encounter (Signed)
LMTCB

## 2023-03-02 ENCOUNTER — Other Ambulatory Visit: Payer: Self-pay | Admitting: Internal Medicine

## 2023-03-11 ENCOUNTER — Other Ambulatory Visit (INDEPENDENT_AMBULATORY_CARE_PROVIDER_SITE_OTHER): Payer: BC Managed Care – PPO

## 2023-03-11 DIAGNOSIS — E871 Hypo-osmolality and hyponatremia: Secondary | ICD-10-CM

## 2023-03-11 DIAGNOSIS — E7800 Pure hypercholesterolemia, unspecified: Secondary | ICD-10-CM | POA: Diagnosis not present

## 2023-03-11 DIAGNOSIS — E1165 Type 2 diabetes mellitus with hyperglycemia: Secondary | ICD-10-CM

## 2023-03-11 LAB — HEMOGLOBIN A1C: Hgb A1c MFr Bld: 5.9 % (ref 4.6–6.5)

## 2023-03-11 LAB — HEPATIC FUNCTION PANEL
ALT: 25 U/L (ref 0–35)
AST: 24 U/L (ref 0–37)
Albumin: 4.4 g/dL (ref 3.5–5.2)
Alkaline Phosphatase: 53 U/L (ref 39–117)
Bilirubin, Direct: 0.1 mg/dL (ref 0.0–0.3)
Total Bilirubin: 0.6 mg/dL (ref 0.2–1.2)
Total Protein: 7.2 g/dL (ref 6.0–8.3)

## 2023-03-11 LAB — BASIC METABOLIC PANEL
BUN: 21 mg/dL (ref 6–23)
CO2: 30 mEq/L (ref 19–32)
Calcium: 10.1 mg/dL (ref 8.4–10.5)
Chloride: 94 mEq/L — ABNORMAL LOW (ref 96–112)
Creatinine, Ser: 0.91 mg/dL (ref 0.40–1.20)
GFR: 67.7 mL/min (ref >60.00)
Glucose, Bld: 93 mg/dL (ref 70–99)
Potassium: 4.6 mEq/L (ref 3.5–5.1)
Sodium: 131 mEq/L — ABNORMAL LOW (ref 135–145)

## 2023-03-11 LAB — LIPID PANEL
Cholesterol: 201 mg/dL — ABNORMAL HIGH (ref 0–200)
HDL: 54.8 mg/dL (ref >39.00)
LDL Cholesterol: 110 mg/dL — ABNORMAL HIGH (ref 0–99)
NonHDL: 146.4
Total CHOL/HDL Ratio: 4
Triglycerides: 182 mg/dL — ABNORMAL HIGH (ref 0.0–149.0)
VLDL: 36.4 mg/dL (ref 0.0–40.0)

## 2023-03-11 LAB — SODIUM: Sodium: 131 mEq/L — ABNORMAL LOW (ref 135–145)

## 2023-03-13 ENCOUNTER — Other Ambulatory Visit: Payer: Self-pay

## 2023-03-13 DIAGNOSIS — E871 Hypo-osmolality and hyponatremia: Secondary | ICD-10-CM

## 2023-03-26 ENCOUNTER — Other Ambulatory Visit (INDEPENDENT_AMBULATORY_CARE_PROVIDER_SITE_OTHER): Payer: BC Managed Care – PPO

## 2023-03-26 DIAGNOSIS — E871 Hypo-osmolality and hyponatremia: Secondary | ICD-10-CM

## 2023-03-26 LAB — SODIUM: Sodium: 136 mEq/L (ref 135–145)

## 2023-03-28 LAB — COLOGUARD: COLOGUARD: NEGATIVE

## 2023-04-08 ENCOUNTER — Encounter: Payer: Self-pay | Admitting: Internal Medicine

## 2023-04-08 ENCOUNTER — Ambulatory Visit: Payer: BC Managed Care – PPO | Admitting: Internal Medicine

## 2023-04-08 VITALS — BP 118/70 | HR 80 | Temp 97.9°F | Resp 16 | Ht 63.0 in | Wt 194.0 lb

## 2023-04-08 DIAGNOSIS — F439 Reaction to severe stress, unspecified: Secondary | ICD-10-CM | POA: Diagnosis not present

## 2023-04-08 DIAGNOSIS — E1165 Type 2 diabetes mellitus with hyperglycemia: Secondary | ICD-10-CM

## 2023-04-08 DIAGNOSIS — N281 Cyst of kidney, acquired: Secondary | ICD-10-CM

## 2023-04-08 DIAGNOSIS — E7800 Pure hypercholesterolemia, unspecified: Secondary | ICD-10-CM | POA: Diagnosis not present

## 2023-04-08 DIAGNOSIS — I10 Essential (primary) hypertension: Secondary | ICD-10-CM

## 2023-04-08 DIAGNOSIS — M179 Osteoarthritis of knee, unspecified: Secondary | ICD-10-CM

## 2023-04-08 DIAGNOSIS — Z7984 Long term (current) use of oral hypoglycemic drugs: Secondary | ICD-10-CM

## 2023-04-08 NOTE — Progress Notes (Signed)
Subjective:    [name], female    DOB: 01/18/1961, 62 y.o.   MRN: [id number]  Patient here for  Chief Complaint  Patient presents with   Medical Management of Chronic Issues    Follow up on zoloft. Discuss ADHD testing.    HPI Here to follow up regarding hypercholesterolemia, diabetes and hypertension. Also increased stress and sleep issues. Zoloft 25mg  1/2 tablet.  She is falling asleep easier with the zoloft.  Does not feel "groggy" during the day.  Is concerned regarding possible ADHD.  Seeing a therapist.  Has noticed problems with focus and concentration.  Can be forgetful.  Making mistakes at work.  Would like formal testing. Seeing [facility] - s/p orthovisc injections - right knee OA.  Also is s/p injection right middle and left thumb - trigger finger.    Past Medical History:  Diagnosis Date   Abnormal liver function    Allergy    Anemia    GERD (gastroesophageal reflux disease)    Heart murmur    Hypercholesterolemia    Hyperglycemia    Hypertension    Past Surgical History:  Procedure Laterality Date   ABDOMINAL HYSTERECTOMY  2010   ACHILLES TENDON REPAIR  2009   BREAST BIOPSY Left 07/30/2006   benign   BREAST BIOPSY Left 02/18/2007 & 03/05/2007   benign   BREAST EXCISIONAL BIOPSY     BREAST SURGERY  2008   LUMBAR MICRODISCECTOMY  2004   ROTATOR CUFF REPAIR  2006   SEPTOPLASTY  1990   TONSILLECTOMY AND ADENOIDECTOMY  1969   Family History  Problem Relation Age of Onset   Hyperlipidemia Mother    Hypertension Mother    Stroke Mother    Heart disease Mother    Hypothyroidism Mother    Pernicious anemia Mother        aunt x 2   Hyperlipidemia Maternal Grandmother    Ulcers Maternal Grandmother    Lung cancer Father        Hx smoker   Hypertension Maternal Grandfather        MI   Heart disease Maternal Grandfather        myocardial infarction   Kidney cancer Paternal Grandmother    Ovarian cancer Maternal Aunt    Breast cancer  Paternal Aunt    Diabetes Other    Hypothyroidism Other        aunt x 2   Colon cancer Neg Hx    Social History   Socioeconomic History   Marital status: Single    Spouse name: Not on file   Number of children: 0   Years of education: Not on file   Highest education level: Associate degree: occupational, Scientist, product/process development, or vocational program  Occupational History   Not on file  Tobacco Use   Smoking status: Never   Smokeless tobacco: Never  Vaping Use   Vaping status: Never Used  Substance and Sexual Activity   Alcohol use: Not Currently    Comment: rare   Drug use: No   Sexual activity: Not on file  Other Topics Concern   Not on file  Social History Narrative   She is single and has no children. 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    Social Determinants of Health   Financial Resource Strain: Low Risk  (02/11/2023)   Overall Financial Resource Strain (CARDIA)    Difficulty of Paying Living Expenses: Not hard at all  Food  Insecurity: No Food Insecurity (02/11/2023)   Hunger Vital Sign    Worried About Running Out of Food in the Last Year: Never true    Ran Out of Food in the Last Year: Never true  Transportation Needs: No Transportation Needs (02/11/2023)   PRAPARE - Administrator, Civil Service (Medical): No    Lack of Transportation (Non-Medical): No  Physical Activity: Insufficiently Active (02/11/2023)   Exercise Vital Sign    Days of Exercise per Week: 1 day    Minutes of Exercise per Session: 40 min  Stress: No Stress Concern Present (02/11/2023)   [employer] of Occupational Health - Occupational Stress Questionnaire    Feeling of Stress : Only a little  Social Connections: Moderately Integrated (02/11/2023)   Social Connection and Isolation Panel [NHANES]    Frequency of Communication with Friends and Family: More than three times a week    Frequency of Social Gatherings with Friends and Family: Three times a week     Attends Religious Services: More than 4 times per year    Active Member of Clubs or Organizations: Yes    Attends Banker Meetings: More than 4 times per year    Marital Status: Never married     Review of Systems  Constitutional:  Negative for appetite change and unexpected weight change.  HENT:  Negative for congestion and sinus pressure.   Respiratory:  Negative for cough, chest tightness and shortness of breath.   Cardiovascular:  Negative for chest pain, palpitations and leg swelling.  Gastrointestinal:  Negative for abdominal pain, diarrhea, nausea and vomiting.  Genitourinary:  Negative for difficulty urinating and dysuria.  Musculoskeletal:  Negative for joint swelling and myalgias.  Skin:  Negative for color change and rash.  Neurological:  Negative for dizziness and headaches.  Psychiatric/Behavioral:  Negative for agitation and dysphoric mood.        Objective:     BP 118/70   Pulse 80   Temp 97.9 F (36.6 C)   Resp 16   Ht 5\' 3"  (1.6 m)   Wt 194 lb (88 kg)   SpO2 98%   BMI 34.37 kg/m  Wt Readings from Last 3 Encounters:  04/08/23 194 lb (88 kg)  02/11/23 195 lb (88.5 kg)  11/14/22 190 lb 6.4 oz (86.4 kg)    Physical Exam Vitals reviewed.  Constitutional:      General: She is not in acute distress.    Appearance: Normal appearance.  HENT:     Head: Normocephalic and atraumatic.     Right Ear: External ear normal.     Left Ear: External ear normal.  Eyes:     General: No scleral icterus.       Right eye: No discharge.        Left eye: No discharge.     Conjunctiva/sclera: Conjunctivae normal.  Neck:     Thyroid: No thyromegaly.  Cardiovascular:     Rate and Rhythm: Normal rate and regular rhythm.  Pulmonary:     Effort: No respiratory distress.     Breath sounds: Normal breath sounds. No wheezing.  Abdominal:     General: Bowel sounds are normal.     Palpations: Abdomen is soft.     Tenderness: There is no abdominal tenderness.   Musculoskeletal:        General: No swelling or tenderness.     Cervical back: Neck supple. No tenderness.  Lymphadenopathy:     Cervical: No cervical  adenopathy.  Skin:    Findings: No erythema or rash.  Neurological:     Mental Status: She is alert.  Psychiatric:        Mood and Affect: Mood normal.        Behavior: Behavior normal.      Outpatient Encounter Medications as of 04/08/2023  Medication Sig   albuterol (PROVENTIL HFA;VENTOLIN HFA) 108 (90 Base) MCG/ACT inhaler Inhale 2 puffs into the lungs every 6 (six) hours as needed for wheezing or shortness of breath.   atorvastatin (LIPITOR) 10 MG tablet Take 1 tablet (10 mg total) by mouth daily.   Biotin 1000 MCG CHEW Chew by mouth.   Cholecalciferol (VITAMIN D3) 1000 units CAPS Take 1 capsule by mouth daily.    Cyanocobalamin (B-12) 250 MCG TABS Take 250 mcg by mouth daily.   fluticasone (FLONASE) 50 MCG/ACT nasal spray Place 2 sprays into the nose as needed for rhinitis.   lisinopril (ZESTRIL) 20 MG tablet Take 1 tablet (20 mg total) by mouth daily.   loratadine (CLARITIN) 10 MG tablet Take 10 mg by mouth daily.   metFORMIN (GLUCOPHAGE-XR) 500 MG 24 hr tablet Take 1 tablet (500 mg total) by mouth 2 (two) times daily.   sertraline (ZOLOFT) 25 MG tablet 1/2 - 1 tablet q day   vitamin E 400 UNIT capsule Take 400 Units by mouth daily.   [DISCONTINUED] doxycycline (VIBRA-TABS) 100 MG tablet Take 1 tablet (100 mg total) by mouth 2 (two) times daily.   No facility-administered encounter medications on file as of 04/08/2023.     Lab Results  Component Value Date   WBC 4.2 05/02/2022   HGB 13.2 05/02/2022   HCT 40.0 05/02/2022   PLT 247.0 05/02/2022   GLUCOSE 93 03/11/2023   CHOL 201 (H) 03/11/2023   TRIG 182.0 (H) 03/11/2023   HDL 54.80 03/11/2023   LDLDIRECT 126.0 05/02/2022   LDLCALC 110 (H) 03/11/2023   ALT 25 03/11/2023   AST 24 03/11/2023   NA 136 03/26/2023   K 4.6 03/11/2023   CL 94 (L) 03/11/2023   CREATININE  0.91 03/11/2023   BUN 21 03/11/2023   CO2 30 03/11/2023   TSH 3.31 09/17/2022   INR 1.0 07/13/2017   HGBA1C 5.9 03/11/2023   MICROALBUR <0.7 02/04/2023    MM 3D SCREEN BREAST BILATERAL  Result Date: 07/02/2022 CLINICAL DATA:  Screening. EXAM: DIGITAL SCREENING BILATERAL MAMMOGRAM WITH TOMOSYNTHESIS AND CAD TECHNIQUE: Bilateral screening digital craniocaudal and mediolateral oblique mammograms were obtained. Bilateral screening digital breast tomosynthesis was performed. The images were evaluated with computer-aided detection. COMPARISON:  Previous exam(s). ACR Breast Density Category b: There are scattered areas of fibroglandular density. FINDINGS: There are no findings suspicious for malignancy. IMPRESSION: No mammographic evidence of malignancy. A result letter of this screening mammogram will be mailed directly to the patient. RECOMMENDATION: Screening mammogram in one year. (Code:SM-B-01Y) BI-RADS CATEGORY  1: Negative. Electronically Signed   By: Edwin Cap M.D.   On: 07/02/2022 09:09       Assessment & Plan:  Stress Assessment & Plan: Increased stress and anxiety as outlined. Discussed zoloft 25mg  - 1/2 tablet per day.  Doing  some better.  Going to therapy.  Request ADHD testing.  Follow.   Orders: -     Ambulatory referral to Psychiatry  Essential (primary) hypertension Assessment & Plan: Continues on lisinopril. Spot check pressures.  Follow metabolic panel. Blood pressure as outlined.    Osteoarthritis of knee, unspecified laterality, unspecified osteoarthritis type Assessment &  Plan:  Seeing [facility] - s/p orthovisc injections - right knee OA.     Pure hypercholesterolemia Assessment & Plan: On lipitor.  Low cholesterol diet and exercise.  Follow lipid panel and liver function tests.     Renal cyst Assessment & Plan: Saw urology (Dr Apolinar Junes) 06/15/20.  Note reviewed.  Recommended f/u renal ultrasound in 2 years.  Had abdominal ultrasound 2022 - stable  cyst.  Will need continued f/u with urology.    Type 2 diabetes mellitus with hyperglycemia, without long-term current use of insulin (HCC) Assessment & Plan:  On metformin.  Continue low carb diet and exercise.  Follow met b and a1c.  Lab Results  Component Value Date   HGBA1C 5.9 03/11/2023         Dale [place], MD

## 2023-04-08 NOTE — Telephone Encounter (Signed)
Order placed for psych referral.   

## 2023-04-12 ENCOUNTER — Encounter: Payer: Self-pay | Admitting: Internal Medicine

## 2023-04-12 NOTE — Assessment & Plan Note (Signed)
On lipitor.  Low cholesterol diet and exercise.  Follow lipid panel and liver function tests.   

## 2023-04-12 NOTE — Assessment & Plan Note (Signed)
Increased stress and anxiety as outlined. Discussed zoloft 25mg  - 1/2 tablet per day.  Doing  some better.  Going to therapy.  Request ADHD testing.  Follow.

## 2023-04-12 NOTE — Assessment & Plan Note (Signed)
Continues on lisinopril. Spot check pressures.  Follow metabolic panel. Blood pressure as outlined.

## 2023-04-12 NOTE — Assessment & Plan Note (Signed)
Seeing [facility] - s/p orthovisc injections - right knee OA.

## 2023-04-12 NOTE — Assessment & Plan Note (Signed)
On metformin.  Continue low carb diet and exercise.  Follow met b and a1c.  Lab Results  Component Value Date   HGBA1C 5.9 03/11/2023

## 2023-04-12 NOTE — Assessment & Plan Note (Signed)
Saw urology (Dr Brandon) 06/15/20.  Note reviewed.  Recommended f/u renal ultrasound in 2 years.  Had abdominal ultrasound 2022 - stable cyst.  Will need continued f/u with urology.  

## 2023-04-20 NOTE — Telephone Encounter (Signed)
Called [facility]. Unable to LM.

## 2023-04-30 ENCOUNTER — Encounter (INDEPENDENT_AMBULATORY_CARE_PROVIDER_SITE_OTHER): Payer: Self-pay

## 2023-05-10 ENCOUNTER — Encounter: Payer: Self-pay | Admitting: Internal Medicine

## 2023-05-10 DIAGNOSIS — M6530 Trigger finger, unspecified finger: Secondary | ICD-10-CM | POA: Insufficient documentation

## 2023-05-13 ENCOUNTER — Telehealth: Payer: Self-pay

## 2023-05-13 NOTE — Telephone Encounter (Signed)
Spoke with [facility] [employer] [facility] [facility]. Faxed electronic record request.

## 2023-05-13 NOTE — Telephone Encounter (Signed)
ELECTRONIC RECORD REQUEST SENT TO [facility].

## 2023-05-27 ENCOUNTER — Other Ambulatory Visit: Payer: Self-pay | Admitting: Internal Medicine

## 2023-05-27 DIAGNOSIS — Z1231 Encounter for screening mammogram for malignant neoplasm of breast: Secondary | ICD-10-CM

## 2023-05-28 ENCOUNTER — Telehealth: Payer: Self-pay | Admitting: Internal Medicine

## 2023-05-28 DIAGNOSIS — E1165 Type 2 diabetes mellitus with hyperglycemia: Secondary | ICD-10-CM

## 2023-05-28 DIAGNOSIS — E7800 Pure hypercholesterolemia, unspecified: Secondary | ICD-10-CM

## 2023-05-28 NOTE — Telephone Encounter (Signed)
Patient need lab orders.

## 2023-05-28 NOTE — Addendum Note (Signed)
Addended by: Rita Ohara D on: 05/28/2023 03:37 PM   Modules accepted: Orders

## 2023-05-28 NOTE — Telephone Encounter (Signed)
Future labs ordered.  

## 2023-06-05 ENCOUNTER — Other Ambulatory Visit (INDEPENDENT_AMBULATORY_CARE_PROVIDER_SITE_OTHER): Payer: BC Managed Care – PPO

## 2023-06-05 DIAGNOSIS — E1165 Type 2 diabetes mellitus with hyperglycemia: Secondary | ICD-10-CM

## 2023-06-05 DIAGNOSIS — E7800 Pure hypercholesterolemia, unspecified: Secondary | ICD-10-CM

## 2023-06-05 LAB — BASIC METABOLIC PANEL
BUN: 22 mg/dL (ref 6–23)
CO2: 27 mEq/L (ref 19–32)
Calcium: 9.8 mg/dL (ref 8.4–10.5)
Chloride: 99 mEq/L (ref 96–112)
Creatinine, Ser: 0.94 mg/dL (ref 0.40–1.20)
GFR: 65.01 mL/min (ref >60.00)
Glucose, Bld: 112 mg/dL — ABNORMAL HIGH (ref 70–99)
Potassium: 4.7 mEq/L (ref 3.5–5.1)
Sodium: 134 mEq/L — ABNORMAL LOW (ref 135–145)

## 2023-06-05 LAB — HEPATIC FUNCTION PANEL
ALT: 18 U/L (ref 0–35)
AST: 14 U/L (ref 0–37)
Albumin: 4.2 g/dL (ref 3.5–5.2)
Alkaline Phosphatase: 64 U/L (ref 39–117)
Bilirubin, Direct: 0.1 mg/dL (ref 0.0–0.3)
Total Bilirubin: 0.4 mg/dL (ref 0.2–1.2)
Total Protein: 6.7 g/dL (ref 6.0–8.3)

## 2023-06-05 LAB — LIPID PANEL
Cholesterol: 164 mg/dL (ref 0–200)
HDL: 51 mg/dL (ref >39.00)
LDL Cholesterol: 82 mg/dL (ref 0–99)
NonHDL: 113.09
Total CHOL/HDL Ratio: 3
Triglycerides: 157 mg/dL — ABNORMAL HIGH (ref 0.0–149.0)
VLDL: 31.4 mg/dL (ref 0.0–40.0)

## 2023-06-05 LAB — HEMOGLOBIN A1C: Hgb A1c MFr Bld: 6 % (ref 4.6–6.5)

## 2023-06-10 ENCOUNTER — Ambulatory Visit: Payer: BC Managed Care – PPO | Admitting: Internal Medicine

## 2023-06-10 VITALS — BP 112/70 | HR 86 | Temp 97.9°F | Resp 16 | Ht 63.0 in | Wt 191.0 lb

## 2023-06-10 DIAGNOSIS — E871 Hypo-osmolality and hyponatremia: Secondary | ICD-10-CM | POA: Diagnosis not present

## 2023-06-10 DIAGNOSIS — Z23 Encounter for immunization: Secondary | ICD-10-CM

## 2023-06-10 DIAGNOSIS — M179 Osteoarthritis of knee, unspecified: Secondary | ICD-10-CM

## 2023-06-10 DIAGNOSIS — D649 Anemia, unspecified: Secondary | ICD-10-CM | POA: Diagnosis not present

## 2023-06-10 DIAGNOSIS — Z0000 Encounter for general adult medical examination without abnormal findings: Secondary | ICD-10-CM

## 2023-06-10 DIAGNOSIS — F439 Reaction to severe stress, unspecified: Secondary | ICD-10-CM

## 2023-06-10 DIAGNOSIS — E7800 Pure hypercholesterolemia, unspecified: Secondary | ICD-10-CM

## 2023-06-10 DIAGNOSIS — I10 Essential (primary) hypertension: Secondary | ICD-10-CM

## 2023-06-10 DIAGNOSIS — N281 Cyst of kidney, acquired: Secondary | ICD-10-CM | POA: Diagnosis not present

## 2023-06-10 DIAGNOSIS — E1165 Type 2 diabetes mellitus with hyperglycemia: Secondary | ICD-10-CM

## 2023-06-10 DIAGNOSIS — M6530 Trigger finger, unspecified finger: Secondary | ICD-10-CM

## 2023-06-10 MED ORDER — LISINOPRIL 20 MG PO TABS: 20.0000 mg | ORAL_TABLET | Freq: Every day | ORAL | 3 refills | Status: DC

## 2023-06-10 MED ORDER — SERTRALINE HCL 25 MG PO TABS: ORAL_TABLET | ORAL | 1 refills | Status: DC

## 2023-06-10 MED ORDER — METFORMIN HCL ER 500 MG PO TB24: 500.0000 mg | ORAL_TABLET | Freq: Two times a day (BID) | ORAL | 3 refills | Status: DC

## 2023-06-10 NOTE — Assessment & Plan Note (Signed)
Physical today 06/10/23.  Mammogram 06/30/22 - Briads I.  cologuard 04/12/20 - negative.

## 2023-06-10 NOTE — Assessment & Plan Note (Addendum)
Saw urology (Dr Apolinar Junes) 06/15/20.  Note reviewed.  Recommended f/u renal ultrasound in 2 years.  Had abdominal ultrasound 2022 - stable cyst.  Discussed f/u ultrasound.  Agreeable.

## 2023-06-14 ENCOUNTER — Encounter: Payer: Self-pay | Admitting: Internal Medicine

## 2023-06-14 NOTE — Assessment & Plan Note (Signed)
Saw [facility]. s/p injection right middle and left thumb - trigger finger. Right is better.  Persistent issues with left thumb.  Due to f/u this week.

## 2023-06-14 NOTE — Assessment & Plan Note (Signed)
Seeing [facility] - s/p orthovisc injections - right knee OA.

## 2023-06-14 NOTE — Assessment & Plan Note (Addendum)
Going to therapy.  S/p ADHD testing. On adderall.  Helping.  Continue zoloft.

## 2023-06-14 NOTE — Assessment & Plan Note (Signed)
On metformin.  Continue low carb diet and exercise.  Follow met b and a1c.  Lab Results  Component Value Date   HGBA1C 6.0 06/05/2023

## 2023-06-14 NOTE — Assessment & Plan Note (Signed)
Follow cbc.  

## 2023-06-14 NOTE — Assessment & Plan Note (Signed)
Continues on lisinopril. Spot check pressures.  Follow metabolic panel. Blood pressure as outlined.

## 2023-06-14 NOTE — Assessment & Plan Note (Signed)
On lipitor.  Low cholesterol diet and exercise.  Follow lipid panel and liver function tests.   

## 2023-06-22 ENCOUNTER — Ambulatory Visit
Admission: RE | Admit: 2023-06-22 | Discharge: 2023-06-22 | Disposition: A | Discharge status: Home / Self Care | Payer: BC Managed Care – PPO | Source: Ambulatory Visit | Attending: Internal Medicine | Admitting: Internal Medicine

## 2023-06-22 DIAGNOSIS — N281 Cyst of kidney, acquired: Secondary | ICD-10-CM | POA: Diagnosis present

## 2023-07-08 ENCOUNTER — Other Ambulatory Visit: Payer: BC Managed Care – PPO

## 2023-07-08 ENCOUNTER — Ambulatory Visit
Admission: RE | Admit: 2023-07-08 | Discharge: 2023-07-08 | Disposition: A | Discharge status: Home / Self Care | Payer: BC Managed Care – PPO | Source: Ambulatory Visit | Attending: Emergency Medicine | Admitting: Emergency Medicine

## 2023-07-08 VITALS — BP 119/81 | HR 88 | Temp 98.7°F | Resp 18

## 2023-07-08 DIAGNOSIS — J0100 Acute maxillary sinusitis, unspecified: Secondary | ICD-10-CM

## 2023-07-08 MED ORDER — SULFAMETHOXAZOLE-TRIMETHOPRIM 800-160 MG PO TABS
1.0000 | ORAL_TABLET | Freq: Two times a day (BID) | ORAL | 0 refills | Status: AC
Start: 2023-07-08 — End: 2023-07-15

## 2023-07-08 NOTE — Discharge Instructions (Addendum)
Continue symptomatic treatment.  If you are not improving in the next 2 to 3 days, start the Bactrim as directed.  Follow-up with your primary care provider if your symptoms are not improving.

## 2023-07-08 NOTE — ED Triage Notes (Signed)
Patient to Urgent Care with complaints of sinus pain and pressure. Nasal congestion. Reports light green mucus production/ drainage and fevers. Max temp 100.4  Symptoms started 4 days ago.  Taking Mucinex DM.

## 2023-07-08 NOTE — ED Provider Notes (Signed)
Renaldo Fiddler    CSN: [id number] Arrival date & time: 07/08/23  4098      History   Chief Complaint Chief Complaint  Patient presents with   Facial Pain    Started with a scratchy throat, then moved to slight cough, light green drainage, fever-up to 100.4.  Have been taking Mucinex DM. - Entered by patient    HPI Carly Fields is a 62 y.o. female.  Patient presents with 4-day history of sinus congestion, postnasal drip, runny nose, sinus pressure, cough.  She reports low-grade temp of 100.4.  She has been taking Mucinex DM.  She denies shortness of breath, chest pain, or other symptoms.  Her medical history includes renal cyst, abnormal liver function, hypertension, hyperlipidemia, diabetes, obesity.  The history is provided by the patient and medical records.    Past Medical History:  Diagnosis Date   Abnormal liver function    Allergy    Anemia    GERD (gastroesophageal reflux disease)    Heart murmur    Hypercholesterolemia    Hyperglycemia    Hypertension     Patient Active Problem List   Diagnosis Date Noted   Trigger finger 05/10/2023   Vaginal lesion 02/15/2023   Vaginal bleeding 02/21/2022   Leukopenia 07/01/2020   Vitamin D deficiency 06/21/2020   Stress 05/13/2020   Osteoarthritis of knee 12/02/2019   Right knee pain 11/26/2019   Renal cyst 09/17/2016   Anal lesion 05/13/2015   Health care maintenance 01/14/2015   Obesity 09/03/2014   Breast hematoma 08/06/2014   Psoriasis 11/24/2012   Anemia 08/20/2012   Type 2 diabetes mellitus with hyperglycemia (HCC) 06/21/2012   Other specified diseases of liver 06/21/2012   Pure hypercholesterolemia 06/21/2012   Essential (primary) hypertension 06/20/2012    Past Surgical History:  Procedure Laterality Date   ABDOMINAL HYSTERECTOMY  2010   ACHILLES TENDON REPAIR  2009   BREAST BIOPSY Left 07/30/2006   benign   BREAST BIOPSY Left 02/18/2007 & 03/05/2007   benign   BREAST EXCISIONAL BIOPSY      BREAST SURGERY  2008   LUMBAR MICRODISCECTOMY  2004   ROTATOR CUFF REPAIR  2006   SEPTOPLASTY  1990   TONSILLECTOMY AND ADENOIDECTOMY  1969    OB History   No obstetric history on file.      Home Medications    Prior to Admission medications   Medication Sig Start Date End Date Taking? Authorizing Provider  sulfamethoxazole-trimethoprim (BACTRIM DS) 800-160 MG tablet Take 1 tablet by mouth 2 (two) times daily for 7 days. 07/08/23 07/15/23 Yes Mickie Bail, NP  albuterol (PROVENTIL HFA;VENTOLIN HFA) 108 (90 Base) MCG/ACT inhaler Inhale 2 puffs into the lungs every 6 (six) hours as needed for wheezing or shortness of breath. 02/11/18   Dale [place], MD  amphetamine-dextroamphetamine (ADDERALL XR) 10 MG 24 hr capsule Take 10 mg by mouth every morning. 05/29/23   [provider]  atorvastatin (LIPITOR) 10 MG tablet Take 1 tablet (10 mg total) by mouth daily. 02/11/23   Dale [place], MD  Biotin 1000 MCG CHEW Chew by mouth.    [provider]  Cholecalciferol (VITAMIN D3) 1000 units CAPS Take 1 capsule by mouth daily.  07/16/17   [provider]  Cyanocobalamin (B-12) 250 MCG TABS Take 250 mcg by mouth daily.    [provider]  fluticasone (FLONASE) 50 MCG/ACT nasal spray Place 2 sprays into the nose as needed for rhinitis.    [provider]  lisinopril (ZESTRIL) 20 MG tablet Take 1 tablet (20 mg total) by mouth daily. 06/10/23   Dale [place], MD  loratadine (CLARITIN) 10 MG tablet Take 10 mg by mouth daily.    [provider]  metFORMIN (GLUCOPHAGE-XR) 500 MG 24 hr tablet Take 1 tablet (500 mg total) by mouth 2 (two) times daily. 06/10/23   Dale [place], MD  sertraline (ZOLOFT) 25 MG tablet 1/2 - 1 tablet q day 06/10/23   Dale [place], MD  vitamin E 400 UNIT capsule Take 400 Units by mouth daily.    [provider]    Family History Family History  Problem Relation Age of Onset   Hyperlipidemia Mother     Hypertension Mother    Stroke Mother    Heart disease Mother    Hypothyroidism Mother    Pernicious anemia Mother        aunt x 2   Hyperlipidemia Maternal Grandmother    Ulcers Maternal Grandmother    Lung cancer Father        Hx smoker   Hypertension Maternal Grandfather        MI   Heart disease Maternal Grandfather        myocardial infarction   Kidney cancer Paternal Grandmother    Ovarian cancer Maternal Aunt    Breast cancer Paternal Aunt    Diabetes Other    Hypothyroidism Other        aunt x 2   Colon cancer Neg Hx     Social History Social History   Tobacco Use   Smoking status: Never   Smokeless tobacco: Never  Vaping Use   Vaping status: Never Used  Substance Use Topics   Alcohol use: Not Currently    Comment: rare   Drug use: No     Allergies   Penicillins and Tetanus toxoids   Review of Systems Review of Systems  Constitutional:  Positive for fever. Negative for chills.  HENT:  Positive for congestion, postnasal drip, rhinorrhea and sinus pressure. Negative for ear pain and sore throat.   Respiratory:  Positive for cough. Negative for shortness of breath.   Cardiovascular:  Negative for chest pain and palpitations.  Gastrointestinal:  Negative for diarrhea and vomiting.     Physical Exam Triage Vital Signs ED Triage Vitals  Encounter Vitals Group     BP 07/08/23 0957 119/81     Systolic BP Percentile --      Diastolic BP Percentile --      Pulse Rate 07/08/23 0953 88     Resp 07/08/23 0953 18     Temp 07/08/23 0953 98.7 F (37.1 C)     Temp src --      SpO2 07/08/23 0953 96 %     Weight --      Height --      Head Circumference --      Peak Flow --      Pain Score 07/08/23 0952 3     Pain Loc --      Pain Education --      Exclude from Growth Chart --    No data found.  Updated Vital Signs BP 119/81   Pulse 88   Temp 98.7 F (37.1 C)   Resp 18   SpO2 96%   Visual Acuity Right Eye Distance:   Left Eye Distance:    Bilateral Distance:    Right Eye Near:   Left Eye Near:    Bilateral Near:  Physical Exam Vitals and nursing note reviewed.  Constitutional:      General: She is not in acute distress.    Appearance: She is well-developed.  HENT:     Right Ear: Tympanic membrane normal.     Left Ear: Tympanic membrane normal.     Nose: Congestion and rhinorrhea present.     Mouth/Throat:     Mouth: Mucous membranes are moist.     Pharynx: Oropharynx is clear.  Eyes:     Conjunctiva/sclera: Conjunctivae normal.  Cardiovascular:     Rate and Rhythm: Normal rate and regular rhythm.     Heart sounds: Normal heart sounds.  Pulmonary:     Effort: Pulmonary effort is normal. No respiratory distress.     Breath sounds: Normal breath sounds.  Musculoskeletal:     Cervical back: Neck supple.  Skin:    General: Skin is warm and dry.  Neurological:     Mental Status: She is alert.      UC Treatments / Results  Labs (all labs ordered are listed, but only abnormal results are displayed) Labs Reviewed - No data to display  EKG   Radiology No results found.  Procedures Procedures (including critical care time)  Medications Ordered in UC Medications - No data to display  Initial Impression / Assessment and Plan / UC Course  I have reviewed the triage vital signs and the nursing notes.  Pertinent labs & imaging results that were available during my care of the patient were reviewed by me and considered in my medical decision making (see chart for details).    Acute sinusitis.  Patient has been symptomatic for 4 days.  Discussed continued symptomatic treatment including Mucinex, Tylenol or ibuprofen.  If not improving in the next couple of days, start Bactrim as patient reports this has worked well for her in the past.  Instructed patient to follow up with her PCP if her symptoms are not improving.  She agrees to plan of care.   Final Clinical Impressions(s) / UC Diagnoses   Final  diagnoses:  Acute non-recurrent maxillary sinusitis     Discharge Instructions      Continue symptomatic treatment.  If you are not improving in the next 2 to 3 days, start the Bactrim as directed.  Follow-up with your primary care provider if your symptoms are not improving.      ED Prescriptions     Medication Sig Dispense Auth. Provider   sulfamethoxazole-trimethoprim (BACTRIM DS) 800-160 MG tablet Take 1 tablet by mouth 2 (two) times daily for 7 days. 14 tablet Mickie Bail, NP      PDMP not reviewed this encounter.   Mickie Bail, NP 07/08/23 1019

## 2023-07-09 ENCOUNTER — Ambulatory Visit
Admission: RE | Admit: 2023-07-09 | Discharge: 2023-07-09 | Disposition: A | Discharge status: Home / Self Care | Payer: BC Managed Care – PPO | Source: Ambulatory Visit | Attending: Internal Medicine | Admitting: Internal Medicine

## 2023-07-09 DIAGNOSIS — Z1231 Encounter for screening mammogram for malignant neoplasm of breast: Secondary | ICD-10-CM | POA: Diagnosis present

## 2023-07-10 ENCOUNTER — Encounter: Payer: Self-pay | Admitting: Internal Medicine

## 2023-07-13 ENCOUNTER — Other Ambulatory Visit (INDEPENDENT_AMBULATORY_CARE_PROVIDER_SITE_OTHER): Payer: BC Managed Care – PPO

## 2023-07-13 DIAGNOSIS — E871 Hypo-osmolality and hyponatremia: Secondary | ICD-10-CM

## 2023-07-14 LAB — SODIUM: Sodium: 135 meq/L (ref 135–145)

## 2023-07-16 ENCOUNTER — Telehealth: Payer: Self-pay

## 2023-07-16 NOTE — Telephone Encounter (Addendum)
Pt called back and I read the note to her and she stated it was fine with f/u

## 2023-07-16 NOTE — Telephone Encounter (Signed)
-----   Message from [place] sent at 07/15/2023  7:09 PM EDT ----- Carly Fields that her ultrasound reveals the right kidney cyst.  Has been evaluated previously by Dr Apolinar Junes.  Please notify her of results and see if agreeable for f/u with Dr Apolinar Junes to confirm no further w/up warranted.

## 2023-07-16 NOTE — Telephone Encounter (Signed)
Noted  

## 2023-09-02 ENCOUNTER — Telehealth: Payer: Self-pay | Admitting: Internal Medicine

## 2023-09-02 NOTE — Telephone Encounter (Signed)
Copied from CRM [id number]. Topic: Appointments - Appointment Info/Confirmation >> Sep 02, 2023  3:08 PM Kathryne Eriksson wrote: Patient/patient representative is calling for information regarding an appointment.

## 2023-09-02 NOTE — Telephone Encounter (Signed)
Left message for patient to return call need to schedule for Follow up after recent surgery at the appt slot provided in message.

## 2023-09-02 NOTE — Telephone Encounter (Signed)
Patient scheduled tomorrow at 4 as virtual advised pap wanted follow up see how patient was after surgery.

## 2023-09-02 NOTE — Telephone Encounter (Signed)
Second attempt made to reach patient. NO answer please schedule.

## 2023-09-02 NOTE — Telephone Encounter (Signed)
Please call Carly Fields and see if she would be agreeable to come see me tomorrow.  A relative of hers was concerned she was not feeling well and I would like to see her.  I have a 1:30 and a 4:00 appt tomorrow (Thursday)

## 2023-09-02 NOTE — Progress Notes (Signed)
[name], female   DOB: Nov 04, 1960, 62 y.o.   MRN: [id number]   Virtual Visit via video Note  I connected with Angie Calixte by a video enabled telemedicine application and verified that I am speaking with the correct person using two identifiers. Location patient: home Location provider: work  Persons participating in the virtual visit: patient, provider  The limitations, risks, security and privacy concerns of performing an evaluation and management service by video and the availability of in person appointments have been discussed. It has also been discussed with the patient that there may be a patient responsible charge related to this service. The patient expressed understanding and agreed to proceed.   Reason for visit: work in appt.   HPI: Work in [employer] - work in for increased stress. Is s/p left trigger thumb release- Dr Hyacinth Meeker. Continue f/u with ortho. Increased stress.  Discussed. Overall she feels she is handling things relatively well.  Going out with friends - concert recently. Overall she feels she is handling stress relatively well. Taking zoloft. Does not feel needs any further intervention. Breathing stable.    ROS: See pertinent positives and negatives per HPI.  Past Medical History:  Diagnosis Date   Abnormal liver function    Allergy    Anemia    GERD (gastroesophageal reflux disease)    Heart murmur    Hypercholesterolemia    Hyperglycemia    Hypertension     Past Surgical History:  Procedure Laterality Date   ABDOMINAL HYSTERECTOMY  2010   ACHILLES TENDON REPAIR  2009   BREAST BIOPSY Left 07/30/2006   benign   BREAST BIOPSY Left 02/18/2007 & 03/05/2007   benign   BREAST EXCISIONAL BIOPSY     BREAST SURGERY  2008   LUMBAR MICRODISCECTOMY  2004   ROTATOR CUFF REPAIR  2006   SEPTOPLASTY  1990   TONSILLECTOMY AND ADENOIDECTOMY  1969    Family History  Problem Relation Age of Onset   Hyperlipidemia Mother    Hypertension Mother     Stroke Mother    Heart disease Mother    Hypothyroidism Mother    Pernicious anemia Mother        aunt x 2   Hyperlipidemia Maternal Grandmother    Ulcers Maternal Grandmother    Lung cancer Father        Hx smoker   Hypertension Maternal Grandfather        MI   Heart disease Maternal Grandfather        myocardial infarction   Kidney cancer Paternal Grandmother    Ovarian cancer Maternal Aunt    Breast cancer Paternal Aunt    Diabetes Other    Hypothyroidism Other        aunt x 2   Colon cancer Neg Hx     SOCIAL HX: reviewed.    Current Outpatient Medications:    albuterol (VENTOLIN HFA) 108 (90 Base) MCG/ACT inhaler, Inhale 2 puffs into the lungs every 6 (six) hours as needed for wheezing or shortness of breath., Disp: 1 each, Rfl: 1   amphetamine-dextroamphetamine (ADDERALL XR) 10 MG 24 hr capsule, Take 10 mg by mouth every morning., Disp: , Rfl:    atorvastatin (LIPITOR) 10 MG tablet, Take 1 tablet (10 mg total) by mouth daily., Disp: 90 tablet, Rfl: 3   Biotin 1000 MCG CHEW, Chew by mouth., Disp: , Rfl:    Cholecalciferol (VITAMIN D3) 1000 units CAPS, Take 1 capsule by mouth daily. , Disp: ,  Rfl:    Cyanocobalamin (B-12) 250 MCG TABS, Take 250 mcg by mouth daily., Disp: , Rfl:    fluticasone (FLONASE) 50 MCG/ACT nasal spray, Place 2 sprays into the nose as needed for rhinitis., Disp: , Rfl:    lisinopril (ZESTRIL) 20 MG tablet, Take 1 tablet (20 mg total) by mouth daily., Disp: 90 tablet, Rfl: 3   loratadine (CLARITIN) 10 MG tablet, Take 10 mg by mouth daily., Disp: , Rfl:    metFORMIN (GLUCOPHAGE-XR) 500 MG 24 hr tablet, Take 1 tablet (500 mg total) by mouth 2 (two) times daily., Disp: 180 tablet, Rfl: 3   sertraline (ZOLOFT) 25 MG tablet, 1/2 - 1 tablet q day, Disp: 90 tablet, Rfl: 1   vitamin E 400 UNIT capsule, Take 400 Units by mouth daily., Disp: , Rfl:   EXAM:  GENERAL: alert, oriented, appears well and in no acute distress  HEENT: atraumatic, conjunttiva clear,  no obvious abnormalities on inspection of external nose and ears  NECK: normal movements of the head and neck  LUNGS: on inspection no signs of respiratory distress, breathing rate appears normal, no obvious gross SOB, gasping or wheezing  CV: no obvious cyanosis  PSYCH/NEURO: pleasant and cooperative, no obvious depression or anxiety, speech and thought processing grossly intact  ASSESSMENT AND PLAN:  Discussed the following assessment and plan:  Problem List Items Addressed This Visit     Essential (primary) hypertension - Primary   Continues on lisinopril. Follow pressures. Follow metabolic panel.       Stress   Going to therapy.  S/p ADHD testing. On adderall.  Continue zoloft. Overall feels handling things relatively well.  Follow.        Return if symptoms worsen or fail to improve.   I discussed the assessment and treatment plan with the patient. The patient was provided an opportunity to ask questions and all were answered. The patient agreed with the plan and demonstrated an understanding of the instructions.   The patient was advised to call back or seek an in-person evaluation if the symptoms worsen or if the condition fails to improve as anticipated.    Dale [place], MD

## 2023-09-03 ENCOUNTER — Telehealth: Payer: BC Managed Care – PPO | Admitting: Internal Medicine

## 2023-09-03 DIAGNOSIS — F439 Reaction to severe stress, unspecified: Secondary | ICD-10-CM | POA: Diagnosis not present

## 2023-09-03 DIAGNOSIS — I10 Essential (primary) hypertension: Secondary | ICD-10-CM

## 2023-09-03 MED ORDER — ALBUTEROL SULFATE HFA 108 (90 BASE) MCG/ACT IN AERS: 2.0000 | INHALATION_SPRAY | Freq: Four times a day (QID) | RESPIRATORY_TRACT | 1 refills | Status: AC | PRN

## 2023-09-06 ENCOUNTER — Encounter: Payer: Self-pay | Admitting: Internal Medicine

## 2023-09-06 NOTE — Assessment & Plan Note (Signed)
 Continues on lisinopril.  Follow pressures.  Follow metabolic panel.

## 2023-09-06 NOTE — Assessment & Plan Note (Signed)
Going to therapy.  S/p ADHD testing. On adderall.  Continue zoloft. Overall feels handling things relatively well.  Follow.

## 2023-09-28 ENCOUNTER — Telehealth: Payer: Self-pay | Admitting: Internal Medicine

## 2023-09-28 DIAGNOSIS — E1165 Type 2 diabetes mellitus with hyperglycemia: Secondary | ICD-10-CM

## 2023-09-28 DIAGNOSIS — E7800 Pure hypercholesterolemia, unspecified: Secondary | ICD-10-CM

## 2023-09-28 DIAGNOSIS — D649 Anemia, unspecified: Secondary | ICD-10-CM

## 2023-09-28 DIAGNOSIS — I10 Essential (primary) hypertension: Secondary | ICD-10-CM

## 2023-09-28 NOTE — Telephone Encounter (Signed)
 Patient need lab orders.

## 2023-09-29 ENCOUNTER — Ambulatory Visit: Payer: BC Managed Care – PPO | Admitting: Internal Medicine

## 2023-09-29 NOTE — Addendum Note (Signed)
 Addended by: Rita Ohara D on: 09/29/2023 07:56 AM   Modules accepted: Orders

## 2023-09-29 NOTE — Telephone Encounter (Signed)
 Labs ordered.

## 2023-10-08 ENCOUNTER — Other Ambulatory Visit: Payer: BC Managed Care – PPO

## 2023-10-12 ENCOUNTER — Ambulatory Visit: Payer: BC Managed Care – PPO | Admitting: Internal Medicine

## 2023-10-24 ENCOUNTER — Encounter: Payer: Self-pay | Admitting: Internal Medicine

## 2023-10-26 ENCOUNTER — Ambulatory Visit: Payer: 59 | Admitting: Internal Medicine

## 2023-10-26 VITALS — BP 118/78 | HR 82 | Temp 98.0°F | Ht 63.0 in | Wt 191.6 lb

## 2023-10-26 DIAGNOSIS — R509 Fever, unspecified: Secondary | ICD-10-CM | POA: Diagnosis not present

## 2023-10-26 DIAGNOSIS — R059 Cough, unspecified: Secondary | ICD-10-CM

## 2023-10-26 DIAGNOSIS — J029 Acute pharyngitis, unspecified: Secondary | ICD-10-CM | POA: Diagnosis not present

## 2023-10-26 DIAGNOSIS — J069 Acute upper respiratory infection, unspecified: Secondary | ICD-10-CM | POA: Diagnosis not present

## 2023-10-26 LAB — POCT INFLUENZA A/B
Influenza A, POC: NEGATIVE
Influenza B, POC: NEGATIVE

## 2023-10-26 NOTE — Telephone Encounter (Signed)
 Pt scheduled with Dr Heide Spark

## 2023-10-26 NOTE — Patient Instructions (Signed)
-  It was a pleasure meeting you today -I suspect that you have a viral upper respiratory tract infection.  You tested negative for COVID at home on Friday and your flu test was negative today -There is no indication for antibiotics at this time especially since your symptoms appear to be improving -Will continue conservative measures including saline nasal spray, Nettie pot, warm showers with steam inhalation as well as Mucinex -If your symptoms worsen or do not improve by the end of the week please call us  for further evaluation -No further workup at this time

## 2023-10-26 NOTE — Progress Notes (Signed)
 Acute Office Visit  Subjective:     [name], female    DOB: [birth year], 63 y.o.   MRN: [id number]  Chief Complaint  Patient presents with   Cough    Cough, congestion , fever of 102.5 and sore throat since Wednesday night    Cough Associated symptoms include chills, a fever and a sore throat. Pertinent negatives include no ear pain, headaches, shortness of breath or wheezing.   Patient is in today for worsening cough, fevers, malaise and nasal congestion since last week.  Patient states that on Wednesday she began to have sore throat and started Zicam.  After that, patient developed nasal congestion and rhinorrhea with clear discharge.  She also developed a cough occasionally productive of yellowish/greenish phlegm.  She states that she started to become low-grade fevers on Thursday but on Saturday had a fever up to 102.5 F.  Patient states that since then her symptoms appear to be slowly improving and her last low-grade fever was last night.  She states that she did have Bactrim  at home and started taking that on Thursday to see if this would help with her symptoms.  Patient states that she is not taking Mucinex DM as well as Mucinex fast max without significant relief.  She did test for COVID on Friday and it was negative.  She does complain of occasional bilateral ear pressure but none today.  Patient sore throat also resolved over the weekend.  Review of Systems  Constitutional:  Positive for chills, fever and malaise/fatigue.  HENT:  Positive for congestion and sore throat. Negative for ear pain, hearing loss and sinus pain.   Respiratory:  Positive for cough and sputum production. Negative for shortness of breath and wheezing.   Neurological:  Negative for headaches.        Objective:    BP 118/78   Pulse 82   Temp 98 F (36.7 C)   Ht 5\' 3"  (1.6 m)   Wt 191 lb 9.6 oz (86.9 kg)   SpO2 98%   BMI 33.94 kg/m    Physical Exam Constitutional:       Appearance: Normal appearance.  HENT:     Head: Normocephalic and atraumatic.     Right Ear: Tympanic membrane, ear canal and external ear normal.     Left Ear: Tympanic membrane, ear canal and external ear normal.     Nose: Congestion and rhinorrhea present.     Right Sinus: No maxillary sinus tenderness or frontal sinus tenderness.     Left Sinus: No maxillary sinus tenderness or frontal sinus tenderness.     Mouth/Throat:     Mouth: Mucous membranes are moist.     Pharynx: Oropharynx is clear. Posterior oropharyngeal erythema present.     Comments: Mild pharyngeal erythema noted Cardiovascular:     Rate and Rhythm: Normal rate and regular rhythm.     Heart sounds: Normal heart sounds.  Pulmonary:     Effort: Pulmonary effort is normal.     Breath sounds: Normal breath sounds. No wheezing, rhonchi or rales.  Neurological:     Mental Status: She is alert.  Psychiatric:        Mood and Affect: Mood normal.        Behavior: Behavior normal.     Results for orders placed or performed in visit on 10/26/23  POCT Influenza A/B  Result Value Ref Range   Influenza A, POC Negative Negative   Influenza B, POC Negative  Negative        Assessment & Plan:   Problem List Items Addressed This Visit       Respiratory   URI (upper respiratory infection)   -Patient presented today with fevers, cough, nasal congestion and rhinorrhea beginning last week -Patient's symptoms appear to be improving with her last high fever of 102.5 C on Saturday.  No fevers at all today.  Patient also states that her symptoms appear to be improving as well -On exam today her lungs are clear to auscultation, she did have nasal congestion as well as rhinorrhea.  No sinus tenderness noted. -I suspect that she likely has a viral URI and her symptoms already appear to be improving -Will continue with conservative measures like Mucinex as well as saline nasal rinses.  Patient was also instructed to try  over-the-counter Nettie pot and warm showers with steam inhalation -No further workup at this time.  Patient was given return precautions.      Other Visit Diagnoses       Cough, unspecified type    -  Primary   Relevant Orders   POCT Influenza A/B (Completed)     Fever, unspecified fever cause       Relevant Orders   POCT Influenza A/B (Completed)     Sore throat       Relevant Orders   POCT Influenza A/B (Completed)       No orders of the defined types were placed in this encounter.   No follow-ups on file.  Nandana Krolikowski, MD

## 2023-10-26 NOTE — Assessment & Plan Note (Signed)
-  Patient presented today with fevers, cough, nasal congestion and rhinorrhea beginning last week -Patient's symptoms appear to be improving with her last high fever of 102.5 C on Saturday.  No fevers at all today.  Patient also states that her symptoms appear to be improving as well -On exam today her lungs are clear to auscultation, she did have nasal congestion as well as rhinorrhea.  No sinus tenderness noted. -I suspect that she likely has a viral URI and her symptoms already appear to be improving -Will continue with conservative measures like Mucinex as well as saline nasal rinses.  Patient was also instructed to try over-the-counter Nettie pot and warm showers with steam inhalation -No further workup at this time.  Patient was given return precautions.

## 2023-11-09 LAB — HM DIABETES EYE EXAM

## 2023-12-10 ENCOUNTER — Other Ambulatory Visit (INDEPENDENT_AMBULATORY_CARE_PROVIDER_SITE_OTHER): Payer: BC Managed Care – PPO

## 2023-12-10 DIAGNOSIS — E1165 Type 2 diabetes mellitus with hyperglycemia: Secondary | ICD-10-CM

## 2023-12-10 DIAGNOSIS — D649 Anemia, unspecified: Secondary | ICD-10-CM | POA: Diagnosis not present

## 2023-12-10 DIAGNOSIS — I10 Essential (primary) hypertension: Secondary | ICD-10-CM | POA: Diagnosis not present

## 2023-12-10 DIAGNOSIS — E7800 Pure hypercholesterolemia, unspecified: Secondary | ICD-10-CM | POA: Diagnosis not present

## 2023-12-10 LAB — BASIC METABOLIC PANEL WITH GFR
BUN: 22 mg/dL (ref 6–23)
CO2: 28 meq/L (ref 19–32)
Calcium: 9.5 mg/dL (ref 8.4–10.5)
Chloride: 97 meq/L (ref 96–112)
Creatinine, Ser: 0.93 mg/dL (ref 0.40–1.20)
GFR: 65.61 mL/min (ref >60.00)
Glucose, Bld: 112 mg/dL — ABNORMAL HIGH (ref 70–99)
Potassium: 4.5 meq/L (ref 3.5–5.1)
Sodium: 133 meq/L — ABNORMAL LOW (ref 135–145)

## 2023-12-10 LAB — HEMOGLOBIN A1C: Hgb A1c MFr Bld: 5.9 % (ref 4.6–6.5)

## 2023-12-10 LAB — LIPID PANEL
Cholesterol: 188 mg/dL (ref 0–200)
HDL: 48.3 mg/dL (ref >39.00)
LDL Cholesterol: 105 mg/dL — ABNORMAL HIGH (ref 0–99)
NonHDL: 139.72
Total CHOL/HDL Ratio: 4
Triglycerides: 172 mg/dL — ABNORMAL HIGH (ref 0.0–149.0)
VLDL: 34.4 mg/dL (ref 0.0–40.0)

## 2023-12-10 LAB — CBC WITH DIFFERENTIAL/PLATELET
Basophils Absolute: 0 10*3/uL (ref 0.0–0.1)
Basophils Relative: 0.8 % (ref 0.0–3.0)
Eosinophils Absolute: 0.4 10*3/uL (ref 0.0–0.7)
Eosinophils Relative: 8.1 % — ABNORMAL HIGH (ref 0.0–5.0)
HCT: 41.6 % (ref 36.0–46.0)
Hemoglobin: 13.9 g/dL (ref 12.0–15.0)
Lymphocytes Relative: 24.5 % (ref 12.0–46.0)
Lymphs Abs: 1.2 10*3/uL (ref 0.7–4.0)
MCHC: 33.3 g/dL (ref 30.0–36.0)
MCV: 82.9 fl (ref 78.0–100.0)
Monocytes Absolute: 0.3 10*3/uL (ref 0.1–1.0)
Monocytes Relative: 7.3 % (ref 3.0–12.0)
Neutro Abs: 2.8 10*3/uL (ref 1.4–7.7)
Neutrophils Relative %: 59.3 % (ref 43.0–77.0)
Platelets: 236 10*3/uL (ref 150.0–400.0)
RBC: 5.02 Mil/uL (ref 3.87–5.11)
RDW: 13.7 % (ref 11.5–15.5)
WBC: 4.8 10*3/uL (ref 4.0–10.5)

## 2023-12-10 LAB — HEPATIC FUNCTION PANEL
ALT: 21 U/L (ref 0–35)
AST: 17 U/L (ref 0–37)
Albumin: 4.4 g/dL (ref 3.5–5.2)
Alkaline Phosphatase: 60 U/L (ref 39–117)
Bilirubin, Direct: 0 mg/dL (ref 0.0–0.3)
Total Bilirubin: 0.4 mg/dL (ref 0.2–1.2)
Total Protein: 7 g/dL (ref 6.0–8.3)

## 2023-12-11 LAB — TSH: TSH: 1.56 u[IU]/mL (ref 0.35–5.50)

## 2023-12-15 ENCOUNTER — Encounter: Payer: Self-pay | Admitting: Internal Medicine

## 2023-12-15 ENCOUNTER — Ambulatory Visit: Payer: BC Managed Care – PPO | Admitting: Internal Medicine

## 2023-12-15 VITALS — BP 114/70 | HR 64 | Temp 97.9°F | Resp 16 | Ht 62.0 in | Wt 192.8 lb

## 2023-12-15 DIAGNOSIS — N281 Cyst of kidney, acquired: Secondary | ICD-10-CM | POA: Diagnosis not present

## 2023-12-15 DIAGNOSIS — F439 Reaction to severe stress, unspecified: Secondary | ICD-10-CM

## 2023-12-15 DIAGNOSIS — E1165 Type 2 diabetes mellitus with hyperglycemia: Secondary | ICD-10-CM

## 2023-12-15 DIAGNOSIS — E871 Hypo-osmolality and hyponatremia: Secondary | ICD-10-CM

## 2023-12-15 DIAGNOSIS — E7800 Pure hypercholesterolemia, unspecified: Secondary | ICD-10-CM | POA: Diagnosis not present

## 2023-12-15 DIAGNOSIS — E559 Vitamin D deficiency, unspecified: Secondary | ICD-10-CM

## 2023-12-15 DIAGNOSIS — I10 Essential (primary) hypertension: Secondary | ICD-10-CM | POA: Diagnosis not present

## 2023-12-15 DIAGNOSIS — Z7984 Long term (current) use of oral hypoglycemic drugs: Secondary | ICD-10-CM

## 2023-12-15 DIAGNOSIS — R0981 Nasal congestion: Secondary | ICD-10-CM

## 2023-12-15 MED ORDER — SERTRALINE HCL 25 MG PO TABS: 25.0000 mg | ORAL_TABLET | Freq: Every day | ORAL | 1 refills | Status: DC

## 2023-12-15 NOTE — Assessment & Plan Note (Signed)
 Saline nasal spray and flonase. Follow.  Call with update.

## 2023-12-15 NOTE — Assessment & Plan Note (Signed)
 Going to therapy.  S/p ADHD testing. On adderall.  Increased stress recently. Cousin recently passed. Continue zoloft. Overall feels handling things relatively well.  Follow.

## 2023-12-15 NOTE — Assessment & Plan Note (Addendum)
 On lipitor. Currently taking 4 days per week. Will increase to 5 days per week. Tolerating.   Low cholesterol diet and exercise.  Follow lipid panel and liver function tests.  The 10-year ASCVD risk score (Arnett DK, et al., 2019) is: 10%   Values used to calculate the score:     Age: 63 years     Sex: Female     Is Non-Hispanic African American: No     Diabetic: Yes     Tobacco smoker: No     Systolic Blood Pressure: 118 mmHg     Is BP treated: Yes     HDL Cholesterol: 48.3 mg/dL     Total Cholesterol: 188 mg/dL

## 2023-12-15 NOTE — Assessment & Plan Note (Signed)
 Saw urology (Dr Apolinar Junes) 06/15/20.  Note reviewed.  Recommended f/u renal ultrasound in 2 years.  Had abdominal ultrasound 2022 - stable cyst.  Had f/u renal ultrasound 06/2023 - septted cyst. Discussed. Refer back to Dr Apolinar Junes for evaluation.

## 2023-12-15 NOTE — Progress Notes (Signed)
 Subjective:    [name], female    DOB: [birth year], 63 y.o.   MRN: [id number]  Patient here for  Chief Complaint  Patient presents with   Medical Management of Chronic Issues    HPI Here for a scheduled follow up - follow up regarding hypercholesterolemia, hypertension and increased stress. Continues on zoloft. Seeing a therapist. Continues on adderall. Increased stress recently with her cousin's passing. Discussed. Does not feel needs any further intervention at this time. Breathing stable. Some persistent congestion. No sore throat. No sinus pressure. No chest congestion. No abdominal pain or bowel change    Past Medical History:  Diagnosis Date   Abnormal liver function    Allergy    Anemia    GERD (gastroesophageal reflux disease)    Heart murmur    Hypercholesterolemia    Hyperglycemia    Hypertension    Past Surgical History:  Procedure Laterality Date   ABDOMINAL HYSTERECTOMY  2010   ACHILLES TENDON REPAIR  2009   BREAST BIOPSY Left 07/30/2006   benign   BREAST BIOPSY Left 02/18/2007 & 03/05/2007   benign   BREAST EXCISIONAL BIOPSY     BREAST SURGERY  2008   LUMBAR MICRODISCECTOMY  2004   ROTATOR CUFF REPAIR  2006   SEPTOPLASTY  1990   TONSILLECTOMY AND ADENOIDECTOMY  1969   Family History  Problem Relation Age of Onset   Hyperlipidemia Mother    Hypertension Mother    Stroke Mother    Heart disease Mother    Hypothyroidism Mother    Pernicious anemia Mother        aunt x 2   Hyperlipidemia Maternal Grandmother    Ulcers Maternal Grandmother    Lung cancer Father        Hx smoker   Hypertension Maternal Grandfather        MI   Heart disease Maternal Grandfather        myocardial infarction   Kidney cancer Paternal Grandmother    Ovarian cancer Maternal Aunt    Breast cancer Paternal Aunt    Diabetes Other    Hypothyroidism Other        aunt x 2   Colon cancer Neg Hx    Social History   Socioeconomic History   Marital  status: Single    Spouse name: Not on file   Number of children: 0   Years of education: Not on file   Highest education level: Associate degree: occupational, Scientist, product/process development, or vocational program  Occupational History   Not on file  Tobacco Use   Smoking status: Never   Smokeless tobacco: Never  Vaping Use   Vaping status: Never Used  Substance and Sexual Activity   Alcohol use: Not Currently    Comment: rare   Drug use: No   Sexual activity: Not on file  Other Topics Concern   Not on file  Social History Narrative   She is single and has no children. 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    Social Drivers of Corporate investment banker Strain: Low Risk  (10/26/2023)   Overall Financial Resource Strain (CARDIA)    Difficulty of Paying Living Expenses: Not hard at all  Food Insecurity: No Food Insecurity (10/26/2023)   Hunger Vital Sign    Worried About Running Out of Food in the Last Year: Never true    Ran Out of Food in the Last Year: Never true  Transportation  Needs: No Transportation Needs (10/26/2023)   PRAPARE - Administrator, Civil Service (Medical): No    Lack of Transportation (Non-Medical): No  Physical Activity: Insufficiently Active (10/26/2023)   Exercise Vital Sign    Days of Exercise per Week: 2 days    Minutes of Exercise per Session: 10 min  Stress: No Stress Concern Present (10/26/2023)   [employer] of Occupational Health - Occupational Stress Questionnaire    Feeling of Stress : Not at all  Social Connections: Moderately Integrated (10/26/2023)   Social Connection and Isolation Panel [NHANES]    Frequency of Communication with Friends and Family: More than three times a week    Frequency of Social Gatherings with Friends and Family: Once a week    Attends Religious Services: More than 4 times per year    Active Member of [employer] or Organizations: Yes    Attends Engineer, structural: More than 4 times per  year    Marital Status: Never married     Review of Systems  Constitutional:  Negative for appetite change and unexpected weight change.  HENT:  Positive for congestion. Negative for sinus pressure.   Respiratory:  Negative for cough, chest tightness and shortness of breath.   Cardiovascular:  Negative for chest pain, palpitations and leg swelling.  Gastrointestinal:  Negative for abdominal pain, diarrhea, nausea and vomiting.  Genitourinary:  Negative for difficulty urinating and dysuria.  Musculoskeletal:  Negative for joint swelling and myalgias.  Skin:  Negative for color change and rash.  Neurological:  Negative for dizziness and headaches.  Psychiatric/Behavioral:  Negative for agitation and dysphoric mood.        Objective:     BP 114/70   Pulse 64   Temp 97.9 F (36.6 C)   Resp 16   Ht 5\' 2"  (1.575 m)   Wt 192 lb 12.8 oz (87.5 kg)   SpO2 98%   BMI 35.26 kg/m  Wt Readings from Last 3 Encounters:  12/15/23 192 lb 12.8 oz (87.5 kg)  10/26/23 191 lb 9.6 oz (86.9 kg)  06/10/23 191 lb (86.6 kg)    Physical Exam Vitals reviewed.  Constitutional:      General: She is not in acute distress.    Appearance: Normal appearance.  HENT:     Head: Normocephalic and atraumatic.     Right Ear: External ear normal.     Left Ear: External ear normal.     Nose:     Comments: Erythematous turbinates.     Mouth/Throat:     Pharynx: No oropharyngeal exudate or posterior oropharyngeal erythema.  Eyes:     General: No scleral icterus.       Right eye: No discharge.        Left eye: No discharge.     Conjunctiva/sclera: Conjunctivae normal.  Neck:     Thyroid: No thyromegaly.  Cardiovascular:     Rate and Rhythm: Normal rate and regular rhythm.  Pulmonary:     Effort: No respiratory distress.     Breath sounds: Normal breath sounds. No wheezing.  Abdominal:     General: Bowel sounds are normal.     Palpations: Abdomen is soft.     Tenderness: There is no abdominal  tenderness.  Musculoskeletal:        General: No swelling or tenderness.     Cervical back: Neck supple. No tenderness.  Lymphadenopathy:     Cervical: No cervical adenopathy.  Skin:    Findings:  No erythema or rash.  Neurological:     Mental Status: She is alert.  Psychiatric:        Mood and Affect: Mood normal.        Behavior: Behavior normal.         Outpatient Encounter Medications as of 12/15/2023  Medication Sig   amphetamine-dextroamphetamine (ADDERALL) 15 MG tablet Take 0.5 tablets by mouth daily.   albuterol (VENTOLIN HFA) 108 (90 Base) MCG/ACT inhaler Inhale 2 puffs into the lungs every 6 (six) hours as needed for wheezing or shortness of breath.   atorvastatin (LIPITOR) 10 MG tablet Take 1 tablet (10 mg total) by mouth daily.   Biotin 1000 MCG CHEW Chew by mouth.   Cholecalciferol (VITAMIN D3) 1000 units CAPS Take 1 capsule by mouth daily.    Cyanocobalamin (B-12) 250 MCG TABS Take 250 mcg by mouth daily.   fluticasone (FLONASE) 50 MCG/ACT nasal spray Place 2 sprays into the nose as needed for rhinitis.   lisinopril (ZESTRIL) 20 MG tablet Take 1 tablet (20 mg total) by mouth daily.   loratadine (CLARITIN) 10 MG tablet Take 10 mg by mouth daily.   metFORMIN (GLUCOPHAGE-XR) 500 MG 24 hr tablet Take 1 tablet (500 mg total) by mouth 2 (two) times daily.   sertraline (ZOLOFT) 25 MG tablet Take 1 tablet (25 mg total) by mouth daily.   vitamin E 400 UNIT capsule Take 400 Units by mouth daily.   [DISCONTINUED] amphetamine-dextroamphetamine (ADDERALL XR) 10 MG 24 hr capsule Take 10 mg by mouth every morning.   [DISCONTINUED] sertraline (ZOLOFT) 25 MG tablet 1/2 - 1 tablet q day (Patient taking differently: 1 tablet q day)   No facility-administered encounter medications on file as of 12/15/2023.     Lab Results  Component Value Date   WBC 4.8 12/10/2023   HGB 13.9 12/10/2023   HCT 41.6 12/10/2023   PLT 236.0 12/10/2023   GLUCOSE 112 (H) 12/10/2023   CHOL 188 12/10/2023    TRIG 172.0 (H) 12/10/2023   HDL 48.30 12/10/2023   LDLDIRECT 126.0 05/02/2022   LDLCALC 105 (H) 12/10/2023   ALT 21 12/10/2023   AST 17 12/10/2023   NA 133 (L) 12/10/2023   K 4.5 12/10/2023   CL 97 12/10/2023   CREATININE 0.93 12/10/2023   BUN 22 12/10/2023   CO2 28 12/10/2023   TSH 1.56 12/10/2023   INR 1.0 07/13/2017   HGBA1C 5.9 12/10/2023   MICROALBUR <0.7 02/04/2023    MM 3D SCREENING MAMMOGRAM BILATERAL BREAST Result Date: 07/13/2023 CLINICAL DATA:  Screening. EXAM: DIGITAL SCREENING BILATERAL MAMMOGRAM WITH TOMOSYNTHESIS AND CAD TECHNIQUE: Bilateral screening digital craniocaudal and mediolateral oblique mammograms were obtained. Bilateral screening digital breast tomosynthesis was performed. The images were evaluated with computer-aided detection. COMPARISON:  Previous exam(s). ACR Breast Density Category a: The breasts are almost entirely fatty. FINDINGS: There are no findings suspicious for malignancy. IMPRESSION: No mammographic evidence of malignancy. A result letter of this screening mammogram will be mailed directly to the patient. RECOMMENDATION: Screening mammogram in one year. (Code:[id number]-B-01Y) BI-RADS CATEGORY  1: Negative. Electronically Signed   By: Frederico Hamman M.D.   On: 07/13/2023 14:34       Assessment & Plan:  Type 2 diabetes mellitus with hyperglycemia, without long-term current use of insulin (HCC) Assessment & Plan:  On metformin.  Continue low carb diet and exercise.  Follow met b and a1c.  Lab Results  Component Value Date   HGBA1C 5.9 12/10/2023     Orders: -  Hemoglobin A1c; Future  Essential (primary) hypertension Assessment & Plan: Continues on lisinopril. Follow pressures. Follow metabolic panel.   Orders: -     Basic metabolic panel with GFR; Future  Pure hypercholesterolemia Assessment & Plan: On lipitor. Currently taking 4 days per week. Will increase to 5 days per week. Tolerating.   Low cholesterol diet and exercise.   Follow lipid panel and liver function tests.  The 10-year ASCVD risk score (Arnett DK, et al., 2019) is: 10%   Values used to calculate the score:     Age: 56 years     Sex: Female     Is Non-Hispanic African American: No     Diabetic: Yes     Tobacco smoker: No     Systolic Blood Pressure: 118 mmHg     Is BP treated: Yes     HDL Cholesterol: 48.3 mg/dL     Total Cholesterol: 188 mg/dL   Orders: -     Hepatic function panel; Future -     Lipid panel; Future  Renal cyst Assessment & Plan: Saw urology (Dr Apolinar Junes) 06/15/20.  Note reviewed.  Recommended f/u renal ultrasound in 2 years.  Had abdominal ultrasound 2022 - stable cyst.  Had f/u renal ultrasound 06/2023 - septted cyst. Discussed. Refer back to Dr Apolinar Junes for evaluation.   Orders: -     Ambulatory referral to Urology  Vitamin D deficiency Assessment & Plan: Follow vitamin D level.     Stress Assessment & Plan: Going to therapy.  S/p ADHD testing. On adderall.  Increased stress recently. Cousin recently passed. Continue zoloft. Overall feels handling things relatively well.  Follow.    Hyponatremia Assessment & Plan: Sodium slightly decreased on recent check. Recheck sodium in several weeks.   Orders: -     Sodium; Future  Nasal congestion Assessment & Plan: Saline nasal spray and flonase. Follow.  Call with update.    Other orders -     Sertraline HCl; Take 1 tablet (25 mg total) by mouth daily.  Dispense: 90 tablet; Refill: 1     Dale [place], MD

## 2023-12-15 NOTE — Assessment & Plan Note (Signed)
 On metformin.  Continue low carb diet and exercise.  Follow met b and a1c.  Lab Results  Component Value Date   HGBA1C 5.9 12/10/2023

## 2023-12-15 NOTE — Assessment & Plan Note (Signed)
 Sodium slightly decreased on recent check. Recheck sodium in several weeks.

## 2023-12-15 NOTE — Assessment & Plan Note (Signed)
 Continues on lisinopril.  Follow pressures.  Follow metabolic panel.

## 2023-12-15 NOTE — Assessment & Plan Note (Signed)
Follow vitamin D level.  

## 2023-12-24 ENCOUNTER — Ambulatory Visit: Admitting: Urology

## 2023-12-24 VITALS — BP 133/7 | HR 67 | Ht 63.0 in | Wt 194.0 lb

## 2023-12-24 DIAGNOSIS — N281 Cyst of kidney, acquired: Secondary | ICD-10-CM

## 2023-12-24 DIAGNOSIS — N898 Other specified noninflammatory disorders of vagina: Secondary | ICD-10-CM

## 2023-12-24 LAB — URINALYSIS, COMPLETE
Bilirubin, UA: NEGATIVE
Glucose, UA: NEGATIVE
Ketones, UA: NEGATIVE
Leukocytes,UA: NEGATIVE
Nitrite, UA: NEGATIVE
Protein,UA: NEGATIVE
Specific Gravity, UA: 1.015 (ref 1.005–1.030)
Urobilinogen, Ur: 0.2 mg/dL (ref 0.2–1.0)
pH, UA: 5.5 (ref 5.0–7.5)

## 2023-12-24 LAB — MICROSCOPIC EXAMINATION: Bacteria, UA: NONE SEEN

## 2023-12-24 MED ORDER — ESTRING 2 MG VA RING: 1.0000 | VAGINAL_RING | VAGINAL | 4 refills | Status: DC

## 2023-12-24 MED ORDER — PREMARIN 0.625 MG/GM VA CREA: 1.0000 | TOPICAL_CREAM | Freq: Every day | VAGINAL | 12 refills | Status: DC

## 2023-12-24 NOTE — Progress Notes (Signed)
 Carly Fields,acting as a scribe for Carly Scotland, MD.,have documented all relevant documentation on the behalf of Carly Scotland, MD,as directed by  Carly Scotland, MD while in the presence of Carly Scotland, MD.  12/24/23 11:22 AM   Carly Fields 1961/06/18 [id number]  Referring provider: Dale , MD [address] [place],  [place] [address]  Chief Complaint  Patient presents with   Establish Care   renal cyst    HPI: 63 year old female with a personal history of a right renal cyst presents today to re-establish care. She was last seen in October 2021 for a 3.3 cm interpolar right renal cyst, which had been stable since 2017 and appeared benign. At that time, conservative management was recommended.   She returns today with a repeat renal ultrasound showing the cyst has increased to 4.4 cm but maintains the same benign features, classified as Bosniak II.   She also reports discomfort during pap smears, which she attributes to postmenopausal changes following a partial hysterectomy.  She mentions today that she is worried about vaginal dryness, may be getting back out into the dating world.  PMH: Past Medical History:  Diagnosis Date   Abnormal liver function    Allergy    Anemia    GERD (gastroesophageal reflux disease)    Heart murmur    Hypercholesterolemia    Hyperglycemia    Hypertension     Surgical History: Past Surgical History:  Procedure Laterality Date   ABDOMINAL HYSTERECTOMY  2010   ACHILLES TENDON REPAIR  2009   BREAST BIOPSY Left 07/30/2006   benign   BREAST BIOPSY Left 02/18/2007 & 03/05/2007   benign   BREAST EXCISIONAL BIOPSY     BREAST SURGERY  2008   LUMBAR MICRODISCECTOMY  2004   ROTATOR CUFF REPAIR  2006   SEPTOPLASTY  1990   TONSILLECTOMY AND ADENOIDECTOMY  1969    Home Medications:  Allergies as of 12/24/2023       Reactions   Penicillins Other (See Comments)   Product containing penicillin and  antibiotic (product)   Tetanus Toxoids         Medication List        Accurate as of December 24, 2023 11:22 AM. If you have any questions, ask your nurse or doctor.          albuterol 108 (90 Base) MCG/ACT inhaler Commonly known as: VENTOLIN HFA Inhale 2 puffs into the lungs every 6 (six) hours as needed for wheezing or shortness of breath.   amphetamine-dextroamphetamine 15 MG tablet Commonly known as: ADDERALL Take 0.5 tablets by mouth daily.   atorvastatin 10 MG tablet Commonly known as: LIPITOR Take 1 tablet (10 mg total) by mouth daily.   B-12 250 MCG Tabs Take 250 mcg by mouth daily.   Biotin 1000 MCG Chew Chew by mouth.   Estring 2 MG vaginal ring Generic drug: estradiol Place 1 each vaginally every 3 (three) months. follow package directions   fluticasone 50 MCG/ACT nasal spray Commonly known as: FLONASE Place 2 sprays into the nose as needed for rhinitis.   lisinopril 20 MG tablet Commonly known as: ZESTRIL Take 1 tablet (20 mg total) by mouth daily.   loratadine 10 MG tablet Commonly known as: CLARITIN Take 10 mg by mouth daily.   metFORMIN 500 MG 24 hr tablet Commonly known as: GLUCOPHAGE-XR Take 1 tablet (500 mg total) by mouth 2 (two) times daily.   Premarin vaginal cream Generic drug:  conjugated estrogens Place 1 Applicatorful vaginally daily. Use pea sized amount M-W-Fr before bedtime   sertraline 25 MG tablet Commonly known as: Zoloft Take 1 tablet (25 mg total) by mouth daily.   Vitamin D3 25 MCG (1000 UT) Caps Take 1 capsule by mouth daily.   vitamin E 180 MG (400 UNITS) capsule Take 400 Units by mouth daily.        Allergies:  Allergies  Allergen Reactions   Penicillins Other (See Comments)    Product containing penicillin and antibiotic (product)   Tetanus Toxoids     Family History: Family History  Problem Relation Age of Onset   Hyperlipidemia Mother    Hypertension Mother    Stroke Mother    Heart disease  Mother    Hypothyroidism Mother    Pernicious anemia Mother        aunt x 2   Hyperlipidemia Maternal Grandmother    Ulcers Maternal Grandmother    Lung cancer Father        Hx smoker   Hypertension Maternal Grandfather        MI   Heart disease Maternal Grandfather        myocardial infarction   Kidney cancer Paternal Grandmother    Ovarian cancer Maternal Aunt    Breast cancer Paternal Aunt    Diabetes Other    Hypothyroidism Other        aunt x 2   Colon cancer Neg Hx     Social History:  reports that she has never smoked. She has never used smokeless tobacco. She reports that she does not currently use alcohol. She reports that she does not use drugs.   Physical Exam: BP (!) 133/7   Pulse 67   Ht 5\' 3"  (1.6 m)   Wt 194 lb (88 kg)   BMI 34.37 kg/m   Constitutional:  Alert and oriented, No acute distress. HEENT: [place] AT, moist mucus membranes.  Trachea midline, no masses. Neurologic: Grossly intact, no focal deficits, moving all 4 extremities. Psychiatric: Normal mood and affect.  MPRESSION: Right kidney midpole septated 4.4 cm cyst. Otherwise unremarkable examination of the kidneys and bladder.     Electronically Signed   By: Carly Fields M.D.   On: 07/14/2023 18:00    Renal ultrasound was personally reviewed and compared to her previous.  Agree with radiologic interpretation.  Assessment & Plan:    1. Renal Cyst - She has a history of a renal cyst, initially identified in 2017, which has shown minimal growth from 3.3 cm to 4.4 cm over several years. The cyst remains Bosniak category II, with benign features such as septation without thickening, calcification, or blood flow.  - No further evaluation or follow-up is recommended as it is definitively benign.  2. Vaginal Dryness/Postmenopausal Symptoms - She reports discomfort during pap smears, likely due to postmenopausal vaginal dryness and atrophy.  - Discussed the use of estrogen therapy to alleviate  symptoms.  - Prescribed both Premarin cream and an E-Ring for local estrogen therapy (chose one).  - Advised her on the use of these treatments, including the potential messiness of the cream and the convenience of the E-Ring. - Instructed her to start treatment in anticipation of sexual activity to allow time for tissue restoration.  - Advised against using the cream immediately before intercourse to prevent transfer to a partner.  Return in about 1 year (around 12/23/2024) for reassessment of estrogen therapy or sooner if any new symptoms or concerns arise.  I have reviewed the above documentation for accuracy and completeness, and I agree with the above.   Carly Scotland, MD    [facility] [address] [place], [place] [address] [phone]

## 2023-12-29 ENCOUNTER — Encounter: Payer: Self-pay | Admitting: Internal Medicine

## 2023-12-29 NOTE — Telephone Encounter (Signed)
 Called and spoke with patient. Symptoms started Saturday with a scratchy throat. Woke up congested on Sunday. Now running fever of 102.6 (was 104.0.) Has not taken anything except for mucinex. She is going to try and alternate tylenol and ibuprofen to break fever. Offered patient virtual appointment with NP today or tomorrow AM with PCP. Patient prefers to wait until tomorrow. Confirmed no sob, no chest tightness, etc. Advised patient to be seen more urgently if symptoms worsen. Pt agreed. Scheduled with Dr Geralyn Knee tomorrow. Asked if patient had tested for covid and she replied "I dont have a test at home and dont feel like going to get one.

## 2023-12-30 ENCOUNTER — Telehealth: Admitting: Internal Medicine

## 2023-12-30 VITALS — BP 107/72 | HR 60 | Temp 98.7°F | Ht 62.0 in | Wt 190.0 lb

## 2023-12-30 DIAGNOSIS — I10 Essential (primary) hypertension: Secondary | ICD-10-CM

## 2023-12-30 DIAGNOSIS — R051 Acute cough: Secondary | ICD-10-CM | POA: Diagnosis not present

## 2023-12-30 MED ORDER — DOXYCYCLINE HYCLATE 100 MG PO TABS: 100.0000 mg | ORAL_TABLET | Freq: Two times a day (BID) | ORAL | 0 refills | Status: DC

## 2023-12-30 NOTE — Progress Notes (Signed)
 [name], female   DOB: Feb 07, 1961, 63 y.o.   MRN: [id number]   Virtual Visit via video Note  I connected with Angie Hartnett by a video enabled telemedicine application and verified that I am speaking with the correct person using two identifiers. Location patient: home Location provider: work  Persons participating in the virtual visit: patient, provider  The limitations, risks, security and privacy concerns of performing an evaluation and management service by video and the availability of in person appointments have been discussed. It has also been discussed with the patient that there may be a patient responsible charge related to this service. The patient expressed understanding and agreed to proceed.   Reason for visit: work in appt  HPI: Work in with fever, cough and congestion. Symptoms started Saturday 12/26/23 - scratchy throat. Increased congestion the following day. Fever. Tmax 12/28/23 - 104. Chills. One episode of emesis after drinking coffee. No further vomiting or diarrhea. Yesterday - increased congestion. Decreased temp 102 yesterday am. Taking tylenol  and aleve. Fever broke last night. Increased head congestion. Left ear issues - left ear clogged after blowing nose. Cough - productive of yellow mucus. No sob. No wheezing. No sore throat. Increased sinus pressure and sinus headache. Taking mucinex max. Discussed flonase and saline nasal spray.    ROS: See pertinent positives and negatives per HPI.  Past Medical History:  Diagnosis Date   Abnormal liver function    Allergy    Anemia    GERD (gastroesophageal reflux disease)    Heart murmur    Hypercholesterolemia    Hyperglycemia    Hypertension     Past Surgical History:  Procedure Laterality Date   ABDOMINAL HYSTERECTOMY  2010   ACHILLES TENDON REPAIR  2009   BREAST BIOPSY Left 07/30/2006   benign   BREAST BIOPSY Left 02/18/2007 & 03/05/2007   benign   BREAST EXCISIONAL BIOPSY     BREAST  SURGERY  2008   LUMBAR MICRODISCECTOMY  2004   ROTATOR CUFF REPAIR  2006   SEPTOPLASTY  1990   TONSILLECTOMY AND ADENOIDECTOMY  1969    Family History  Problem Relation Age of Onset   Hyperlipidemia Mother    Hypertension Mother    Stroke Mother    Heart disease Mother    Hypothyroidism Mother    Pernicious anemia Mother        aunt x 2   Hyperlipidemia Maternal Grandmother    Ulcers Maternal Grandmother    Lung cancer Father        Hx smoker   Hypertension Maternal Grandfather        MI   Heart disease Maternal Grandfather        myocardial infarction   Kidney cancer Paternal Grandmother    Ovarian cancer Maternal Aunt    Breast cancer Paternal Aunt    Diabetes Other    Hypothyroidism Other        aunt x 2   Colon cancer Neg Hx     SOCIAL HX: reviewed.    Current Outpatient Medications:    doxycycline  (VIBRA -TABS) 100 MG tablet, Take 1 tablet (100 mg total) by mouth 2 (two) times daily., Disp: 14 tablet, Rfl: 0   albuterol  (VENTOLIN  HFA) 108 (90 Base) MCG/ACT inhaler, Inhale 2 puffs into the lungs every 6 (six) hours as needed for wheezing or shortness of breath., Disp: 1 each, Rfl: 1   amphetamine-dextroamphetamine (ADDERALL) 15 MG tablet, Take 0.5 tablets by mouth daily., Disp: , Rfl:  atorvastatin  (LIPITOR) 10 MG tablet, Take 1 tablet (10 mg total) by mouth daily., Disp: 90 tablet, Rfl: 3   Biotin 1000 MCG CHEW, Chew by mouth., Disp: , Rfl:    Cholecalciferol (VITAMIN D3) 1000 units CAPS, Take 1 capsule by mouth daily. , Disp: , Rfl:    conjugated estrogens  (PREMARIN ) vaginal cream, Place 1 Applicatorful vaginally daily. Use pea sized amount M-W-Fr before bedtime, Disp: 42.5 g, Rfl: 12   Cyanocobalamin (B-12) 250 MCG TABS, Take 250 mcg by mouth daily., Disp: , Rfl:    fluticasone (FLONASE) 50 MCG/ACT nasal spray, Place 2 sprays into the nose as needed for rhinitis., Disp: , Rfl:    lisinopril  (ZESTRIL ) 20 MG tablet, Take 1 tablet (20 mg total) by mouth daily.,  Disp: 90 tablet, Rfl: 3   loratadine (CLARITIN) 10 MG tablet, Take 10 mg by mouth daily., Disp: , Rfl:    metFORMIN  (GLUCOPHAGE -XR) 500 MG 24 hr tablet, Take 1 tablet (500 mg total) by mouth 2 (two) times daily., Disp: 180 tablet, Rfl: 3   sertraline  (ZOLOFT ) 25 MG tablet, Take 1 tablet (25 mg total) by mouth daily., Disp: 90 tablet, Rfl: 1   vitamin E 400 UNIT capsule, Take 400 Units by mouth daily., Disp: , Rfl:   EXAM:  GENERAL: alert, oriented, appears well and in no acute distress  HEENT: atraumatic, conjunttiva clear, no obvious abnormalities on inspection of external nose and ears  NECK: normal movements of the head and neck  LUNGS: on inspection no signs of respiratory distress, breathing rate appears normal, no obvious gross SOB, gasping or wheezing  CV: no obvious cyanosis  PSYCH/NEURO: pleasant and cooperative, no obvious depression or anxiety, speech and thought processing grossly intact  ASSESSMENT AND PLAN:  Discussed the following assessment and plan:  Problem List Items Addressed This Visit     Cough - Primary   Increased nasal congestion, head congestion, cough - productive or colored mucus. Fever has broken. No sob. Discussed possible etiologies. Day 6 of symptoms. Has been taking mucinex, tylenol  and aleve. Discussed saline nasal spray and steroid nasal spray. Continue mucinex. Concern over bacterial infection. Treat with doxycycline . Stay hydrated. Follow closely. Call with update.       Essential (primary) hypertension   Continue lisinopril . Follow pressures.        Return if symptoms worsen or fail to improve.   I discussed the assessment and treatment plan with the patient. The patient was provided an opportunity to ask questions and all were answered. The patient agreed with the plan and demonstrated an understanding of the instructions.   The patient was advised to call back or seek an in-person evaluation if the symptoms worsen or if the condition  fails to improve as anticipated.    Dellar Fenton, MD

## 2024-01-03 ENCOUNTER — Encounter: Payer: Self-pay | Admitting: Internal Medicine

## 2024-01-03 DIAGNOSIS — R059 Cough, unspecified: Secondary | ICD-10-CM | POA: Insufficient documentation

## 2024-01-03 NOTE — Assessment & Plan Note (Signed)
Continue lisinopril.  Follow pressures.

## 2024-01-03 NOTE — Assessment & Plan Note (Signed)
 Increased nasal congestion, head congestion, cough - productive or colored mucus. Fever has broken. No sob. Discussed possible etiologies. Day 6 of symptoms. Has been taking mucinex, tylenol  and aleve. Discussed saline nasal spray and steroid nasal spray. Continue mucinex. Concern over bacterial infection. Treat with doxycycline . Stay hydrated. Follow closely. Call with update.

## 2024-01-05 MED ORDER — PREMARIN 0.625 MG/GM VA CREA: TOPICAL_CREAM | VAGINAL | 12 refills | Status: DC

## 2024-01-05 MED ORDER — ESTRADIOL 0.1 MG/GM VA CREA: TOPICAL_CREAM | VAGINAL | 12 refills | Status: AC

## 2024-01-05 NOTE — Addendum Note (Signed)
 Addended by: Natha Bair on: 01/05/2024 02:14 PM   Modules accepted: Orders

## 2024-01-05 NOTE — Addendum Note (Signed)
 Addended by: Austin Blumenthal V on: 01/05/2024 05:00 PM   Modules accepted: Orders

## 2024-01-12 ENCOUNTER — Other Ambulatory Visit (INDEPENDENT_AMBULATORY_CARE_PROVIDER_SITE_OTHER)

## 2024-01-12 ENCOUNTER — Encounter: Payer: Self-pay | Admitting: Internal Medicine

## 2024-01-12 DIAGNOSIS — E871 Hypo-osmolality and hyponatremia: Secondary | ICD-10-CM | POA: Diagnosis not present

## 2024-01-12 DIAGNOSIS — E7800 Pure hypercholesterolemia, unspecified: Secondary | ICD-10-CM

## 2024-01-12 DIAGNOSIS — E1165 Type 2 diabetes mellitus with hyperglycemia: Secondary | ICD-10-CM

## 2024-01-12 DIAGNOSIS — I10 Essential (primary) hypertension: Secondary | ICD-10-CM

## 2024-01-12 LAB — SODIUM: Sodium: 137 meq/L (ref 135–145)

## 2024-01-12 NOTE — Addendum Note (Signed)
 Addended by: Thressa Flora D on: 01/12/2024 10:18 AM   Modules accepted: Orders

## 2024-03-28 ENCOUNTER — Other Ambulatory Visit: Payer: Self-pay | Admitting: Internal Medicine

## 2024-04-15 ENCOUNTER — Other Ambulatory Visit: Payer: Self-pay | Admitting: Medical Genetics

## 2024-04-16 ENCOUNTER — Other Ambulatory Visit
Admission: RE | Admit: 2024-04-16 | Discharge: 2024-04-16 | Disposition: A | Discharge status: Home / Self Care | Payer: Self-pay | Source: Ambulatory Visit | Attending: Medical Genetics | Admitting: Medical Genetics

## 2024-04-18 ENCOUNTER — Other Ambulatory Visit (INDEPENDENT_AMBULATORY_CARE_PROVIDER_SITE_OTHER)

## 2024-04-18 DIAGNOSIS — E1165 Type 2 diabetes mellitus with hyperglycemia: Secondary | ICD-10-CM | POA: Diagnosis not present

## 2024-04-18 DIAGNOSIS — E7800 Pure hypercholesterolemia, unspecified: Secondary | ICD-10-CM

## 2024-04-18 DIAGNOSIS — I10 Essential (primary) hypertension: Secondary | ICD-10-CM

## 2024-04-18 DIAGNOSIS — E871 Hypo-osmolality and hyponatremia: Secondary | ICD-10-CM | POA: Diagnosis not present

## 2024-04-18 LAB — BASIC METABOLIC PANEL WITH GFR
BUN: 20 mg/dL (ref 6–23)
CO2: 28 meq/L (ref 19–32)
Calcium: 9.1 mg/dL (ref 8.4–10.5)
Chloride: 103 meq/L (ref 96–112)
Creatinine, Ser: 0.69 mg/dL (ref 0.40–1.20)
GFR: 92.35 mL/min (ref >60.00)
Glucose, Bld: 123 mg/dL — ABNORMAL HIGH (ref 70–99)
Potassium: 4.8 meq/L (ref 3.5–5.1)
Sodium: 139 meq/L (ref 135–145)

## 2024-04-18 LAB — LIPID PANEL
Cholesterol: 168 mg/dL (ref 0–200)
HDL: 44.2 mg/dL (ref >39.00)
LDL Cholesterol: 106 mg/dL — ABNORMAL HIGH (ref 0–99)
NonHDL: 124.06
Total CHOL/HDL Ratio: 4
Triglycerides: 90 mg/dL (ref 0.0–149.0)
VLDL: 18 mg/dL (ref 0.0–40.0)

## 2024-04-18 LAB — HEPATIC FUNCTION PANEL
ALT: 27 U/L (ref 0–35)
AST: 22 U/L (ref 0–37)
Albumin: 4.1 g/dL (ref 3.5–5.2)
Alkaline Phosphatase: 60 U/L (ref 39–117)
Bilirubin, Direct: 0.1 mg/dL (ref 0.0–0.3)
Total Bilirubin: 0.3 mg/dL (ref 0.2–1.2)
Total Protein: 6.6 g/dL (ref 6.0–8.3)

## 2024-04-18 LAB — HEMOGLOBIN A1C: Hgb A1c MFr Bld: 6.2 % (ref 4.6–6.5)

## 2024-04-18 NOTE — Addendum Note (Signed)
 Addended by: TANDA HARVEY D on: 04/18/2024 07:46 AM   Modules accepted: Orders

## 2024-04-19 ENCOUNTER — Ambulatory Visit: Payer: Self-pay | Admitting: Internal Medicine

## 2024-04-20 ENCOUNTER — Ambulatory Visit: Admitting: Internal Medicine

## 2024-04-20 VITALS — BP 122/68 | HR 80 | Temp 97.4°F | Resp 20 | Ht 62.0 in | Wt 194.2 lb

## 2024-04-20 DIAGNOSIS — E7800 Pure hypercholesterolemia, unspecified: Secondary | ICD-10-CM | POA: Diagnosis not present

## 2024-04-20 DIAGNOSIS — F439 Reaction to severe stress, unspecified: Secondary | ICD-10-CM | POA: Diagnosis not present

## 2024-04-20 DIAGNOSIS — N281 Cyst of kidney, acquired: Secondary | ICD-10-CM

## 2024-04-20 DIAGNOSIS — I10 Essential (primary) hypertension: Secondary | ICD-10-CM | POA: Diagnosis not present

## 2024-04-20 DIAGNOSIS — E1165 Type 2 diabetes mellitus with hyperglycemia: Secondary | ICD-10-CM

## 2024-04-20 DIAGNOSIS — Z7984 Long term (current) use of oral hypoglycemic drugs: Secondary | ICD-10-CM

## 2024-04-20 LAB — HM DIABETES FOOT EXAM

## 2024-04-20 MED ORDER — ATORVASTATIN CALCIUM 10 MG PO TABS: ORAL_TABLET | ORAL | 2 refills | Status: AC

## 2024-04-20 MED ORDER — LISINOPRIL 20 MG PO TABS: 20.0000 mg | ORAL_TABLET | Freq: Every day | ORAL | 3 refills | Status: DC

## 2024-04-20 MED ORDER — METFORMIN HCL ER 500 MG PO TB24: 500.0000 mg | ORAL_TABLET | Freq: Two times a day (BID) | ORAL | 3 refills | Status: AC

## 2024-04-20 NOTE — Progress Notes (Signed)
 Subjective:    Patient ID: CHIANN GOFFREDO, female    DOB: [birth year], 63 y.o.   MRN: [id number]  Patient here for  Chief Complaint  Patient presents with   Medical Management of Chronic Issues    4 month follow up    HPI Here for a scheduled follow up - follow up regarding hypercholesterolemia, hypertension and increased stress. Continues on zoloft . Seeing a therapist. Continues on adderall. She is having some issues with sleep. Trouble falling asleep. Taking her adderall in the am. She has discussed with her therapist. They have discussed starting clonidine. Also discussed changing zoloft  to wellbutrin. Discussed obtaining records to review. Continues on metformin . Is having some intermittent episodes of diarrhea. May occur 1-2x/week. Discussed monitoring if possible trigger. Saw urology 12/24/23 - f/u renal cyst. Per urology - no further evaluation or f/u is recommended. Also prescribed estrogen cream and E-Ring - for vaginal dryness.     Past Medical History:  Diagnosis Date   Abnormal liver function    Allergy 64 years old   Anemia    Anxiety    at times I can be very anxious   Chronic kidney disease 5 years ago   cyst in kidney, discovered during ultrasound   Depression I'm not sure, was very young   Diabetes mellitus without complication (HCC) several years ago   discovered during labwork   GERD (gastroesophageal reflux disease)    Heart murmur    Hypercholesterolemia    Hyperglycemia    Hypertension    Past Surgical History:  Procedure Laterality Date   ABDOMINAL HYSTERECTOMY  2010   Partial hysterectomy   ACHILLES TENDON REPAIR  2009   BREAST BIOPSY Left 07/30/2006   benign   BREAST BIOPSY Left 02/18/2007 & 03/05/2007   benign   BREAST EXCISIONAL BIOPSY     BREAST SURGERY  2008   LUMBAR MICRODISCECTOMY  2004   ROTATOR CUFF REPAIR  2006   SEPTOPLASTY  1990   SPINE SURGERY  2004   Microdiscectomy L5-L6   TONSILLECTOMY AND ADENOIDECTOMY  1969   Family History   Problem Relation Age of Onset   Hyperlipidemia Mother    Hypertension Mother    Stroke Mother    Heart disease Mother    Hypothyroidism Mother    Pernicious anemia Mother        aunt x 2   Anxiety disorder Mother    Depression Mother    Hearing loss Mother    Hyperlipidemia Maternal Grandmother    Ulcers Maternal Grandmother    Arthritis Maternal Grandmother    Heart disease Maternal Grandmother    Hypertension Maternal Grandmother    Lung cancer Father        Hx smoker   Cancer Father    Hypertension Maternal Grandfather        MI   Heart disease Maternal Grandfather        myocardial infarction   Kidney cancer Paternal Grandmother    Ovarian cancer Maternal Aunt    Breast cancer Paternal Aunt    Diabetes Other    Hypothyroidism Other        aunt x 2   Diabetes Paternal Grandfather    Cancer Maternal Aunt    Depression Maternal Aunt    Cancer Maternal Aunt    Cancer Paternal Aunt    Diabetes Maternal Uncle    Diabetes Maternal Uncle    Kidney disease Maternal Uncle    Diabetes Paternal Aunt    Varicose  Veins Paternal Aunt    Heart disease Maternal Aunt    Miscarriages / Stillbirths Maternal Aunt    Cancer Maternal Aunt    Heart disease Maternal Aunt    Heart disease Maternal Aunt    Learning disabilities Maternal Aunt    Stroke Maternal Uncle    Colon cancer Neg Hx    Social History   Socioeconomic History   Marital status: Single    Spouse name: Not on file   Number of children: 0   Years of education: Not on file   Highest education level: Associate degree: occupational, Scientist, product/process development, or vocational program  Occupational History   Not on file  Tobacco Use   Smoking status: Never   Smokeless tobacco: Never  Vaping Use   Vaping status: Never Used  Substance and Sexual Activity   Alcohol use: Not Currently    Comment: rare   Drug use: No   Sexual activity: Not on file  Other Topics Concern   Not on file  Social History Narrative   She is single and  has no children. 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    Social Drivers of Corporate investment banker Strain: Low Risk  (04/20/2024)   Overall Financial Resource Strain (CARDIA)    Difficulty of Paying Living Expenses: Not hard at all  Food Insecurity: No Food Insecurity (04/20/2024)   Hunger Vital Sign    Worried About Running Out of Food in the Last Year: Never true    Ran Out of Food in the Last Year: Never true  Transportation Needs: No Transportation Needs (04/20/2024)   PRAPARE - Administrator, Civil Service (Medical): No    Lack of Transportation (Non-Medical): No  Physical Activity: Inactive (04/20/2024)   Exercise Vital Sign    Days of Exercise per Week: 0 days    Minutes of Exercise per Session: Not on file  Stress: Stress Concern Present (04/20/2024)   [employer] of Occupational Health - Occupational Stress Questionnaire    Feeling of Stress: To some extent  Social Connections: Moderately Integrated (04/20/2024)   Social Connection and Isolation Panel    Frequency of Communication with Friends and Family: More than three times a week    Frequency of Social Gatherings with Friends and Family: Twice a week    Attends Religious Services: More than 4 times per year    Active Member of [employer] or Organizations: Yes    Attends Engineer, structural: More than 4 times per year    Marital Status: Never married     Review of Systems  Constitutional:  Negative for appetite change and unexpected weight change.  HENT:  Negative for congestion and sinus pressure.   Respiratory:  Negative for cough, chest tightness and shortness of breath.   Cardiovascular:  Negative for chest pain, palpitations and leg swelling.  Gastrointestinal:  Negative for nausea and vomiting.       Intermittent loose stools as outlined.   Genitourinary:  Negative for difficulty urinating and dysuria.  Musculoskeletal:  Negative for joint swelling and myalgias.   Skin:  Negative for color change and rash.  Neurological:  Negative for dizziness and headaches.  Psychiatric/Behavioral:  Positive for sleep disturbance. Negative for agitation.        Objective:     BP 122/68   Pulse 80   Temp (!) 97.4 F (36.3 C)   Resp 20   Ht 5' 2 (1.575 m)  Wt 194 lb 4 oz (88.1 kg)   SpO2 97%   BMI 35.53 kg/m  Wt Readings from Last 3 Encounters:  04/20/24 194 lb 4 oz (88.1 kg)  12/30/23 190 lb (86.2 kg)  12/24/23 194 lb (88 kg)    Physical Exam Vitals reviewed.  Constitutional:      General: She is not in acute distress.    Appearance: Normal appearance.  HENT:     Head: Normocephalic and atraumatic.     Right Ear: External ear normal.     Left Ear: External ear normal.     Mouth/Throat:     Pharynx: No oropharyngeal exudate or posterior oropharyngeal erythema.  Eyes:     General: No scleral icterus.       Right eye: No discharge.        Left eye: No discharge.     Conjunctiva/sclera: Conjunctivae normal.  Neck:     Thyroid : No thyromegaly.  Cardiovascular:     Rate and Rhythm: Normal rate and regular rhythm.  Pulmonary:     Effort: No respiratory distress.     Breath sounds: Normal breath sounds. No wheezing.  Abdominal:     General: Bowel sounds are normal.     Palpations: Abdomen is soft.     Tenderness: There is no abdominal tenderness.  Musculoskeletal:        General: No swelling or tenderness.     Cervical back: Neck supple. No tenderness.  Lymphadenopathy:     Cervical: No cervical adenopathy.  Skin:    Findings: No erythema or rash.  Neurological:     Mental Status: She is alert.  Psychiatric:        Mood and Affect: Mood normal.        Behavior: Behavior normal.      Diabetic foot exam was performed with the following findings:   No deformities, ulcerations, or other skin breakdown Normal sensation of 10g monofilament Intact posterior tibialis and dorsalis pedis pulses      Outpatient Encounter Medications  as of 04/20/2024  Medication Sig   albuterol  (VENTOLIN  HFA) 108 (90 Base) MCG/ACT inhaler Inhale 2 puffs into the lungs every 6 (six) hours as needed for wheezing or shortness of breath.   amphetamine-dextroamphetamine (ADDERALL) 15 MG tablet Take 0.5 tablets by mouth daily.   Biotin 1000 MCG CHEW Chew by mouth.   Cholecalciferol (VITAMIN D3) 1000 units CAPS Take 1 capsule by mouth daily.    Cyanocobalamin (B-12) 250 MCG TABS Take 250 mcg by mouth daily.   estradiol  (ESTRACE ) 0.1 MG/GM vaginal cream Estrogen Cream Instruction Discard applicator Apply pea sized amount to tip of finger to urethra before bed. Wash hands well after application. Use Monday, Wednesday and Friday   fluticasone (FLONASE) 50 MCG/ACT nasal spray Place 2 sprays into the nose as needed for rhinitis.   loratadine (CLARITIN) 10 MG tablet Take 10 mg by mouth daily.   sertraline  (ZOLOFT ) 25 MG tablet Take 1 tablet (25 mg total) by mouth daily.   vitamin E 400 UNIT capsule Take 400 Units by mouth daily.   [DISCONTINUED] atorvastatin  (LIPITOR) 10 MG tablet Take 1 tablet (10 mg total) by mouth daily.   [DISCONTINUED] lisinopril  (ZESTRIL ) 20 MG tablet Take 1 tablet (20 mg total) by mouth daily.   [DISCONTINUED] metFORMIN  (GLUCOPHAGE -XR) 500 MG 24 hr tablet Take 1 tablet (500 mg total) by mouth 2 (two) times daily.   atorvastatin  (LIPITOR) 10 MG tablet Take one tablet 5 days per week.  lisinopril  (ZESTRIL ) 20 MG tablet Take 1 tablet (20 mg total) by mouth daily.   metFORMIN  (GLUCOPHAGE -XR) 500 MG 24 hr tablet Take 1 tablet (500 mg total) by mouth 2 (two) times daily.   [DISCONTINUED] doxycycline  (VIBRA -TABS) 100 MG tablet Take 1 tablet (100 mg total) by mouth 2 (two) times daily.   No facility-administered encounter medications on file as of 04/20/2024.     Lab Results  Component Value Date   WBC 4.8 12/10/2023   HGB 13.9 12/10/2023   HCT 41.6 12/10/2023   PLT 236.0 12/10/2023   GLUCOSE 123 (H) 04/18/2024   CHOL 168 04/18/2024    TRIG 90.0 04/18/2024   HDL 44.20 04/18/2024   LDLDIRECT 126.0 05/02/2022   LDLCALC 106 (H) 04/18/2024   ALT 27 04/18/2024   AST 22 04/18/2024   NA 139 04/18/2024   K 4.8 04/18/2024   CL 103 04/18/2024   CREATININE 0.69 04/18/2024   BUN 20 04/18/2024   CO2 28 04/18/2024   TSH 1.56 12/10/2023   INR 1.0 07/13/2017   HGBA1C 6.2 04/18/2024       Assessment & Plan:  Essential (primary) hypertension Assessment & Plan: Conitnue lisinopril . Follow pressures.  Follow metabolic panel.    Pure hypercholesterolemia Assessment & Plan: On lipitor. Currently taking 5 days per week.  Tolerating ok.  Low cholesterol diet and exercise.  Follow lipid panel and liver function tests. Hold on making changes in the dose.   The 10-year ASCVD risk score (Arnett DK, et al., 2019) is: 10.4%   Values used to calculate the score:     Age: 41 years     Clincally relevant sex: Female     Is Non-Hispanic African American: No     Diabetic: Yes     Tobacco smoker: No     Systolic Blood Pressure: 122 mmHg     Is BP treated: Yes     HDL Cholesterol: 44.2 mg/dL     Total Cholesterol: 168 mg/dL    Renal cyst Assessment & Plan: Saw urology (Dr Penne) 06/15/20.  Note reviewed.  Recommended f/u renal ultrasound in 2 years.  Had abdominal ultrasound 2022 - stable cyst.  Had f/u renal ultrasound 06/2023 - septted cyst. Had f/u with Dr  Penne 12/2023 - no further w/up warranted.    Stress Assessment & Plan: Going to therapy.  S/p ADHD testing. On adderall.  Some issues with sleep. Seeing her therapist /psychiatrist. Obtain records. Discussing medication changes.    Type 2 diabetes mellitus with hyperglycemia, without long-term current use of insulin (HCC) Assessment & Plan:  On metformin .  Continue low carb diet and exercise.  Follow met b and A1c.  Lab Results  Component Value Date   HGBA1C 6.2 04/18/2024      Other orders -     Atorvastatin  Calcium ; Take one tablet 5 days per week.  Dispense:  65 tablet; Refill: 2 -     Lisinopril ; Take 1 tablet (20 mg total) by mouth daily.  Dispense: 90 tablet; Refill: 3 -     metFORMIN  HCl ER; Take 1 tablet (500 mg total) by mouth 2 (two) times daily.  Dispense: 180 tablet; Refill: 3     Allena Hamilton, MD

## 2024-04-20 NOTE — Assessment & Plan Note (Addendum)
 On lipitor. Currently taking 5 days per week.  Tolerating ok.  Low cholesterol diet and exercise.  Follow lipid panel and liver function tests. Hold on making changes in the dose.   The 10-year ASCVD risk score (Arnett DK, et al., 2019) is: 10.4%   Values used to calculate the score:     Age: 63 years     Clincally relevant sex: Female     Is Non-Hispanic African American: No     Diabetic: Yes     Tobacco smoker: No     Systolic Blood Pressure: 122 mmHg     Is BP treated: Yes     HDL Cholesterol: 44.2 mg/dL     Total Cholesterol: 168 mg/dL

## 2024-04-24 ENCOUNTER — Encounter: Payer: Self-pay | Admitting: Internal Medicine

## 2024-04-24 NOTE — Assessment & Plan Note (Signed)
 Saw urology (Dr Penne) 06/15/20.  Note reviewed.  Recommended f/u renal ultrasound in 2 years.  Had abdominal ultrasound 2022 - stable cyst.  Had f/u renal ultrasound 06/2023 - septted cyst. Had f/u with Dr  Penne 12/2023 - no further w/up warranted.

## 2024-04-24 NOTE — Assessment & Plan Note (Signed)
 Conitnue lisinopril . Follow pressures.  Follow metabolic panel.

## 2024-04-24 NOTE — Assessment & Plan Note (Signed)
 Going to therapy.  S/p ADHD testing. On adderall.  Some issues with sleep. Seeing her therapist /psychiatrist. Obtain records. Discussing medication changes.

## 2024-04-24 NOTE — Assessment & Plan Note (Signed)
 On metformin .  Continue low carb diet and exercise.  Follow met b and A1c.  Lab Results  Component Value Date   HGBA1C 6.2 04/18/2024

## 2024-04-26 ENCOUNTER — Ambulatory Visit: Admitting: Family

## 2024-04-26 ENCOUNTER — Encounter: Payer: Self-pay | Admitting: Family

## 2024-04-26 VITALS — BP 130/70 | HR 68 | Temp 98.2°F | Ht 62.0 in | Wt 193.6 lb

## 2024-04-26 DIAGNOSIS — N6310 Unspecified lump in the right breast, unspecified quadrant: Secondary | ICD-10-CM | POA: Insufficient documentation

## 2024-04-26 DIAGNOSIS — F339 Major depressive disorder, recurrent, unspecified: Secondary | ICD-10-CM | POA: Diagnosis not present

## 2024-04-26 DIAGNOSIS — N6314 Unspecified lump in the right breast, lower inner quadrant: Secondary | ICD-10-CM

## 2024-04-26 LAB — GENECONNECT MOLECULAR SCREEN: Genetic Analysis Overall Interpretation: NEGATIVE

## 2024-04-26 NOTE — Progress Notes (Signed)
 Assessment & Plan:  Mass of lower inner quadrant of right breast Assessment & Plan: Mass appreciated on exam. Order a diagnostic mammogram and ultrasound of the right breast  Orders: -     MM 3D DIAGNOSTIC MAMMOGRAM UNILATERAL RIGHT BREAST; Future -     US  LIMITED ULTRASOUND INCLUDING AXILLA RIGHT BREAST; Future  Depression, recurrent (HCC) Assessment & Plan: Chronic, suboptimal control due to ongoing stress with family. She endorses occcasional passive suicidal thoughts for years; she is adamant that she has no active suicidal ideation. Continue Zoloft  25 mg daily and Adderall for ADHD. She has follow up with a psychiatrist at the end of August for a medication review and is comfortable with waiting to see psychiatry. She declines increase of zoloft  25mg  at this this time.         Return precautions given.   Risks, benefits, and alternatives of the medications and treatment plan prescribed today were discussed, and patient expressed understanding.   Education regarding symptom management and diagnosis given to patient on AVS either electronically or printed.  No follow-ups on file.  Rollene Northern, FNP  Subjective:    [name], female    DOB: May 18, 1961, 63 y.o.   MRN: [id number]  CC: Carly Fields is a 63 y.o. female who presents today for an acute visit.    HPI: HPI Discussed the use of AI scribe software for clinical note transcription with the patient, who gave verbal consent to proceed.  History of Present Illness   Carly Fields is a 63 year old female who presents with right breast soreness.  She noticed right breast soreness four days ago while in the shower. The area is tender, but the soreness has decreased since its onset.  She is currently taking Zoloft  25 mg for depression and anxiety, which she started last year, and Adderall for ADHD. She experiences occasional thoughts of self-harm but states she would not act on them due to  personal beliefs and concerns about the consequences. She has a strong faith.  She denies prior suicide attempt. She denies suicidal plan.  She attributes some of her emotional struggles to her childhood experiences, including a lack of trust stemming from family secrets. She has been diagnosed with autism and ADHD, which she believes contribute to her emotional challenges.   She sees a therapist, Mliss Fischer, every other week and has been in contact with a psychiatrist for medication management.        H/o HTN, DM Mammogram 07/09/23 BI rads 1 negative H/o breast biopsy left 2008 Seen 2 days ago by PCP for HTN and stress She is seeing therapist and psychiatrist, Dolapo, NP for adderall prescription.   NO FDR with breast cancer.   Allergies: Penicillins and Tetanus toxoids Current Outpatient Medications on File Prior to Visit  Medication Sig Dispense Refill   albuterol  (VENTOLIN  HFA) 108 (90 Base) MCG/ACT inhaler Inhale 2 puffs into the lungs every 6 (six) hours as needed for wheezing or shortness of breath. 1 each 1   amphetamine-dextroamphetamine (ADDERALL) 15 MG tablet Take 0.5 tablets by mouth daily.     atorvastatin  (LIPITOR) 10 MG tablet Take one tablet 5 days per week. 65 tablet 2   Biotin 1000 MCG CHEW Chew by mouth.     Cholecalciferol (VITAMIN D3) 1000 units CAPS Take 1 capsule by mouth daily.      Cyanocobalamin (B-12) 250 MCG TABS Take 250 mcg by mouth daily.  estradiol  (ESTRACE ) 0.1 MG/GM vaginal cream Estrogen Cream Instruction Discard applicator Apply pea sized amount to tip of finger to urethra before bed. Wash hands well after application. Use Monday, Wednesday and Friday 42.5 g 12   fluticasone (FLONASE) 50 MCG/ACT nasal spray Place 2 sprays into the nose as needed for rhinitis.     lisinopril  (ZESTRIL ) 20 MG tablet Take 1 tablet (20 mg total) by mouth daily. 90 tablet 3   loratadine (CLARITIN) 10 MG tablet Take 10 mg by mouth daily.     metFORMIN   (GLUCOPHAGE -XR) 500 MG 24 hr tablet Take 1 tablet (500 mg total) by mouth 2 (two) times daily. 180 tablet 3   sertraline  (ZOLOFT ) 25 MG tablet Take 1 tablet (25 mg total) by mouth daily. 90 tablet 1   vitamin E 400 UNIT capsule Take 400 Units by mouth daily.     No current facility-administered medications on file prior to visit.    Review of Systems    Objective:    BP 130/70   Pulse 68   Temp 98.2 F (36.8 C) (Oral)   Ht 5' 2 (1.575 m)   Wt 193 lb 9.6 oz (87.8 kg)   SpO2 97%   BMI 35.41 kg/m   BP Readings from Last 3 Encounters:  04/26/24 130/70  04/20/24 122/68  12/30/23 107/72   Wt Readings from Last 3 Encounters:  04/26/24 193 lb 9.6 oz (87.8 kg)  04/20/24 194 lb 4 oz (88.1 kg)  12/30/23 190 lb (86.2 kg)      04/26/2024   10:38 AM 04/26/2024   10:18 AM 04/20/2024    8:53 AM  Depression screen PHQ 2/9  Decreased Interest 1 0 1  Down, Depressed, Hopeless 1 0 1  PHQ - 2 Score 2 0 2  Altered sleeping 1 0 1  Tired, decreased energy 1 0 1  Change in appetite 0 0 0  Feeling bad or failure about yourself  0 0 1  Trouble concentrating 1 0 0  Moving slowly or fidgety/restless 0 0 0  Suicidal thoughts 1 0 1  PHQ-9 Score 6 0 6  Difficult doing work/chores Not difficult at all Not difficult at all Not difficult at all      04/26/2024   10:39 AM 04/20/2024    8:54 AM 10/26/2023   11:04 AM 09/19/2022    4:20 PM  GAD 7 : Generalized Anxiety Score  Nervous, Anxious, on Edge 1 1 0 3  Control/stop worrying 1 0 0 3  Worry too much - different things 0 0 0 2  Trouble relaxing 0 0 0 2  Restless 0 0 0 0  Easily annoyed or irritable 0 0 0 2  Afraid - awful might happen 0 0 0 0  Total GAD 7 Score 2 1 0 12  Anxiety Difficulty Not difficult at all Not difficult at all Not difficult at all Somewhat difficult      Physical Exam Vitals reviewed.  Constitutional:      Appearance: She is well-developed.  Eyes:     Conjunctiva/sclera: Conjunctivae normal.  Cardiovascular:      Rate and Rhythm: Normal rate and regular rhythm.     Pulses: Normal pulses.     Heart sounds: Normal heart sounds.  Pulmonary:     Effort: Pulmonary effort is normal.     Breath sounds: Normal breath sounds. No wheezing, rhonchi or rales.  Chest:  Breasts:    Right: Tenderness present. No swelling, inverted nipple or nipple discharge.  Left: No swelling, inverted nipple, nipple discharge or tenderness.       Comments: right 4 oclock circumscribed mass, slightly tender No erythema, purulent discharge.  Area is nonfluctuant.  No red streaks Skin:    General: Skin is warm and dry.  Neurological:     Mental Status: She is alert.  Psychiatric:        Speech: Speech normal.        Behavior: Behavior normal.        Thought Content: Thought content normal.

## 2024-04-26 NOTE — Assessment & Plan Note (Addendum)
 Chronic, suboptimal control due to ongoing stress with family. She endorses occcasional passive suicidal thoughts for years; she is adamant that she has no active suicidal ideation. Continue Zoloft  25 mg daily and Adderall for ADHD. She has follow up with a psychiatrist at the end of August for a medication review and is comfortable with waiting to see psychiatry. She declines increase of zoloft  25mg  at this this time.

## 2024-04-26 NOTE — Assessment & Plan Note (Signed)
 Mass appreciated on exam. Order a diagnostic mammogram and ultrasound of the right breast

## 2024-04-28 ENCOUNTER — Ambulatory Visit
Admission: RE | Admit: 2024-04-28 | Discharge: 2024-04-28 | Disposition: A | Discharge status: Home / Self Care | Source: Ambulatory Visit | Attending: Family | Admitting: Family

## 2024-04-28 DIAGNOSIS — N6314 Unspecified lump in the right breast, lower inner quadrant: Secondary | ICD-10-CM | POA: Diagnosis present

## 2024-05-24 ENCOUNTER — Encounter: Payer: Self-pay | Admitting: Internal Medicine

## 2024-05-24 NOTE — Telephone Encounter (Signed)
 She is scheduled for a physical in 06/2024. She just had labs in 04/2024. It will be too soon to do labs prior to her 06/2024 appt. Regarding clonidine, it can effect blood pressure. We would need to follow pressures and adjust medication if needed. Please request copy of office notes for me to review.

## 2024-05-26 NOTE — Telephone Encounter (Signed)
 Medical release faxed over to [phone]

## 2024-06-22 ENCOUNTER — Ambulatory Visit (INDEPENDENT_AMBULATORY_CARE_PROVIDER_SITE_OTHER): Admitting: Internal Medicine

## 2024-06-22 ENCOUNTER — Encounter: Payer: Self-pay | Admitting: Internal Medicine

## 2024-06-22 VITALS — BP 126/78 | HR 74 | Temp 98.7°F | Ht 62.0 in | Wt 194.8 lb

## 2024-06-22 DIAGNOSIS — Z0000 Encounter for general adult medical examination without abnormal findings: Secondary | ICD-10-CM

## 2024-06-22 DIAGNOSIS — T63424D Toxic effect of venom of ants, undetermined, subsequent encounter: Secondary | ICD-10-CM

## 2024-06-22 DIAGNOSIS — R928 Other abnormal and inconclusive findings on diagnostic imaging of breast: Secondary | ICD-10-CM

## 2024-06-22 DIAGNOSIS — E1165 Type 2 diabetes mellitus with hyperglycemia: Secondary | ICD-10-CM | POA: Diagnosis not present

## 2024-06-22 DIAGNOSIS — E871 Hypo-osmolality and hyponatremia: Secondary | ICD-10-CM

## 2024-06-22 DIAGNOSIS — D649 Anemia, unspecified: Secondary | ICD-10-CM

## 2024-06-22 DIAGNOSIS — I10 Essential (primary) hypertension: Secondary | ICD-10-CM | POA: Diagnosis not present

## 2024-06-22 DIAGNOSIS — N281 Cyst of kidney, acquired: Secondary | ICD-10-CM

## 2024-06-22 DIAGNOSIS — Z0001 Encounter for general adult medical examination with abnormal findings: Secondary | ICD-10-CM | POA: Diagnosis not present

## 2024-06-22 DIAGNOSIS — E7800 Pure hypercholesterolemia, unspecified: Secondary | ICD-10-CM

## 2024-06-22 DIAGNOSIS — Z7984 Long term (current) use of oral hypoglycemic drugs: Secondary | ICD-10-CM

## 2024-06-22 DIAGNOSIS — E559 Vitamin D deficiency, unspecified: Secondary | ICD-10-CM

## 2024-06-22 MED ORDER — SERTRALINE HCL 25 MG PO TABS: 25.0000 mg | ORAL_TABLET | Freq: Every day | ORAL | 1 refills | Status: AC

## 2024-06-22 MED ORDER — TRIAMCINOLONE ACETONIDE 0.1 % EX CREA: 1.0000 | TOPICAL_CREAM | Freq: Two times a day (BID) | CUTANEOUS | 0 refills | Status: AC

## 2024-06-22 NOTE — Progress Notes (Addendum)
 Subjective:    [name], female    DOB: 14-Mar-1961, 62 y.o.   MRN: [id number]  Patient here for  Chief Complaint  Patient presents with   Annual Exam    HPI Here for a physical exam. Continues on zoloft . Seeing a therapist. Continues on adderall. Increased stress. Uncle passed. She is having to make decisions and handling arrangements for his funeral, etc. Overall appears to be handling things relatively well. Saw urology 12/24/23 - f/u renal cyst. Per urology - no further evaluation or f/u is recommended. Also prescribed estrogen cream and E-Ring - for vaginal dryness. Overall doing ok. Breathing stable. She did get bit - both feet - fire ants. Increased itching.    Past Medical History:  Diagnosis Date   Abnormal liver function    Allergy 63 years old   Anemia    Anxiety    at times I can be very anxious   Chronic kidney disease 5 years ago   cyst in kidney, discovered during ultrasound   Depression I'm not sure, was very young   Diabetes mellitus without complication (HCC) several years ago   discovered during labwork   GERD (gastroesophageal reflux disease)    Heart murmur    Hypercholesterolemia    Hyperglycemia    Hypertension    Past Surgical History:  Procedure Laterality Date   ABDOMINAL HYSTERECTOMY  2010   Partial hysterectomy   ACHILLES TENDON REPAIR  2009   BREAST BIOPSY Left 07/30/2006   benign   BREAST BIOPSY Left 02/18/2007 & 03/05/2007   benign   BREAST EXCISIONAL BIOPSY     BREAST SURGERY  2008   LUMBAR MICRODISCECTOMY  2004   ROTATOR CUFF REPAIR  2006   SEPTOPLASTY  1990   SPINE SURGERY  2004   Microdiscectomy L5-L6   TONSILLECTOMY AND ADENOIDECTOMY  1969   Family History  Problem Relation Age of Onset   Hyperlipidemia Mother    Hypertension Mother    Stroke Mother    Heart disease Mother    Hypothyroidism Mother    Pernicious anemia Mother        aunt x 2   Anxiety disorder Mother    Depression Mother    Hearing loss  Mother    Lung cancer Father        Hx smoker   Cancer Father    Ovarian cancer Maternal Aunt    Cancer Maternal Aunt    Depression Maternal Aunt    Cancer Maternal Aunt    Heart disease Maternal Aunt    Miscarriages / Stillbirths Maternal Aunt    Cancer Maternal Aunt    Heart disease Maternal Aunt    Heart disease Maternal Aunt    Hearing loss Maternal Aunt    Learning disabilities Maternal Aunt    Diabetes Maternal Uncle    COPD Maternal Uncle    Diabetes Maternal Uncle    Kidney disease Maternal Uncle    Stroke Maternal Uncle    Heart disease Maternal Uncle    Breast cancer Paternal Aunt    Cancer Paternal Aunt    Diabetes Paternal Aunt    Varicose Veins Paternal Aunt    Hyperlipidemia Maternal Grandmother    Ulcers Maternal Grandmother    Arthritis Maternal Grandmother    Heart disease Maternal Grandmother    Hypertension Maternal Grandmother    Hypertension Maternal Grandfather        MI   Heart disease Maternal Grandfather  myocardial infarction   Kidney cancer Paternal Grandmother    Diabetes Paternal Grandfather    Breast cancer Cousin    Diabetes Other    Hypothyroidism Other        aunt x 2   Colon cancer Neg Hx    Social History   Socioeconomic History   Marital status: Single    Spouse name: Not on file   Number of children: 0   Years of education: Not on file   Highest education level: Associate degree: occupational, Scientist, product/process development, or vocational program  Occupational History   Not on file  Tobacco Use   Smoking status: Never   Smokeless tobacco: Never  Vaping Use   Vaping status: Never Used  Substance and Sexual Activity   Alcohol use: Not Currently    Comment: rare   Drug use: Never   Sexual activity: Not Currently    Birth control/protection: Abstinence  Other Topics Concern   Not on file  Social History Narrative   She is single and has no children. 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    Social  Drivers of Corporate investment banker Strain: Low Risk  (04/20/2024)   Overall Financial Resource Strain (CARDIA)    Difficulty of Paying Living Expenses: Not hard at all  Food Insecurity: No Food Insecurity (04/20/2024)   Hunger Vital Sign    Worried About Running Out of Food in the Last Year: Never true    Ran Out of Food in the Last Year: Never true  Transportation Needs: No Transportation Needs (04/20/2024)   PRAPARE - Administrator, Civil Service (Medical): No    Lack of Transportation (Non-Medical): No  Physical Activity: Inactive (04/20/2024)   Exercise Vital Sign    Days of Exercise per Week: 0 days    Minutes of Exercise per Session: Not on file  Stress: Stress Concern Present (04/20/2024)   [employer] of Occupational Health - Occupational Stress Questionnaire    Feeling of Stress: To some extent  Social Connections: Moderately Integrated (04/20/2024)   Social Connection and Isolation Panel    Frequency of Communication with Friends and Family: More than three times a week    Frequency of Social Gatherings with Friends and Family: Twice a week    Attends Religious Services: More than 4 times per year    Active Member of [employer] or Organizations: Yes    Attends Engineer, structural: More than 4 times per year    Marital Status: Never married     Review of Systems  Constitutional:  Negative for appetite change and unexpected weight change.  HENT:  Negative for congestion, sinus pressure and sore throat.   Eyes:  Negative for pain and visual disturbance.  Respiratory:  Negative for cough, chest tightness and shortness of breath.   Cardiovascular:  Negative for chest pain, palpitations and leg swelling.  Gastrointestinal:  Negative for abdominal pain, diarrhea, nausea and vomiting.  Genitourinary:  Negative for difficulty urinating and dysuria.  Musculoskeletal:  Negative for joint swelling and myalgias.  Skin:  Negative for color change and rash.   Neurological:  Negative for dizziness and headaches.  Hematological:  Negative for adenopathy. Does not bruise/bleed easily.  Psychiatric/Behavioral:  Negative for agitation and dysphoric mood.        Increased stress as outlined.        Objective:     BP 126/78   Pulse 74   Temp 98.7 F (37.1  C) (Oral)   Ht 5' 2 (1.575 m)   Wt 194 lb 12.8 oz (88.4 kg)   SpO2 96%   BMI 35.63 kg/m  Wt Readings from Last 3 Encounters:  06/22/24 194 lb 12.8 oz (88.4 kg)  04/26/24 193 lb 9.6 oz (87.8 kg)  04/20/24 194 lb 4 oz (88.1 kg)    Physical Exam Vitals reviewed.  Constitutional:      General: She is not in acute distress.    Appearance: Normal appearance. She is well-developed.  HENT:     Head: Normocephalic and atraumatic.     Right Ear: External ear normal.     Left Ear: External ear normal.     Mouth/Throat:     Pharynx: No oropharyngeal exudate or posterior oropharyngeal erythema.  Eyes:     General: No scleral icterus.       Right eye: No discharge.        Left eye: No discharge.     Conjunctiva/sclera: Conjunctivae normal.  Neck:     Thyroid : No thyromegaly.  Cardiovascular:     Rate and Rhythm: Normal rate and regular rhythm.  Pulmonary:     Effort: No tachypnea, accessory muscle usage or respiratory distress.     Breath sounds: Normal breath sounds. No decreased breath sounds or wheezing.  Chest:  Breasts:    Right: No inverted nipple, mass, nipple discharge or tenderness (no axillary adenopathy).     Left: No inverted nipple, mass, nipple discharge or tenderness (no axilarry adenopathy).  Abdominal:     General: Bowel sounds are normal.     Palpations: Abdomen is soft.     Tenderness: There is no abdominal tenderness.  Musculoskeletal:        General: No swelling or tenderness.     Cervical back: Neck supple.  Lymphadenopathy:     Cervical: No cervical adenopathy.  Skin:    Findings: No erythema.     Comments: Erythematous lesion - both feet.    Neurological:     Mental Status: She is alert and oriented to person, place, and time.  Psychiatric:        Mood and Affect: Mood normal.        Behavior: Behavior normal.         Outpatient Encounter Medications as of 06/22/2024  Medication Sig   albuterol  (VENTOLIN  HFA) 108 (90 Base) MCG/ACT inhaler Inhale 2 puffs into the lungs every 6 (six) hours as needed for wheezing or shortness of breath.   amphetamine-dextroamphetamine (ADDERALL) 10 MG tablet Take 10 mg by mouth daily.   atorvastatin  (LIPITOR) 10 MG tablet Take one tablet 5 days per week.   Biotin 1000 MCG CHEW Chew by mouth.   Cholecalciferol (VITAMIN D3) 1000 units CAPS Take 1 capsule by mouth daily.    cloNIDine (CATAPRES) 0.1 MG tablet Take by mouth daily.   Cyanocobalamin (B-12) 250 MCG TABS Take 250 mcg by mouth daily.   estradiol  (ESTRACE ) 0.1 MG/GM vaginal cream Estrogen Cream Instruction Discard applicator Apply pea sized amount to tip of finger to urethra before bed. Wash hands well after application. Use Monday, Wednesday and Friday   fluticasone (FLONASE) 50 MCG/ACT nasal spray Place 2 sprays into the nose as needed for rhinitis.   lisinopril  (ZESTRIL ) 20 MG tablet Take 1 tablet (20 mg total) by mouth daily.   loratadine (CLARITIN) 10 MG tablet Take 10 mg by mouth daily.   metFORMIN  (GLUCOPHAGE -XR) 500 MG 24 hr tablet Take 1 tablet (500 mg total) by  mouth 2 (two) times daily.   triamcinolone cream (KENALOG) 0.1 % Apply 1 Application topically 2 (two) times daily.   vitamin E 400 UNIT capsule Take 400 Units by mouth daily.   [DISCONTINUED] sertraline  (ZOLOFT ) 25 MG tablet Take 1 tablet (25 mg total) by mouth daily.   sertraline  (ZOLOFT ) 25 MG tablet Take 1 tablet (25 mg total) by mouth daily.   [DISCONTINUED] amphetamine-dextroamphetamine (ADDERALL) 15 MG tablet Take 0.5 tablets by mouth daily.   No facility-administered encounter medications on file as of 06/22/2024.     Lab Results  Component Value Date   WBC  4.8 12/10/2023   HGB 13.9 12/10/2023   HCT 41.6 12/10/2023   PLT 236.0 12/10/2023   GLUCOSE 123 (H) 04/18/2024   CHOL 168 04/18/2024   TRIG 90.0 04/18/2024   HDL 44.20 04/18/2024   LDLDIRECT 126.0 05/02/2022   LDLCALC 106 (H) 04/18/2024   ALT 27 04/18/2024   AST 22 04/18/2024   NA 139 04/18/2024   K 4.8 04/18/2024   CL 103 04/18/2024   CREATININE 0.69 04/18/2024   BUN 20 04/18/2024   CO2 28 04/18/2024   TSH 1.56 12/10/2023   INR 1.0 07/13/2017   HGBA1C 6.2 04/18/2024    MM 3D DIAGNOSTIC MAMMOGRAM BILATERAL BREAST Result Date: 04/28/2024 CLINICAL DATA:  RIGHT breast lump and tenderness since Saturday. Due for annual. EXAM: DIGITAL DIAGNOSTIC BILATERAL MAMMOGRAM WITH TOMOSYNTHESIS AND CAD; ULTRASOUND RIGHT BREAST LIMITED TECHNIQUE: Bilateral digital diagnostic mammography and breast tomosynthesis was performed. The images were evaluated with computer-aided detection. ; Targeted ultrasound examination of the right breast was performed COMPARISON:  Previous exam(s). ACR Breast Density Category b: There are scattered areas of fibroglandular density. FINDINGS: Spot compression tomosynthesis views were obtained over the palpable area of concern in the MEDIAL RIGHT breast. The area of palpable concern correlates with a site of remote prior fat necrosis with calcified oil cysts, with new underlying skin thickening and focal asymmetry containing interspersed fat. No suspicious mass, microcalcification, or other finding is identified in the LEFT breast. Targeted RIGHT breast ultrasound was performed in the palpable area of concern at 4 o'clock 6 cm from the nipple. There is mild underlying skin thickening, and heterogeneously echogenic fat, including a few small cystic structures which likely reflect oil cysts. Two large, partially calcified cystic lesions correlate with the stable, chronic oil cysts. IMPRESSION: 1. RIGHT breast palpable abnormality correlates with probably benign suspected acute on  chronic fat necrosis. A three-month follow-up RIGHT breast diagnostic mammogram and ultrasound is recommended to confirm improvement/resolution, given lack of recent trauma history to explain fat necrosis. 2.  No mammographic evidence of malignancy in the LEFT breast. RECOMMENDATION: Three-month follow-up diagnostic mammogram and diagnostic ultrasound of the RIGHT breast I have discussed the findings and recommendations with the patient. If applicable, a reminder letter will be sent to the patient regarding the next appointment. BI-RADS CATEGORY  3: Probably benign. Electronically Signed   By: Norleen Croak M.D.   On: 04/28/2024 10:10   US  LIMITED ULTRASOUND INCLUDING AXILLA RIGHT BREAST Result Date: 04/28/2024 CLINICAL DATA:  RIGHT breast lump and tenderness since Saturday. Due for annual. EXAM: DIGITAL DIAGNOSTIC BILATERAL MAMMOGRAM WITH TOMOSYNTHESIS AND CAD; ULTRASOUND RIGHT BREAST LIMITED TECHNIQUE: Bilateral digital diagnostic mammography and breast tomosynthesis was performed. The images were evaluated with computer-aided detection. ; Targeted ultrasound examination of the right breast was performed COMPARISON:  Previous exam(s). ACR Breast Density Category b: There are scattered areas of fibroglandular density. FINDINGS: Spot compression tomosynthesis views were  obtained over the palpable area of concern in the MEDIAL RIGHT breast. The area of palpable concern correlates with a site of remote prior fat necrosis with calcified oil cysts, with new underlying skin thickening and focal asymmetry containing interspersed fat. No suspicious mass, microcalcification, or other finding is identified in the LEFT breast. Targeted RIGHT breast ultrasound was performed in the palpable area of concern at 4 o'clock 6 cm from the nipple. There is mild underlying skin thickening, and heterogeneously echogenic fat, including a few small cystic structures which likely reflect oil cysts. Two large, partially calcified cystic  lesions correlate with the stable, chronic oil cysts. IMPRESSION: 1. RIGHT breast palpable abnormality correlates with probably benign suspected acute on chronic fat necrosis. A three-month follow-up RIGHT breast diagnostic mammogram and ultrasound is recommended to confirm improvement/resolution, given lack of recent trauma history to explain fat necrosis. 2.  No mammographic evidence of malignancy in the LEFT breast. RECOMMENDATION: Three-month follow-up diagnostic mammogram and diagnostic ultrasound of the RIGHT breast I have discussed the findings and recommendations with the patient. If applicable, a reminder letter will be sent to the patient regarding the next appointment. BI-RADS CATEGORY  3: Probably benign. Electronically Signed   By: Norleen Croak M.D.   On: 04/28/2024 10:10       Assessment & Plan:  Routine general medical examination at a health care facility  Health care maintenance Assessment & Plan: Physical today 06/22/24.   Mammogram 07/09/23 - Birads I. Had bilateral diagnostic mammogram 04/2024 - Birads III. Recommended f/u right breast mammogram and ultrasound - ordered.   cologuard 03/22/23 - negative.    Pure hypercholesterolemia Assessment & Plan: Continues on lipitor. Low cholesterol diet and exercise. Follow lipid panel.  Lab Results  Component Value Date   CHOL 168 04/18/2024   HDL 44.20 04/18/2024   LDLCALC 106 (H) 04/18/2024   LDLDIRECT 126.0 05/02/2022   TRIG 90.0 04/18/2024   CHOLHDL 4 04/18/2024     Orders: -     Hemoglobin A1c; Future -     Hepatic function panel; Future -     Lipid panel; Future -     Basic metabolic panel with GFR; Future  Type 2 diabetes mellitus with hyperglycemia, without long-term current use of insulin (HCC) Assessment & Plan: Continue low carb diet and exercise. Continues on metformin . Follow met b and A1c.  Lab Results  Component Value Date   HGBA1C 6.2 04/18/2024     Orders: -     Hemoglobin A1c; Future -     Hepatic  function panel; Future -     Lipid panel; Future -     Basic metabolic panel with GFR; Future -     Microalbumin / creatinine urine ratio; Future  Essential (primary) hypertension Assessment & Plan: Continue lisinopril . Blood pressure as outlined. Follow pressures. Follow metabolic panel.   Orders: -     Hemoglobin A1c; Future -     Hepatic function panel; Future -     Lipid panel; Future -     Basic metabolic panel with GFR; Future  Anemia, unspecified type Assessment & Plan: Check cbc.   Orders: -     CBC with Differential/Platelet; Future -     TSH; Future  Abnormal mammogram -     MM 3D DIAGNOSTIC MAMMOGRAM UNILATERAL RIGHT BREAST; Future -     US  LIMITED ULTRASOUND INCLUDING AXILLA RIGHT BREAST; Future  Vitamin D  deficiency Assessment & Plan: Check vitamin d  level with next labs.  Orders: -     VITAMIN D  25 Hydroxy (Vit-D Deficiency, Fractures); Future  Renal cyst Assessment & Plan: Saw urology (Dr Penne) 06/15/20.  Note reviewed.  Recommended f/u renal ultrasound in 2 years.  Had abdominal ultrasound 2022 - stable cyst.  Had f/u renal ultrasound 06/2023 - septted cyst. Had f/u with Dr  Penne 12/2023 - no further w/up warranted.    Fire ant bite, undetermined intent, subsequent encounter Assessment & Plan: S/p bites - fire ants. Triamcinolone cream as directed. Call with update.    Other orders -     Sertraline  HCl; Take 1 tablet (25 mg total) by mouth daily.  Dispense: 90 tablet; Refill: 1 -     Triamcinolone Acetonide; Apply 1 Application topically 2 (two) times daily.  Dispense: 30 g; Refill: 0     Allena Hamilton, MD

## 2024-06-22 NOTE — Assessment & Plan Note (Signed)
 Physical today 06/22/24.   Mammogram 07/09/23 - Birads I. Had bilateral diagnostic mammogram 04/2024 - Birads III. Recommended f/u right breast mammogram and ultrasound - ordered.   cologuard 03/22/23 - negative.

## 2024-06-26 ENCOUNTER — Encounter: Payer: Self-pay | Admitting: Internal Medicine

## 2024-06-26 DIAGNOSIS — T63421A Toxic effect of venom of ants, accidental (unintentional), initial encounter: Secondary | ICD-10-CM | POA: Insufficient documentation

## 2024-06-26 NOTE — Assessment & Plan Note (Signed)
 Saw urology (Dr Penne) 06/15/20.  Note reviewed.  Recommended f/u renal ultrasound in 2 years.  Had abdominal ultrasound 2022 - stable cyst.  Had f/u renal ultrasound 06/2023 - septted cyst. Had f/u with Dr  Penne 12/2023 - no further w/up warranted.

## 2024-06-26 NOTE — Assessment & Plan Note (Signed)
 Continues on lipitor. Low cholesterol diet and exercise. Follow lipid panel.  Lab Results  Component Value Date   CHOL 168 04/18/2024   HDL 44.20 04/18/2024   LDLCALC 106 (H) 04/18/2024   LDLDIRECT 126.0 05/02/2022   TRIG 90.0 04/18/2024   CHOLHDL 4 04/18/2024

## 2024-06-26 NOTE — Assessment & Plan Note (Signed)
Continue lisinopril. Blood pressure as outlined.  Follow pressures.  Follow metabolic panel.  °

## 2024-06-26 NOTE — Assessment & Plan Note (Signed)
 S/p bites - fire ants. Triamcinolone cream as directed. Call with update.

## 2024-06-26 NOTE — Assessment & Plan Note (Signed)
 Check cbc

## 2024-06-26 NOTE — Assessment & Plan Note (Signed)
 Continue low carb diet and exercise. Continues on metformin . Follow met b and A1c.  Lab Results  Component Value Date   HGBA1C 6.2 04/18/2024

## 2024-06-26 NOTE — Assessment & Plan Note (Signed)
Check vitamin d level with next labs.  

## 2024-08-02 ENCOUNTER — Ambulatory Visit
Admission: RE | Admit: 2024-08-02 | Discharge: 2024-08-02 | Disposition: A | Discharge status: Home / Self Care | Source: Ambulatory Visit | Attending: Internal Medicine | Admitting: Internal Medicine

## 2024-08-02 ENCOUNTER — Ambulatory Visit (INDEPENDENT_AMBULATORY_CARE_PROVIDER_SITE_OTHER)

## 2024-08-02 DIAGNOSIS — R928 Other abnormal and inconclusive findings on diagnostic imaging of breast: Secondary | ICD-10-CM | POA: Diagnosis present

## 2024-08-02 DIAGNOSIS — Z23 Encounter for immunization: Secondary | ICD-10-CM

## 2024-08-02 NOTE — Progress Notes (Signed)
 Pt received Flu shot injection in right  deltoid muscle. Pt tolerated it well with no complaints or concerns.

## 2024-08-03 ENCOUNTER — Ambulatory Visit: Payer: Self-pay | Admitting: Internal Medicine

## 2024-08-30 ENCOUNTER — Ambulatory Visit (INDEPENDENT_AMBULATORY_CARE_PROVIDER_SITE_OTHER)

## 2024-08-30 ENCOUNTER — Ambulatory Visit: Payer: Self-pay | Admitting: Family

## 2024-08-30 ENCOUNTER — Encounter: Payer: Self-pay | Admitting: Family

## 2024-08-30 ENCOUNTER — Ambulatory Visit: Admitting: Family

## 2024-08-30 VITALS — BP 132/76 | HR 76 | Temp 98.1°F | Ht 64.0 in | Wt 198.6 lb

## 2024-08-30 DIAGNOSIS — D649 Anemia, unspecified: Secondary | ICD-10-CM

## 2024-08-30 DIAGNOSIS — I10 Essential (primary) hypertension: Secondary | ICD-10-CM

## 2024-08-30 DIAGNOSIS — E1165 Type 2 diabetes mellitus with hyperglycemia: Secondary | ICD-10-CM

## 2024-08-30 DIAGNOSIS — E559 Vitamin D deficiency, unspecified: Secondary | ICD-10-CM

## 2024-08-30 DIAGNOSIS — J40 Bronchitis, not specified as acute or chronic: Secondary | ICD-10-CM

## 2024-08-30 DIAGNOSIS — E7800 Pure hypercholesterolemia, unspecified: Secondary | ICD-10-CM

## 2024-08-30 LAB — CBC WITH DIFFERENTIAL/PLATELET
Basophils Absolute: 0.1 K/uL (ref 0.0–0.1)
Basophils Relative: 1.1 % (ref 0.0–3.0)
Eosinophils Absolute: 0.4 K/uL (ref 0.0–0.7)
Eosinophils Relative: 8.1 % — ABNORMAL HIGH (ref 0.0–5.0)
HCT: 43.4 % (ref 36.0–46.0)
Hemoglobin: 14.4 g/dL (ref 12.0–15.0)
Lymphocytes Relative: 21.4 % (ref 12.0–46.0)
Lymphs Abs: 1.1 K/uL (ref 0.7–4.0)
MCHC: 33.1 g/dL (ref 30.0–36.0)
MCV: 81 fl (ref 78.0–100.0)
Monocytes Absolute: 0.4 K/uL (ref 0.1–1.0)
Monocytes Relative: 7.5 % (ref 3.0–12.0)
Neutro Abs: 3.1 K/uL (ref 1.4–7.7)
Neutrophils Relative %: 61.9 % (ref 43.0–77.0)
Platelets: 276 K/uL (ref 150.0–400.0)
RBC: 5.36 Mil/uL — ABNORMAL HIGH (ref 3.87–5.11)
RDW: 14 % (ref 11.5–15.5)
WBC: 5.1 K/uL (ref 4.0–10.5)

## 2024-08-30 LAB — LIPID PANEL
Cholesterol: 217 mg/dL — ABNORMAL HIGH (ref 28–200)
HDL: 45.1 mg/dL (ref >39.00)
LDL Cholesterol: 140 mg/dL — ABNORMAL HIGH (ref 10–99)
NonHDL: 171.65
Total CHOL/HDL Ratio: 5
Triglycerides: 160 mg/dL — ABNORMAL HIGH (ref 10.0–149.0)
VLDL: 32 mg/dL (ref 0.0–40.0)

## 2024-08-30 LAB — HEPATIC FUNCTION PANEL
ALT: 30 U/L (ref 3–35)
AST: 23 U/L (ref 5–37)
Albumin: 4.7 g/dL (ref 3.5–5.2)
Alkaline Phosphatase: 85 U/L (ref 39–117)
Bilirubin, Direct: 0.1 mg/dL (ref 0.1–0.3)
Total Bilirubin: 0.5 mg/dL (ref 0.2–1.2)
Total Protein: 7.3 g/dL (ref 6.0–8.3)

## 2024-08-30 LAB — BASIC METABOLIC PANEL WITH GFR
BUN: 19 mg/dL (ref 6–23)
CO2: 29 meq/L (ref 19–32)
Calcium: 9.9 mg/dL (ref 8.4–10.5)
Chloride: 99 meq/L (ref 96–112)
Creatinine, Ser: 0.84 mg/dL (ref 0.40–1.20)
GFR: 73.76 mL/min (ref >60.00)
Glucose, Bld: 121 mg/dL — ABNORMAL HIGH (ref 70–99)
Potassium: 4.7 meq/L (ref 3.5–5.1)
Sodium: 137 meq/L (ref 135–145)

## 2024-08-30 LAB — TSH: TSH: 0.17 u[IU]/mL — ABNORMAL LOW (ref 0.35–5.50)

## 2024-08-30 LAB — MICROALBUMIN / CREATININE URINE RATIO
Creatinine,U: 31.5 mg/dL
Microalb Creat Ratio: UNDETERMINED mg/g (ref 0.0–30.0)
Microalb, Ur: <0.7 mg/dL

## 2024-08-30 LAB — HEMOGLOBIN A1C: Hgb A1c MFr Bld: 6 % (ref 4.6–6.5)

## 2024-08-30 LAB — VITAMIN D 25 HYDROXY (VIT D DEFICIENCY, FRACTURES): VITD: 25.28 ng/mL — ABNORMAL LOW (ref 30.00–100.00)

## 2024-08-30 NOTE — Patient Instructions (Signed)
 As discussed, more likely to be viral bronchitis.  Obtain chest x-ray to exclude pneumonia.    As long as symptoms continue to improve, stay hydrated and Mucinex is fine to continue.    I would also recommend scheduling your albuterol  inhaler during the daytime every 6 hours for the next couple of days.    As discussed, if cough persists or symptoms were to worsen, please let me know right away as I would recommend starting antibiotic.

## 2024-08-30 NOTE — Progress Notes (Signed)
 Assessment & Plan:  Bronchitis Assessment & Plan: No acute respiratory distress.  She is afebrile.  Over the course of 8 days, symptoms have improved.  We discussed at length the presentation most consistent with viral bronchitis.  Due to duration of symptoms, age and prior history we opted to obtain chest x-ray to exclude pneumonia.  Advised over-the-counter Mucinex, hydration and albuterol  inhaler.  Counseled patient if symptoms were to worsen or persist to let me know as I would recommend starting azithromycin.  Orders: -     DG Chest 2 View; Future  Vitamin D  deficiency -     VITAMIN D  25 Hydroxy (Vit-D Deficiency, Fractures)  Type 2 diabetes mellitus with hyperglycemia, without long-term current use of insulin (HCC) -     Microalbumin / creatinine urine ratio -     Basic metabolic panel with GFR -     Lipid panel -     Hepatic function panel -     Hemoglobin A1c  Anemia, unspecified type -     TSH -     CBC with Differential/Platelet  Pure hypercholesterolemia -     Basic metabolic panel with GFR -     Lipid panel -     Hepatic function panel -     Hemoglobin A1c  Essential (primary) hypertension -     Basic metabolic panel with GFR -     Lipid panel -     Hepatic function panel -     Hemoglobin A1c     Return precautions given.   Risks, benefits, and alternatives of the medications and treatment plan prescribed today were discussed, and patient expressed understanding.   Education regarding symptom management and diagnosis given to patient on AVS either electronically or printed.  No follow-ups on file.  Rollene Northern, FNP  Subjective:    [name], female    DOB: [birth year], 63 y.o.   MRN: [id number]  CC: Carly Fields is a 63 y.o. female who presents today for an acute visit for cough x 8 days, improved.    HPI: HPI Discussed the use of AI scribe software for clinical note transcription with the patient, who gave verbal consent to  proceed.  History of Present Illness   Carly Fields is a 63 year old female who presents with an 8-day history of cough.  She has been experiencing a productive cough for 8 days, improved.  Initially, it was accompanied by nasal congestion, which she managed with Mucinex Sinus, providing some relief after 24 hours. She continues to experience nasal congestion and has switched to using plain Mucinex. She has not used Mucinex DM or any cough suppressants. She is not coughing at bedtime.   Last week, she experienced a fever with a maximum temperature of 100.61F, which resolved by Friday or Saturday. She reports wheezing, which she perceives as more in her throat than her chest.  She mentions soreness in her back, attributed to coughing, but notes it is not currently present today.   Denies sinus or ear pain, but she has experienced pressure in her ears due to mucus. She has not used Flonase but did use Afrin once last week without noticeable effect.      Fever tmax 100.4,  last  4 days ago  History of diabetes, hypertension. Rare alcohol Non smoker  No h/o ckd Questions if had asthma as a child   Allergies: Penicillins and Tetanus toxoid-containing vaccines Medications Ordered Prior to  Encounter[1]  Review of Systems  Constitutional:  Negative for chills and fever.  HENT:  Positive for congestion.   Respiratory:  Positive for cough. Negative for shortness of breath and wheezing.   Cardiovascular:  Negative for chest pain and palpitations.  Gastrointestinal:  Negative for nausea and vomiting.      Objective:    BP 132/76   Pulse 76   Temp 98.1 F (36.7 C) (Oral)   Ht 5' 4 (1.626 m)   Wt 198 lb 9.6 oz (90.1 kg)   SpO2 98%   BMI 34.09 kg/m   BP Readings from Last 3 Encounters:  08/30/24 132/76  06/22/24 126/78  04/26/24 130/70   Wt Readings from Last 3 Encounters:  08/30/24 198 lb 9.6 oz (90.1 kg)  06/22/24 194 lb 12.8 oz (88.4 kg)  04/26/24 193 lb 9.6 oz  (87.8 kg)    Physical Exam Vitals reviewed.  Constitutional:      Appearance: She is well-developed.  HENT:     Head: Normocephalic and atraumatic.     Right Ear: Hearing, tympanic membrane, ear canal and external ear normal. No decreased hearing noted. No drainage, swelling or tenderness. No middle ear effusion. No foreign body. Tympanic membrane is not erythematous or bulging.     Left Ear: Hearing, tympanic membrane, ear canal and external ear normal. No decreased hearing noted. No drainage, swelling or tenderness.  No middle ear effusion. No foreign body. Tympanic membrane is not erythematous or bulging.     Nose: Nose normal. No rhinorrhea.     Right Sinus: No maxillary sinus tenderness or frontal sinus tenderness.     Left Sinus: No maxillary sinus tenderness or frontal sinus tenderness.     Mouth/Throat:     Pharynx: Uvula midline. No oropharyngeal exudate or posterior oropharyngeal erythema.     Tonsils: No tonsillar abscesses.  Eyes:     Conjunctiva/sclera: Conjunctivae normal.  Cardiovascular:     Rate and Rhythm: Regular rhythm.     Pulses: Normal pulses.     Heart sounds: Normal heart sounds.  Pulmonary:     Effort: Pulmonary effort is normal.     Breath sounds: Normal breath sounds. No wheezing, rhonchi or rales.     Comments: Coarse lung sounds when asked to cough diffusely across BL lung fields Musculoskeletal:     Thoracic back: No swelling, edema, deformity or signs of trauma. Normal range of motion. No scoliosis.     Comments: No back pain today. No back pain when asked to cough.  No rash, bony step, edema.   Lymphadenopathy:     Head:     Right side of head: No submental, submandibular, tonsillar, preauricular, posterior auricular or occipital adenopathy.     Left side of head: No submental, submandibular, tonsillar, preauricular, posterior auricular or occipital adenopathy.     Cervical: No cervical adenopathy.  Skin:    General: Skin is warm and dry.   Neurological:     Mental Status: She is alert.  Psychiatric:        Speech: Speech normal.        Behavior: Behavior normal.        Thought Content: Thought content normal.          [1]  Current Outpatient Medications on File Prior to Visit  Medication Sig Dispense Refill   albuterol  (VENTOLIN  HFA) 108 (90 Base) MCG/ACT inhaler Inhale 2 puffs into the lungs every 6 (six) hours as needed for wheezing or shortness of breath.  1 each 1   amphetamine-dextroamphetamine (ADDERALL) 10 MG tablet Take 10 mg by mouth daily. (Patient taking differently: Take 10 mg by mouth daily. Pt is taking 15 mg instead of 10 mg per 08/29/24)     atorvastatin  (LIPITOR) 10 MG tablet Take one tablet 5 days per week. 65 tablet 2   Biotin 1000 MCG CHEW Chew by mouth.     Cholecalciferol (VITAMIN D3) 1000 units CAPS Take 1 capsule by mouth daily.      cloNIDine (CATAPRES) 0.1 MG tablet Take by mouth daily.     Cyanocobalamin (B-12) 250 MCG TABS Take 250 mcg by mouth daily.     estradiol  (ESTRACE ) 0.1 MG/GM vaginal cream Estrogen Cream Instruction Discard applicator Apply pea sized amount to tip of finger to urethra before bed. Wash hands well after application. Use Monday, Wednesday and Friday 42.5 g 12   fluticasone (FLONASE) 50 MCG/ACT nasal spray Place 2 sprays into the nose as needed for rhinitis.     lisinopril  (ZESTRIL ) 20 MG tablet Take 1 tablet (20 mg total) by mouth daily. 90 tablet 3   loratadine (CLARITIN) 10 MG tablet Take 10 mg by mouth daily.     metFORMIN  (GLUCOPHAGE -XR) 500 MG 24 hr tablet Take 1 tablet (500 mg total) by mouth 2 (two) times daily. 180 tablet 3   sertraline  (ZOLOFT ) 25 MG tablet Take 1 tablet (25 mg total) by mouth daily. 90 tablet 1   triamcinolone  cream (KENALOG ) 0.1 % Apply 1 Application topically 2 (two) times daily. 30 g 0   vitamin E 400 UNIT capsule Take 400 Units by mouth daily.     No current facility-administered medications on file prior to visit.

## 2024-08-30 NOTE — Assessment & Plan Note (Signed)
 No acute respiratory distress.  She is afebrile.  Over the course of 8 days, symptoms have improved.  We discussed at length the presentation most consistent with viral bronchitis.  Due to duration of symptoms, age and prior history we opted to obtain chest x-ray to exclude pneumonia.  Advised over-the-counter Mucinex, hydration and albuterol  inhaler.  Counseled patient if symptoms were to worsen or persist to let me know as I would recommend starting azithromycin.

## 2024-08-31 ENCOUNTER — Ambulatory Visit (INDEPENDENT_AMBULATORY_CARE_PROVIDER_SITE_OTHER)

## 2024-08-31 ENCOUNTER — Other Ambulatory Visit: Payer: Self-pay | Admitting: Internal Medicine

## 2024-08-31 DIAGNOSIS — R7989 Other specified abnormal findings of blood chemistry: Secondary | ICD-10-CM

## 2024-08-31 LAB — T3, FREE: T3, Free: 3.6 pg/mL (ref 2.3–4.2)

## 2024-08-31 LAB — T4, FREE: Free T4: 0.73 ng/dL (ref 0.60–1.60)

## 2024-08-31 NOTE — Progress Notes (Signed)
 Order placed for add on labs (free T3 and free T4).

## 2024-09-01 ENCOUNTER — Ambulatory Visit: Payer: Self-pay | Admitting: Internal Medicine

## 2024-09-01 ENCOUNTER — Other Ambulatory Visit

## 2024-09-02 ENCOUNTER — Telehealth: Admitting: Internal Medicine

## 2024-09-02 ENCOUNTER — Encounter: Payer: Self-pay | Admitting: Internal Medicine

## 2024-09-02 VITALS — Ht 64.0 in | Wt 198.0 lb

## 2024-09-02 DIAGNOSIS — I10 Essential (primary) hypertension: Secondary | ICD-10-CM | POA: Diagnosis not present

## 2024-09-02 DIAGNOSIS — F339 Major depressive disorder, recurrent, unspecified: Secondary | ICD-10-CM

## 2024-09-02 DIAGNOSIS — N281 Cyst of kidney, acquired: Secondary | ICD-10-CM

## 2024-09-02 DIAGNOSIS — E1165 Type 2 diabetes mellitus with hyperglycemia: Secondary | ICD-10-CM | POA: Diagnosis not present

## 2024-09-02 DIAGNOSIS — J40 Bronchitis, not specified as acute or chronic: Secondary | ICD-10-CM | POA: Diagnosis not present

## 2024-09-02 DIAGNOSIS — Z7984 Long term (current) use of oral hypoglycemic drugs: Secondary | ICD-10-CM | POA: Diagnosis not present

## 2024-09-02 DIAGNOSIS — E559 Vitamin D deficiency, unspecified: Secondary | ICD-10-CM | POA: Diagnosis not present

## 2024-09-02 DIAGNOSIS — E7800 Pure hypercholesterolemia, unspecified: Secondary | ICD-10-CM

## 2024-09-02 MED ORDER — DOXYCYCLINE HYCLATE 100 MG PO TABS: 100.0000 mg | ORAL_TABLET | Freq: Two times a day (BID) | ORAL | 0 refills | Status: AC

## 2024-09-02 MED ORDER — PREDNISONE 10 MG PO TABS: ORAL_TABLET | ORAL | 0 refills | Status: AC

## 2024-09-02 MED ORDER — LISINOPRIL 20 MG PO TABS: 20.0000 mg | ORAL_TABLET | Freq: Every day | ORAL | 3 refills | Status: AC

## 2024-09-02 NOTE — Assessment & Plan Note (Signed)
 Continues on lipitor. Low cholesterol diet and exercise. Follow lipid panel.  Lab Results  Component Value Date   CHOL 217 (H) 08/30/2024   HDL 45.10 08/30/2024   LDLCALC 140 (H) 08/30/2024   LDLDIRECT 126.0 05/02/2022   TRIG 160.0 (H) 08/30/2024   CHOLHDL 5 08/30/2024

## 2024-09-02 NOTE — Assessment & Plan Note (Signed)
 Vitamin D  level just checked - 25.

## 2024-09-02 NOTE — Assessment & Plan Note (Signed)
 Saw urology (Dr Penne) 06/15/20.  Note reviewed.  Recommended f/u renal ultrasound in 2 years.  Had abdominal ultrasound 2022 - stable cyst.  Had f/u renal ultrasound 06/2023 - septted cyst. Had f/u with Dr  Penne 12/2023 - no further w/up warranted.

## 2024-09-02 NOTE — Progress Notes (Signed)
 [name], female   DOB: 04-23-1961, 63 y.o.   MRN: [id number]   Virtual Visit via video Note  I connected with Carly Fields by a video enabled telemedicine application or telephone and verified that I am speaking with the correct person using two identifiers. Location patient: home Location provider: work  Persons participating in the virtual visit: patient, provider  The limitations, risks, security and privacy concerns of performing an evaluation and management service by video and the availability of in person appointments have been discussed. It has also been discussed with the patient that there may be a patient responsible charge related to this service. The patient expressed understanding and agreed to proceed.   Reason for visit: follow up appt  HPI: Follow up regarding increased stress. Seeing a therapist. Also follow up regarding hypercholesterolemia and hypertension. Continues on zoloft  and adderall. Saw urology 12/24/23 - f/u renal cyst. Per urology - no further evaluation or f/u is recommended. Also prescribed estrogen cream and E-Ring - for vaginal dryness. Recently evaluated and diagnosed with bronchitis. Treated with mucinex and albuterol  inhaler. CXR- no acute abnormality. She did have thoracic spine degenerative changes, including changes of DISH. Lumbar spine - degenerative changes. Reported that symptoms started approximately 10 days ago. Increased sneezing and nasal congestion. Sore throat. Previous fever - 100.4. no sinus pressure. No sob. Some decreased energy. No vomiting or diarrhea. Persistent increased cough. Discussed mucinex.    ROS: See pertinent positives and negatives per HPI.  Past Medical History:  Diagnosis Date   Abnormal liver function    Allergy 63 years old   Anemia    Anxiety    at times I can be very anxious   Chronic kidney disease 5 years ago   cyst in kidney, discovered during ultrasound   Depression I'm not sure, was very young    Diabetes mellitus without complication (HCC) several years ago   discovered during labwork   GERD (gastroesophageal reflux disease)    Heart murmur    Hypercholesterolemia    Hyperglycemia    Hypertension     Past Surgical History:  Procedure Laterality Date   ABDOMINAL HYSTERECTOMY  2010   Partial hysterectomy   ACHILLES TENDON REPAIR  2009   BREAST BIOPSY Left 07/30/2006   benign   BREAST BIOPSY Left 02/18/2007 & 03/05/2007   benign   BREAST EXCISIONAL BIOPSY     BREAST SURGERY  2008   LUMBAR MICRODISCECTOMY  2004   ROTATOR CUFF REPAIR  2006   SEPTOPLASTY  1990   SPINE SURGERY  2004   Microdiscectomy L5-L6   TONSILLECTOMY AND ADENOIDECTOMY  1969    Family History  Problem Relation Age of Onset   Hyperlipidemia Mother    Hypertension Mother    Stroke Mother    Heart disease Mother    Hypothyroidism Mother    Pernicious anemia Mother        aunt x 2   Anxiety disorder Mother    Depression Mother    Hearing loss Mother    Lung cancer Father        Hx smoker   Cancer Father    Ovarian cancer Maternal Aunt    Cancer Maternal Aunt    Depression Maternal Aunt    Cancer Maternal Aunt    Heart disease Maternal Aunt    Miscarriages / Stillbirths Maternal Aunt    Cancer Maternal Aunt    Heart disease Maternal Aunt    Heart disease Maternal Aunt  Hearing loss Maternal Aunt    Learning disabilities Maternal Aunt    Diabetes Maternal Uncle    COPD Maternal Uncle    Diabetes Maternal Uncle    Kidney disease Maternal Uncle    Stroke Maternal Uncle    Heart disease Maternal Uncle    Breast cancer Paternal Aunt    Cancer Paternal Aunt    Diabetes Paternal Aunt    Varicose Veins Paternal Aunt    Hyperlipidemia Maternal Grandmother    Ulcers Maternal Grandmother    Arthritis Maternal Grandmother    Heart disease Maternal Grandmother    Hypertension Maternal Grandmother    Hypertension Maternal Grandfather        MI   Heart disease Maternal Grandfather         myocardial infarction   Kidney cancer Paternal Grandmother    Diabetes Paternal Grandfather    Breast cancer Cousin    Diabetes Other    Hypothyroidism Other        aunt x 2   Colon cancer Neg Hx     SOCIAL HX: reviewed.   Current Medications[1]  EXAM:  GENERAL: alert, oriented, no acute distress  HEENT: atraumatic, conjunttiva clear, no obvious abnormalities on inspection of external nose and ears  NECK: normal movements of the head and neck  LUNGS: on inspection no signs of respiratory distress, breathing rate appears normal, no obvious gross SOB, gasping or wheezing. Increased cough with increased talking and forced expiration.   CV: no obvious cyanosis  PSYCH/NEURO: pleasant and cooperative, no obvious depression or anxiety, speech and thought processing grossly intact  ASSESSMENT AND PLAN:  Discussed the following assessment and plan:  Problem List Items Addressed This Visit     Vitamin D  deficiency - Primary   Vitamin D  level just checked - 25.       Type 2 diabetes mellitus with hyperglycemia (HCC)   Continue low carb diet and exercise. Continues on metformin . Follow met b and A1c.  Lab Results  Component Value Date   HGBA1C 6.0 08/30/2024         Relevant Medications   lisinopril  (ZESTRIL ) 20 MG tablet   Renal cyst   Saw urology (Dr Penne) 06/15/20.  Note reviewed.  Recommended f/u renal ultrasound in 2 years.  Had abdominal ultrasound 2022 - stable cyst.  Had f/u renal ultrasound 06/2023 - septted cyst. Had f/u with Dr  Penne 12/2023 - no further w/up warranted.       Pure hypercholesterolemia   Continues on lipitor. Low cholesterol diet and exercise. Follow lipid panel.  Lab Results  Component Value Date   CHOL 217 (H) 08/30/2024   HDL 45.10 08/30/2024   LDLCALC 140 (H) 08/30/2024   LDLDIRECT 126.0 05/02/2022   TRIG 160.0 (H) 08/30/2024   CHOLHDL 5 08/30/2024         Relevant Medications   lisinopril  (ZESTRIL ) 20 MG tablet   Essential  (primary) hypertension   Continue lisinopril . Follow pressures. Follow metabolic panel.       Relevant Medications   lisinopril  (ZESTRIL ) 20 MG tablet   Depression, recurrent   Overall stable. Continue zoloft .       Bronchitis   Increased cough and congestion as outlined. Persistent. Will treat with doxycycline  and prednisone  taper as directed. Continue mucinex. Saline nasal spray and steroid nasal spray. Albuterol  if needed. Follow.        Return in about 3 months (around 12/01/2024) for follow-up with fasting labs 2 days prior. schdule fasting labs  after 12/01/24. .   I discussed the assessment and treatment plan with the patient. The patient was provided an opportunity to ask questions and all were answered. The patient agreed with the plan and demonstrated an understanding of the instructions.   The patient was advised to call back or seek an in-person evaluation if the symptoms worsen or if the condition fails to improve as anticipated.    Allena Hamilton, MD       [1]  Current Outpatient Medications:    albuterol  (VENTOLIN  HFA) 108 (90 Base) MCG/ACT inhaler, Inhale 2 puffs into the lungs every 6 (six) hours as needed for wheezing or shortness of breath., Disp: 1 each, Rfl: 1   amphetamine-dextroamphetamine (ADDERALL) 10 MG tablet, Take 10 mg by mouth daily. (Patient taking differently: Take 10 mg by mouth daily. Pt is taking 15 mg instead of 10 mg per 08/29/24), Disp: , Rfl:    atorvastatin  (LIPITOR) 10 MG tablet, Take one tablet 5 days per week., Disp: 65 tablet, Rfl: 2   Biotin 1000 MCG CHEW, Chew by mouth., Disp: , Rfl:    Cholecalciferol (VITAMIN D3) 1000 units CAPS, Take 1 capsule by mouth daily. , Disp: , Rfl:    cloNIDine (CATAPRES) 0.1 MG tablet, Take by mouth daily., Disp: , Rfl:    Cyanocobalamin (B-12) 250 MCG TABS, Take 250 mcg by mouth daily., Disp: , Rfl:    doxycycline  (VIBRA -TABS) 100 MG tablet, Take 1 tablet (100 mg total) by mouth 2 (two) times daily., Disp:  14 tablet, Rfl: 0   estradiol  (ESTRACE ) 0.1 MG/GM vaginal cream, Estrogen Cream Instruction Discard applicator Apply pea sized amount to tip of finger to urethra before bed. Wash hands well after application. Use Monday, Wednesday and Friday, Disp: 42.5 g, Rfl: 12   fluticasone (FLONASE) 50 MCG/ACT nasal spray, Place 2 sprays into the nose as needed for rhinitis., Disp: , Rfl:    loratadine (CLARITIN) 10 MG tablet, Take 10 mg by mouth daily., Disp: , Rfl:    metFORMIN  (GLUCOPHAGE -XR) 500 MG 24 hr tablet, Take 1 tablet (500 mg total) by mouth 2 (two) times daily., Disp: 180 tablet, Rfl: 3   predniSONE  (DELTASONE ) 10 MG tablet, Take 4 tablets x 1 day and then decrease by 1/2 tablet per day until down to zero mg., Disp: 18 tablet, Rfl: 0   sertraline  (ZOLOFT ) 25 MG tablet, Take 1 tablet (25 mg total) by mouth daily., Disp: 90 tablet, Rfl: 1   triamcinolone  cream (KENALOG ) 0.1 %, Apply 1 Application topically 2 (two) times daily., Disp: 30 g, Rfl: 0   vitamin E 400 UNIT capsule, Take 400 Units by mouth daily., Disp: , Rfl:    lisinopril  (ZESTRIL ) 20 MG tablet, Take 1 tablet (20 mg total) by mouth daily., Disp: 90 tablet, Rfl: 3

## 2024-09-02 NOTE — Assessment & Plan Note (Signed)
 Continue low carb diet and exercise. Continues on metformin . Follow met b and A1c.  Lab Results  Component Value Date   HGBA1C 6.0 08/30/2024

## 2024-09-05 ENCOUNTER — Ambulatory Visit: Admitting: Internal Medicine

## 2024-09-11 NOTE — Assessment & Plan Note (Signed)
 Continue lisinopril.  Follow pressures.  Follow metabolic panel.

## 2024-09-11 NOTE — Assessment & Plan Note (Signed)
 Increased cough and congestion as outlined. Persistent. Will treat with doxycycline  and prednisone  taper as directed. Continue mucinex. Saline nasal spray and steroid nasal spray. Albuterol  if needed. Follow.

## 2024-09-11 NOTE — Assessment & Plan Note (Signed)
 Overall stable. Continue zoloft .

## 2024-09-13 ENCOUNTER — Telehealth (HOSPITAL_COMMUNITY): Payer: Self-pay | Admitting: Psychiatry

## 2024-09-13 NOTE — Telephone Encounter (Signed)
 D:  Patient's therapist Ilah Fischer) referred her to virtual MH-IOP.  A:  Placed call to pt.  Oriented pt and answered all her questions.  Pt states she is very interested but has to talk to her manager at work b/c there's some important meetings coming up.  Encouraged pt to call the case manager back after she speaks to her mgr; so we can get a CCA scheduled for next week and she possibly start after the important mtg next Wednesday.  Inform Mliss Fischer, LCSW.  R:  Pt receptive.

## 2024-10-14 ENCOUNTER — Other Ambulatory Visit: Payer: Self-pay

## 2024-10-14 DIAGNOSIS — D649 Anemia, unspecified: Secondary | ICD-10-CM

## 2024-10-14 DIAGNOSIS — R7989 Other specified abnormal findings of blood chemistry: Secondary | ICD-10-CM

## 2024-10-17 ENCOUNTER — Other Ambulatory Visit

## 2024-12-06 ENCOUNTER — Other Ambulatory Visit

## 2024-12-08 ENCOUNTER — Ambulatory Visit: Admitting: Internal Medicine
